# Patient Record
Sex: Female | Born: 1990
Health system: Southern US, Community
[De-identification: ages and names within clinical notes are randomized; demographics above are authoritative.]

## PROBLEM LIST (undated history)

## (undated) DIAGNOSIS — I10 Essential (primary) hypertension: Secondary | ICD-10-CM

## (undated) DIAGNOSIS — J302 Other seasonal allergic rhinitis: Secondary | ICD-10-CM

## (undated) DIAGNOSIS — T7840XA Allergy, unspecified, initial encounter: Secondary | ICD-10-CM

## (undated) DIAGNOSIS — F419 Anxiety disorder, unspecified: Secondary | ICD-10-CM

## (undated) DIAGNOSIS — F509 Eating disorder, unspecified: Secondary | ICD-10-CM

## (undated) DIAGNOSIS — L309 Dermatitis, unspecified: Secondary | ICD-10-CM

## (undated) DIAGNOSIS — R636 Underweight: Secondary | ICD-10-CM

## (undated) DIAGNOSIS — U071 COVID-19: Secondary | ICD-10-CM

## (undated) DIAGNOSIS — L709 Acne, unspecified: Secondary | ICD-10-CM

## (undated) DIAGNOSIS — N912 Amenorrhea, unspecified: Secondary | ICD-10-CM

## (undated) HISTORY — DX: Eating disorder, unspecified: F50.9

## (undated) HISTORY — DX: Other seasonal allergic rhinitis: J30.2

## (undated) HISTORY — DX: Allergy, unspecified, initial encounter: T78.40XA

## (undated) HISTORY — PX: WISDOM TOOTH EXTRACTION: SHX21

## (undated) HISTORY — DX: COVID-19: U07.1

## (undated) HISTORY — DX: Acne, unspecified: L70.9

## (undated) HISTORY — DX: Dermatitis, unspecified: L30.9

## (undated) HISTORY — DX: Underweight: R63.6

## (undated) HISTORY — DX: Anxiety disorder, unspecified: F41.9

## (undated) HISTORY — DX: Amenorrhea, unspecified: N91.2

## (undated) HISTORY — DX: Essential (primary) hypertension: I10

---
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
## 2009-01-24 ENCOUNTER — Telehealth: Payer: Self-pay | Admitting: Family Medicine

## 2009-02-26 ENCOUNTER — Telehealth: Payer: Self-pay | Admitting: Internal Medicine

## 2009-04-24 ENCOUNTER — Telehealth: Payer: Self-pay | Admitting: Family Medicine

## 2009-04-25 ENCOUNTER — Ambulatory Visit: Payer: Self-pay | Admitting: Internal Medicine

## 2009-06-30 ENCOUNTER — Telehealth: Payer: Self-pay | Admitting: Internal Medicine

## 2009-10-06 ENCOUNTER — Telehealth: Payer: Self-pay | Admitting: Family Medicine

## 2009-11-18 ENCOUNTER — Ambulatory Visit: Payer: Self-pay | Admitting: Internal Medicine

## 2009-11-18 ENCOUNTER — Encounter: Payer: Self-pay | Admitting: Family Medicine

## 2009-11-18 ENCOUNTER — Telehealth: Payer: Self-pay | Admitting: Family Medicine

## 2009-11-18 ENCOUNTER — Telehealth: Payer: Self-pay | Admitting: Internal Medicine

## 2009-11-20 LAB — CONVERTED CEMR LAB: Hepatitis B-Post: 84.7 milliintl units/mL

## 2010-01-19 ENCOUNTER — Encounter: Payer: Self-pay | Admitting: Internal Medicine

## 2010-01-20 ENCOUNTER — Encounter: Payer: Self-pay | Admitting: Family Medicine

## 2010-02-03 ENCOUNTER — Ambulatory Visit: Payer: Self-pay | Admitting: Family Medicine

## 2010-04-03 ENCOUNTER — Ambulatory Visit: Payer: Self-pay | Admitting: Family Medicine

## 2010-04-07 ENCOUNTER — Telehealth: Payer: Self-pay | Admitting: Internal Medicine

## 2010-04-25 ENCOUNTER — Telehealth: Payer: Self-pay | Admitting: Family Medicine

## 2010-07-02 ENCOUNTER — Ambulatory Visit: Payer: Self-pay | Admitting: Family Medicine

## 2010-08-11 NOTE — Progress Notes (Signed)
  Phone Note Call from Patient   Caller: Dad Call For: Kerby Nora MD Summary of Call: Patient is coming in today for blood work she needs titers for pharmacy school. He would like to know if you would like any other blood work done at that time also.m She has appt at 11 am Initial call taken by: Benny Lennert CMA Duncan Dull),  Nov 18, 2009 9:38 AM  Follow-up for Phone Call        No other rotuine labs needed at age 20 unless she is having symptoms/problem.  Follow-up by: Kerby Nora MD,  Nov 18, 2009 9:45 AM  Additional Follow-up for Phone Call Additional follow up Details #1::        okay will advise Additional Follow-up by: Benny Lennert CMA Duncan Dull),  Nov 18, 2009 9:45 AM

## 2010-08-11 NOTE — Miscellaneous (Signed)
Summary: PPD  Clinical Lists Changes  Orders: Added new Service order of TB Skin Test [phone]) - Signed Added new Service order of Admin 1st Vaccine (11914) - Signed Added new Service order of Admin 1st Vaccine [facility]) [phone]) - Signed Observations: Added new observation of TB-PPD LOT#: [id number] (01/19/2010 17:42) Added new observation of TB-PPD EXP: 04/24/2011 (01/19/2010 17:42) Added new observation of TB-PPD BY: Tillman Abide (01/19/2010 17:42) Added new observation of TB-PPD RTE: ID (01/19/2010 17:42) Added new observation of TB-PPD MFR: Sanofi Pasteur (01/19/2010 17:42) Added new observation of TB-PPD SITE: left forearm (01/19/2010 17:42) Added new observation of TB-PPD: PPD (01/19/2010 17:42)      Orders Added: 1)  TB Skin Test [86580] 2)  Admin 1st Vaccine [90471] 3)  Admin 1st Vaccine [facility]) [90471S]   PPD placed ub left forearm in evening July 12th Richard Dia Crawford MD  January 21, 2010 10:30 AM   PPD shows 0mm of induration at 48 hours Clearly a negative test Cindee Salt MD  January 22, 2010 10:25 PM

## 2010-08-11 NOTE — Progress Notes (Signed)
  Phone Note Other Incoming   Caller: Father Summary of Call: D/w patient father, has had some URI signs, but now has developed some low grade fever, chills, sinus pain.  Call in to [facility], [facility]. done  [place] - can you call to remind her that OCP's will not work while on Amox. Dr. Alphonsus Sias will have cell #.   Initial call taken by: Hannah Beat MD,  October 06, 2009 2:08 PM  Follow-up for Phone Call        Patient advised.Consuello Masse CMA  Follow-up by: Benny Lennert CMA Duncan Dull),  October 06, 2009 2:21 PM    New/Updated Medications: AMOXICILLIN 875 MG TABS (AMOXICILLIN) 1 by mouth two times a day Prescriptions: AMOXICILLIN 875 MG TABS (AMOXICILLIN) 1 by mouth two times a day  #20 x 0   Entered and Authorized by:   Hannah Beat MD   Signed by:   Hannah Beat MD on 10/06/2009   Method used:   [facility] ...       [facility]  [id number]       [address]       [place], [place]  [address]       Ph: [phone]       Fax: [phone]   RxID:   [id number]

## 2010-08-11 NOTE — Progress Notes (Signed)
Summary: PPD  Phone Note Outgoing Call   Call placed by: Mervin Hack CMA Duncan Dull),  Nov 18, 2009 1:20 PM Call placed to: Patient's father Summary of Call: PPD info for your daughter: date given 11/18/2009 lot# [id number] exp date 04/24/2011 manf: Sanofi Pasteur dose:0.1unit route: left forearm Initial call taken by: Mervin Hack CMA Duncan Dull),  Nov 18, 2009 1:21 PM  Follow-up for Phone Call        PPD negative 0mm of induration Follow-up by: Cindee Salt MD,  Nov 20, 2009 1:07 PM

## 2010-08-11 NOTE — Progress Notes (Signed)
  Phone Note Call from Patient   Caller: Patient Summary of Call: Dr Collins Callas put her on astepro she wants to change to generic astelin because astepro is too expensive Initial call taken by: Cindee Salt MD,  April 07, 2010 9:07 AM    New/Updated Medications: AZELASTINE HCL 137 MCG/SPRAY SOLN (AZELASTINE HCL) 1 spray each nostril two times a day Prescriptions: AZELASTINE HCL 137 MCG/SPRAY SOLN (AZELASTINE HCL) 1 spray each nostril two times a day  #1 x 11   Entered and Authorized by:   Cindee Salt MD   Signed by:   Cindee Salt MD on 04/07/2010   Method used:   Electronically to        [facility] [phone]* (retail)       [address]       [place], [place]  [address]       Ph: [phone]       Fax:    RxID:   [id number]

## 2010-08-11 NOTE — Consult Note (Signed)
Summary: [facility]  [facility]   Imported By: Lanelle Bal 01/28/2010 13:22:56  _____________________________________________________________________  External Attachment:    Type:   Image     Comment:   External Document

## 2010-08-11 NOTE — Progress Notes (Signed)
  Phone Note Call from Patient   Caller: Dad Summary of Call: Tamara Mills has some questions about her anxiety Actually better most of the time and she may do okay on lower dose of venlafaxine She has some anxiety spells that are bad at times---esp at night May be helped with xanax but she has  been out for some time  cell #  [phone] Initial call taken by: Cindee Salt MD,  April 25, 2010 11:43 AM  Follow-up for Phone Call        Was unable to call pt after work on 10/14 as initiallly requested. Heather ..please call pt to let her know I will contact her to discuss concerns today or tommorow, Follow-up by: Kerby Nora MD,  April 27, 2010 7:05 AM  Additional Follow-up for Phone Call Additional follow up Details #1::        Patient advised via message on personal cell phone Additional Follow-up by: Benny Lennert CMA Duncan Dull),  April 27, 2010 9:33 AM    Additional Follow-up for Phone Call Additional follow up Details #2::    Dry mouth SE to 150 of venlafaxine. Interested in acute short term med for anxiety..such as xanax. Helps her to know she has it around. She does not feel like she would use it more that a few times a week. Had considered other medications like wellbutrin.Marland Kitchenbut wants to hold off switching at this time. No SI, no HI.   Will lower venlafaxine to 75 mg daily. Recommended stresss reduction, relaxation techniques. disucssed temporary indication and tolerance concerns of xanax. If escalating use or using frequently will recommend trial of a different medicaiton. Instructed to follow up in 1 month if mood not improving.  Follow-up by: Kerby Nora MD,  April 28, 2010 1:42 PM  Additional Follow-up for Phone Call Additional follow up Details #3:: Details for Additional Follow-up Action Taken: rx called to pharmacy.Consuello Masse CMA   Additional Follow-up by: Benny Lennert CMA Duncan Dull),  April 28, 2010 1:46 PM  New/Updated Medications: VENLAFAXINE HCL 75  MG XR24H-CAP (VENLAFAXINE HCL) 1 tab by mouth daily Prescriptions: ALPRAZOLAM 0.25 MG TABS (ALPRAZOLAM) 1 tab by mouth q6 hurs as needed anxiety  #30 x 0   Entered and Authorized by:   Kerby Nora MD   Signed by:   Kerby Nora MD on 04/28/2010   Method used:   [facility]ph[facility] ...       [facility]  [id number] ([facility]       [address]       [place], [place]  [address]       Ph: [phone]       Fax: [phone]   RxID:   [id number] VENLAFAXINE HCL 75 MG XR24H-CAP (VENLAFAXINE HCL) 1 tab by mouth daily  #30 x 5   Entered and Authorized by:   Kerby Nora MD   Signed by:   Kerby Nora MD on 04/28/2010   Method used:   Telephoned to ...       [facility]  [id number] ([facility]ail)       [address]       [place], [place]  [address]       Ph: [phone]       Fax: [phone]   RxID:   [id number]

## 2010-08-11 NOTE — Assessment & Plan Note (Signed)
Summary: Tamara Mills FLU SHOT/RBH  Nurse Visit   Allergies: No Known Drug Allergies  Immunizations Administered:  Influenza Vaccine # 1:    Vaccine Type: Fluvax 3+    Site: left deltoid    Mfr: GlaxoSmithKline    Dose: 0.5 ml    Route: IM    Given by: Mervin Hack CMA (AAMA)    Exp. Date: 01/09/2011    Lot #: [id number]    VIS given: 02/03/10 version given April 03, 2010.  Flu Vaccine Consent Questions:    Do you have a history of severe allergic reactions to this vaccine? no    Any prior history of allergic reactions to egg and/or gelatin? no    Do you have a sensitivity to the preservative Thimersol? no    Do you have a past history of Guillan-Barre Syndrome? no    Do you currently have an acute febrile illness? no    Have you ever had a severe reaction to latex? no    Vaccine information given and explained to patient? yes    Are you currently pregnant? no  Orders Added: 1)  Flu Vaccine 9yrs + [90658] 2)  Admin 1st Vaccine [09811]

## 2010-08-13 NOTE — Assessment & Plan Note (Signed)
Summary: F/U/CLE   Vital Signs:  Patient profile:   20 year old female Height:      65.12 inches Weight:      128.75 pounds BMI:     21.42 Temp:     98.1 degrees F oral Pulse rate:   60 / minute Pulse rhythm:   regular BP sitting:   100 / 72  (left arm) Cuff size:   regular  Vitals Entered By: Lewanda Rife LPN (July 14, 2010 9:05 AM) CC: follow-up visit med refills   History of Present Illness: here for f/u of chronic med problems incl allergies and and anxiety  (pt denies eating disorder -- was running a lot in the past- then appetite went down after cross country)  had amenorrhea -- then went on OC for a while off of it now -- peroid is starting to come more regularly - 28 days (but very short)   is on effexor currently--helping a lot  tried to go back down to 75 for a while - then much better on the higher dose  for now she wants to stay on that dose  also alprazolam as needed   no panic attacks  has not seen a counselor  tried some self help books   not a stress issue for the most part --this predated going back to school    wt is up 20 lb  is very happy with that   is doing well  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  has boyfriend -- he lives in [place] -- is going well   had gyn exam when she had amenorrhea - last summer   needs refil of retin A -- wants cream instead of gel       Allergies (verified): No Known Drug Allergies  Past History:  Family History: Last updated: 12/25/2007 father: healthy mother: healthy brother: Crohn's  Social History: Last updated: 07/14/2010 Single Never Smoked Alcohol use-no Drug use-no 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 Eyes:  Denies blurring and eye irritation. CV:  Denies chest pain or discomfort, palpitations, and shortness of breath with exertion. Resp:  Denies cough, shortness of breath, and wheezing. GI:  Denies abdominal pain, change in bowel habits, indigestion, and nausea. GU:  Denies abnormal vaginal bleeding and urinary frequency. MS:  Denies joint pain, joint redness, and joint swelling. Derm:  Denies itching, lesion(s), poor wound healing, and rash. Neuro:  Denies numbness and tingling. Psych:  Denies anxiety and depression. Endo:  Denies cold intolerance, excessive thirst, excessive urination, and heat intolerance. Heme:  Denies abnormal bruising and bleeding.  Physical Exam  General:  slim and well appearing  Head:  normocephalic, atraumatic, and no abnormalities observed.   Eyes:  vision grossly intact, pupils equal, pupils round, and pupils reactive to light.  no conjunctival pallor, injection or icterus  Ears:  R ear normal and L ear normal.   Mouth:  pharynx pink and moist.   Neck:  supple with full rom and no masses  or thyromegally, no JVD or carotid bruit  Chest Wall:  No deformities, masses, or tenderness noted. Lungs:  Normal respiratory effort, chest expands symmetrically. Lungs are clear to auscultation, no crackles or wheezes. Heart:  Normal rate and regular rhythm. S1 and S2 normal without gallop, murmur, click, rub or other extra sounds. Abdomen:  Bowel sounds positive,abdomen soft and non-tender without masses, organomegaly or hernias noted. no renal bruits  Msk:  No deformity or scoliosis noted of thoracic or lumbar spine.  no acute joint changes  Pulses:  R and L carotid,radial,femoral,dorsalis pedis and posterior tibial pulses are full and equal  bilaterally Extremities:  No clubbing, cyanosis, edema, or deformity noted with normal full range of motion of all joints.   Neurologic:  sensation intact to light touch, gait normal, and DTRs symmetrical and normal.   Skin:  Intact without suspicious lesions or rashes mild comedonal acne on cheeks and chin Cervical Nodes:  No lymphadenopathy noted Inguinal Nodes:  No significant adenopathy Psych:  normal affect, talkative and pleasant  quite cheerful and energetic    Impression & Recommendations:  Problem # 1:  ANXIETY STATE NOS (ICD-300.00)  much improved on effexor -continue current dose handling stressors well  px refilled  The following medications were removed from the medication list:    Venlafaxine Hcl 75 Mg Xr24h-cap (Venlafaxine hcl) .Marland Kitchen... 1 tab by mouth daily Her updated medication list for this problem includes:    Alprazolam 0.25 Mg Tabs (Alprazolam) .Marland Kitchen... 1 tab by mouth q6 hurs as needed anxiety    Venlafaxine Hcl 150 Mg Xr24h-tab (Venlafaxine hcl) .Marland Kitchen... Take 1 tablet by mouth once a day  Orders: Prescription Created Electronically [phone])  Problem # 2:  AMENORRHEA, SECONDARY (ICD-626.0) Assessment: Improved  this is resolved with wt gain- will continue to monitor  The following medications were removed from the medication list:    Prometrium 200 Mg Caps (Progesterone micronized) .Marland Kitchen... 2 tab po daily x 10 days    Ortho-novum 1/35 (28) 1-35 Mg-mcg Tabs (Norethindrone-eth estradiol) .Marland Kitchen... 1 tab by mouth daily  Orders: Prescription Created Electronically [phone])  Problem # 3:  ACNE VULGARIS (ICD-706.1) Assessment: Unchanged will continue retin A - but cream instead of gel -- due to irritation adv to avoid sun  Her updated medication list for this problem includes:    Retin-a 0.025 % Crea (Tretinoin) .Marland Kitchen... Apply to affected areas once daily as directed  Complete Medication List: 1)  Alprazolam 0.25 Mg Tabs (Alprazolam) .Marland Kitchen.. 1 tab by mouth q6 hurs as needed  anxiety 2)  Retin-a 0.025 % Crea (Tretinoin) .... Apply to affected areas once daily as directed 3)  Azelastine Hcl 137 Mcg/spray Soln (Azelastine hcl) .Marland Kitchen.. 1 spray each nostril two times a day 4)  Allegra-d Allergy & Congestion 60-120 Mg Xr12h-tab (Fexofenadine-pseudoephedrine) .... Otc as directed. 5)  Tylenol Extra Strength 500 Mg Tabs (Acetaminophen) .... Otc as directed. 6)  Venlafaxine Hcl 150 Mg Xr24h-tab (Venlafaxine hcl) .... Take 1 tablet by mouth once a day  Patient Instructions: 1)  I'm glad you are doing well and you have gained weight  2)  keep up the good health habits  3)  follow up with Lanai Conlee in 6 months  4)  I sent meds to your pharmacy  Prescriptions: VENLAFAXINE HCL 150 MG XR24H-TAB (VENLAFAXINE HCL) Take 1 tablet by mouth once a day  #30 x 11   Entered and Authorized by:   Judith Part MD   Signed by:   [facility]  MD on 07/14/2010   Method used:   [facility]        [id number]       [address]       [place], [place]  [address]       Ph: [phone]       Fax: [phone]   RxID:   [id number] RETIN-A 0.025 % CREA (TRETINOIN) apply to affected areas once daily as directed  #1 large x 11   Entered and Authorized by:   Judith Part MD   Signed by:   Judith Part MD on 07/14/2010   Method used:   [facility]        [id number] ([facility]ail[facility]       [address]       [place], [place]  [address]       Ph: [phone]       Fax: [phone]   RxID:   [id number]    Orders Added: 1)  Prescription Created Electronically [G8553] 2)  Est. Patient Level IV [57322]    Current Allergies (reviewed today): No known allergies

## 2010-11-27 NOTE — Assessment & Plan Note (Signed)
[facility]                                   ON-CALL NOTE   Tamara Mills, Tamara Mills                        MRN:          [id number]  DATE:05/08/2006                            DOB:          [birth year]    OUT OF OFFICE CONSULTATION:  Matricia is a patient of the practice and my  daughter. 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 She has been having continuing foot pain which got  more severe after this race and prevented her from doing more running and  further training. The examination showed point tenderness over the second  metatarsals bilaterally. While being fairly sure this is just a mechanical  thing, x-rays were ordered which were negative. We are using a regimen of  ice after running, she has gotten wider shoes to get better support for her  feet and will continue antiinflammatories.    ______________________________  Karie Schwalbe, MD    RIL/MedQ  DD: 05/11/2006  DT: 05/11/2006  Job #: [id number]

## 2010-11-27 NOTE — Assessment & Plan Note (Signed)
[facility]                                 ON-CALL NOTE   BLISS, BEHNKE                          MRN:          [id number]  DATE:07/07/2006                            DOB:          [birth year]    Jewelia is my daughter; she has been using Retin-A for some acne which has  not been doing the job.  She now, while the Retin-A has dried her out,  has more of an inflammatory component.  I reviewed the situation with  her doctor, Dr. Milinda Antis, told her that my son had great success with  Doxycycline and asked whether that would be acceptable to her.  She that  would be fine so I am phoning it in to [facility], 100 b.i.d. number 30  with p.r.n. refills, Dr. Milinda Antis did mention to watch for the  photosensitivity and occasionally some people have dizziness.     Karie Schwalbe, MD  Electronically Signed    RIL/MedQ  DD: 07/07/2006  DT: 07/07/2006  Job #: [id number]

## 2011-01-23 ENCOUNTER — Encounter: Payer: Self-pay | Admitting: Family Medicine

## 2011-01-27 ENCOUNTER — Ambulatory Visit (INDEPENDENT_AMBULATORY_CARE_PROVIDER_SITE_OTHER): Payer: BC Managed Care – PPO | Admitting: Family Medicine

## 2011-01-27 ENCOUNTER — Encounter: Payer: Self-pay | Admitting: Family Medicine

## 2011-01-27 VITALS — BP 104/68 | HR 60 | Temp 98.4°F | Ht 65.5 in | Wt 135.8 lb

## 2011-01-27 DIAGNOSIS — N912 Amenorrhea, unspecified: Secondary | ICD-10-CM

## 2011-01-27 DIAGNOSIS — R159 Full incontinence of feces: Secondary | ICD-10-CM | POA: Insufficient documentation

## 2011-01-27 NOTE — Assessment & Plan Note (Signed)
Recurrent - without obesity/ underweight this time Some mild hair growth on her face  No pain  Lab today If neg - will put back on yaz for 6 months

## 2011-01-27 NOTE — Patient Instructions (Addendum)
Labs today for amenorrhea  Will update you  If all is normal will start yaz (lets see what labs look like too) Follow up in 6 months  Rectal exam looks normal today with no blood and anoscopy normal  If symptoms worsen or persist - call so I can set up a surgical referral

## 2011-01-27 NOTE — Assessment & Plan Note (Signed)
Without bleeding or incontinence or stool changes or pain  Pt thinks she may have some sort of prolapse at times Drainage is thin and clear  Nl exam and guiac and anoscopy today and no peri anal/ pineal cysts or other findings  ? If hemorroid not seen or prolapse or fistula Next step would be ref to surgeon -- pt will update me if she wants to do that

## 2011-01-27 NOTE — Progress Notes (Signed)
Subjective:    Patient ID: Tamara Mills, female    DOB: [birth year], 20 y.o.   MRN: [id number]  HPI Here to disc menses and rectal drainage  peroids were normal without birth control for 2-3 months  ? Why it stopped again  Gained some weight -- is happy about that   No peroid since mid April -normal to heavy Spotting for 2 days  Then nothing  No cramps or bloating  Not pregnant  Not sexually active at all   Feels like she is at a normal weight   Broke up with her boyfriend -- was in [place] - for the best  Not upset or stressed about this at all   Saw obgyn a year ago -- was told to go on the OC She is worried about fertility in the future - and why her menses are so irregular   Gets a little acne Less stressed lately  Mood has been good - has not needed xanax  Does not get ovarian cysts  Not overweight   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 Rectal prolapse  For about a year Not painful Drains a bit  ? If hemorroids - no bleeding and no pain  occ have to re-insert  Drainage - is clear and a little mucous   Patient Active Problem List  Diagnoses  . ANXIETY STATE NOS  . AMENORRHEA, SECONDARY  . ACNE VULGARIS  . ACHILLES TENDINITIS  . DEFORMITY, CAVUS, FOOT, ACQUIRED  . Amenorrhea  . Rectal leakage   Past Medical History  Diagnosis Date  . Anxiety   . Amenorrhea     Hx of  . Underweight     Hx of  . Acne   . Hand eczema   . Eating disorder     ? eating disorder in past ( pt states just running too much)   No past surgical history on file. History  Substance Use Topics  . Smoking status: Never Smoker   . Smokeless tobacco: Not on file  . Alcohol Use: No   Family History  Problem Relation Age of Onset  . Crohn's disease Brother    Allergies  Allergen Reactions  . Nickel Rash   Current Outpatient Prescriptions on File Prior to  Visit  Medication Sig Dispense Refill  . azelastine (ASTELIN) 137 MCG/SPRAY nasal spray Place 1 spray into the nose 2 (two) times daily. Use in each nostril as directed      . tretinoin (RETIN-A) 0.025 % cream Apply topically to affected areas once daily as directed.       . venlafaxine (EFFEXOR-XR) 150 MG 24 hr capsule Take 150 mg by mouth daily.        Marland Kitchen ALPRAZolam (XANAX) 0.25 MG tablet Take 0.25 mg by mouth every 6 (six) hours as needed.              Review of Systems Review of Systems  Constitutional: Negative for fever, appetite change, fatigue and unexpected weight change.  Eyes: Negative for pain and visual disturbance.  Respiratory: Negative for cough and shortness of breath.   Cardiovascular: Negative for cp or sob or edema or palpitations.   Gastrointestinal: Negative for nausea, diarrhea and constipation.  Genitourinary: Negative for urgency and frequency.  Skin: Negative for pallor.or rash/ some mild acne (OC does  help this ) , some hair growth on the face that is very light   Neurological: Negative for weakness, light-headedness, numbness and headaches.  Hematological: Negative for adenopathy. Does not bruise/bleed easily.  Psychiatric/Behavioral: Negative for dysphoric mood. The patient is not nervous/anxious.          Objective:   Physical Exam  Constitutional: She appears well-nourished. No distress.  HENT:  Head: Normocephalic and atraumatic.  Mouth/Throat: Oropharynx is clear and moist.  Eyes: Conjunctivae and EOM are normal. Pupils are equal, round, and reactive to light.  Neck: Normal range of motion. Neck supple. No JVD present. No thyromegaly present.  Cardiovascular: Normal rate, regular rhythm, normal heart sounds and intact distal pulses.   Pulmonary/Chest: Effort normal and breath sounds normal. No respiratory distress. She has no wheezes.  Abdominal: Soft. Bowel sounds are normal. She exhibits no distension and no mass. There is no tenderness.        No suprapubic tenderness    Genitourinary: Rectum normal. Rectal exam shows no external hemorrhoid, no internal hemorrhoid, no fissure, no mass, no tenderness and anal tone normal. Guaiac negative stool.       Nl anal tone No abn seen on rectal exam or anoscopy whatsoever No rectal prolapse on valsalva  Musculoskeletal: She exhibits no edema and no tenderness.  Lymphadenopathy:    She has no cervical adenopathy.  Neurological: She is alert. She has normal reflexes. Coordination normal.  Skin: Skin is warm and dry. No rash noted. No erythema. No pallor.       Mild facial acne No facial hair growth noted today  Psychiatric: She has a normal mood and affect.       Talkative and pleasant           Assessment & Plan:

## 2011-01-28 ENCOUNTER — Encounter: Payer: Self-pay | Admitting: *Deleted

## 2011-01-28 ENCOUNTER — Other Ambulatory Visit: Payer: Self-pay | Admitting: *Deleted

## 2011-01-28 LAB — LUTEINIZING HORMONE: LH: 4.23 m[IU]/mL

## 2011-01-28 LAB — FOLLICLE STIMULATING HORMONE: FSH: 7.6 m[IU]/mL

## 2011-01-28 MED ORDER — DROSPIRENONE-ETHINYL ESTRADIOL 3-0.02 MG PO TABS: 1.0000 | ORAL_TABLET | Freq: Every day | ORAL | Status: DC

## 2011-01-28 NOTE — Telephone Encounter (Signed)
rx sent to pharmacy by e-script  

## 2011-04-22 ENCOUNTER — Ambulatory Visit (INDEPENDENT_AMBULATORY_CARE_PROVIDER_SITE_OTHER): Payer: BC Managed Care – PPO

## 2011-04-22 DIAGNOSIS — Z23 Encounter for immunization: Secondary | ICD-10-CM

## 2011-04-27 DIAGNOSIS — Z23 Encounter for immunization: Secondary | ICD-10-CM

## 2011-06-10 ENCOUNTER — Other Ambulatory Visit: Payer: Self-pay | Admitting: Family Medicine

## 2011-08-05 ENCOUNTER — Other Ambulatory Visit: Payer: Self-pay | Admitting: Family Medicine

## 2011-08-05 NOTE — Telephone Encounter (Signed)
Will refill electronically I think she is due for a 6 month f/u -- please schedule this winter or spring

## 2011-08-16 NOTE — Telephone Encounter (Signed)
Spoke with pt, she will call back to schedule after looking at her schedule.

## 2011-09-20 ENCOUNTER — Ambulatory Visit (INDEPENDENT_AMBULATORY_CARE_PROVIDER_SITE_OTHER): Payer: BC Managed Care – PPO | Admitting: Family Medicine

## 2011-09-20 ENCOUNTER — Encounter: Payer: Self-pay | Admitting: Family Medicine

## 2011-09-20 VITALS — BP 118/78 | HR 64 | Temp 98.1°F | Ht 65.5 in | Wt 160.8 lb

## 2011-09-20 DIAGNOSIS — F411 Generalized anxiety disorder: Secondary | ICD-10-CM

## 2011-09-20 DIAGNOSIS — N912 Amenorrhea, unspecified: Secondary | ICD-10-CM

## 2011-09-20 DIAGNOSIS — Z111 Encounter for screening for respiratory tuberculosis: Secondary | ICD-10-CM

## 2011-09-20 NOTE — Patient Instructions (Signed)
I have no concerns about menses at this time  If you are concerned about fertility in future - try basal body temperature  Stick with effexor-but if you want to change let me know  TB skin test today

## 2011-09-20 NOTE — Assessment & Plan Note (Signed)
Disc pros/cons of Effexor vs Prozac in detail Will stick with Effexor since it is working well with minimal side eff Is compliant with dosing Disc use of antiperspirants for sweating (certain dry)  Will let me know if she changes her mind

## 2011-09-20 NOTE — Progress Notes (Signed)
Subjective:    Patient ID: Tamara Mills, female    DOB: 04/28/1991, 21 y.o.   MRN: [id number]  HPI Here to discuss medicines  Went off her OC - for irregular cycles and also acne Since then has had regular -- but last just about 2 days or so (is quite heavy)- not really crampy  Cycles are closer to 21 days - but still come on their own    Wt is up 25 lb= knows that her weight plays   Is doing tae bo and some running (less than she was)  More realistic exercise   Is not trying to get pregnant at this time   Is on spring break right now  School is going ok overal   Had an isolated inc bp at home But normal now  Also some increased sweating  No other issues  Has to take it at the right time- and not miss doses  Was considering trying prozac for long half life  Has not taken xanax in about a year   Patient Active Problem List  Diagnoses  . ANXIETY STATE NOS  . AMENORRHEA, SECONDARY  . ACNE VULGARIS  . ACHILLES TENDINITIS  . DEFORMITY, CAVUS, FOOT, ACQUIRED  . Amenorrhea  . Rectal leakage   Past Medical History  Diagnosis Date  . Anxiety   . Amenorrhea     Hx of  . Underweight     Hx of  . Acne   . Hand eczema   . Eating disorder     ? eating disorder in past ( pt states just running too much)   No past surgical history on file. History  Substance Use Topics  . Smoking status: Never Smoker   . Smokeless tobacco: Not on file  . Alcohol Use: No   Family History  Problem Relation Age of Onset  . Crohn's disease Brother    Allergies  Allergen Reactions  . Nickel Rash   Current Outpatient Prescriptions on File Prior to Visit  Medication Sig Dispense Refill  . azelastine (ASTELIN) 137 MCG/SPRAY nasal spray 1 SPRAY EACH NOSTRIL TWO TIMES A DAY  30 mL  6  . tretinoin (RETIN-A) 0.025 % cream Apply topically to affected areas once daily as directed.       Marland Kitchen ALPRAZolam (XANAX) 0.25 MG tablet Take 0.25 mg by mouth every 6 (six) hours as needed.        Marland Kitchen  ibuprofen (ADVIL,MOTRIN) 200 MG tablet Take 600 mg by mouth every 6 (six) hours as needed.        . Probiotic Product (PROBIOTIC FORMULA PO) Take 1 tablet by mouth daily.        . Venlafaxine HCl 150 MG TB24 TAKE 1 TABLET EVERY DAY  30 tablet  5       Review of Systems Review of Systems  Constitutional: Negative for fever, appetite change, fatigue and unexpected weight change.  Eyes: Negative for pain and visual disturbance.  Respiratory: Negative for cough and shortness of breath.   Cardiovascular: Negative for cp or palpitations    Gastrointestinal: Negative for nausea, diarrhea and constipation.  Genitourinary: Negative for urgency and frequency.  Skin: Negative for pallor or rash   Neurological: Negative for weakness, light-headedness, numbness and headaches.  Hematological: Negative for adenopathy. Does not bruise/bleed easily.  Psychiatric/Behavioral: Negative for dysphoric mood. The patient is not nervous/anxious.          Objective:   Physical Exam  Constitutional: She appears well-developed and  well-nourished. No distress.  HENT:  Head: Normocephalic and atraumatic.  Mouth/Throat: Oropharynx is clear and moist.  Eyes: Conjunctivae and EOM are normal. Pupils are equal, round, and reactive to light.  Neck: Normal range of motion. Neck supple. No thyromegaly present.  Cardiovascular: Normal rate, regular rhythm and normal heart sounds.   Pulmonary/Chest: Effort normal and breath sounds normal. No respiratory distress. She has no wheezes.  Abdominal: Soft. Bowel sounds are normal. She exhibits no distension and no mass. There is no tenderness.       No suprapubic tenderness    Musculoskeletal: She exhibits no edema.  Lymphadenopathy:    She has no cervical adenopathy.  Neurological: She is alert. She has normal reflexes.  Skin: Skin is warm and dry. No pallor.  Psychiatric: She has a normal mood and affect.       Cheerful and talkative           Assessment & Plan:

## 2011-09-20 NOTE — Assessment & Plan Note (Signed)
This has resolved with wt gain Pt had questions about fertility- given nl cycles (though short)- I am not worried Ok off OC

## 2011-09-22 LAB — TB SKIN TEST: Induration: 0

## 2012-02-10 ENCOUNTER — Other Ambulatory Visit: Payer: Self-pay | Admitting: Family Medicine

## 2012-02-10 NOTE — Telephone Encounter (Signed)
Will refill electronically  

## 2012-02-10 NOTE — Telephone Encounter (Signed)
Request for Venlafaxine 150 mg #30. Ok to refill?

## 2012-03-31 ENCOUNTER — Ambulatory Visit (INDEPENDENT_AMBULATORY_CARE_PROVIDER_SITE_OTHER): Payer: BC Managed Care – PPO

## 2012-03-31 DIAGNOSIS — Z23 Encounter for immunization: Secondary | ICD-10-CM

## 2012-04-28 ENCOUNTER — Encounter: Payer: Self-pay | Admitting: Family Medicine

## 2012-04-28 ENCOUNTER — Ambulatory Visit (INDEPENDENT_AMBULATORY_CARE_PROVIDER_SITE_OTHER): Payer: BC Managed Care – PPO | Admitting: Family Medicine

## 2012-04-28 VITALS — BP 112/74 | HR 66 | Temp 98.0°F | Ht 65.5 in | Wt 154.2 lb

## 2012-04-28 DIAGNOSIS — F411 Generalized anxiety disorder: Secondary | ICD-10-CM

## 2012-04-28 DIAGNOSIS — L700 Acne vulgaris: Secondary | ICD-10-CM

## 2012-04-28 DIAGNOSIS — L708 Other acne: Secondary | ICD-10-CM

## 2012-04-28 DIAGNOSIS — B079 Viral wart, unspecified: Secondary | ICD-10-CM

## 2012-04-28 MED ORDER — DULOXETINE HCL 60 MG PO CPEP: 60.0000 mg | ORAL_CAPSULE | Freq: Every day | ORAL | Status: DC

## 2012-04-28 MED ORDER — MINOCYCLINE HCL 100 MG PO CAPS: 100.0000 mg | ORAL_CAPSULE | Freq: Two times a day (BID) | ORAL | Status: DC

## 2012-04-28 NOTE — Patient Instructions (Addendum)
Change from effexor to cymbalta 60 mg tomorrow  Let me know if any problems  Start minocin as directed  Follow up with me in 3 months  Keep warts clean and antibiotic ointment for a week or so is ok

## 2012-04-28 NOTE — Progress Notes (Signed)
Subjective:    Patient ID: Tamara Mills, female    DOB: Sep 11, 1990, 21 y.o.   MRN: [id number]  HPI Is doing well working and TA , and pharmacy school   Has been feeling pretty good except for her anxiety  effexor not working as well and duloxetine (cymbalta)  Is interested in trying it   Thinks she gained some side eff with effexor- dry mouth and also sweating  Also stays keyed up  No chronic pain issues   Lots of family things going on as well as work and school Has a very good support system   Is getting enough sleep Eats regular meals  Eats very healthy  Works out regularly   Does get a period every months - not always very regular - but not bad   Acne is worse lately too -- inflammatory Using retin A and acne cleanser every day (salycilic acid) Has not changed makeup or other products  Has in general oily to combination skin -- this is stable  Stress may play a role  Does not think she was that much better on OC -- really not entirely sure Does not correlate much with menses   Also has some warts on thumb she would like treated today  They are sore if traumatized No bleeding   Patient Active Problem List  Diagnosis  . ANXIETY STATE NOS  . AMENORRHEA, SECONDARY  . ACNE VULGARIS  . ACHILLES TENDINITIS  . DEFORMITY, CAVUS, FOOT, ACQUIRED  . Rectal leakage  . Acne vulgaris  . Viral wart on right thumb   Past Medical History  Diagnosis Date  . Anxiety   . Amenorrhea     Hx of  . Underweight     Hx of  . Acne   . Hand eczema   . Eating disorder     ? eating disorder in past ( pt states just running too much)   No past surgical history on file. History  Substance Use Topics  . Smoking status: Never Smoker   . Smokeless tobacco: Not on file  . Alcohol Use: Yes     occasional wine   Family History  Problem Relation Age of Onset  . Crohn's disease Brother    Allergies  Allergen Reactions  . Nickel Rash   Current Outpatient Prescriptions on File  Prior to Visit  Medication Sig Dispense Refill  . ALPRAZolam (XANAX) 0.25 MG tablet Take 0.25 mg by mouth every 6 (six) hours as needed.        Marland Kitchen azelastine (ASTELIN) 137 MCG/SPRAY nasal spray       . fexofenadine (ALLEGRA) 180 MG tablet Take 180 mg by mouth daily.      Marland Kitchen ibuprofen (ADVIL,MOTRIN) 200 MG tablet Take 600 mg by mouth every 6 (six) hours as needed.        . tretinoin (RETIN-A) 0.025 % cream Apply topically to affected areas once daily as directed.       . DULoxetine (CYMBALTA) 60 MG capsule Take 1 capsule (60 mg total) by mouth daily.  30 capsule  11  . DISCONTD: drospirenone-ethinyl estradiol (YAZ) 3-0.02 MG per tablet Take 1 tablet by mouth daily.  28 tablet  11      Patient Active Problem List  Diagnosis  . ANXIETY STATE NOS  . AMENORRHEA, SECONDARY  . ACNE VULGARIS  . ACHILLES TENDINITIS  . DEFORMITY, CAVUS, FOOT, ACQUIRED  . Rectal leakage   Past Medical History  Diagnosis Date  . Anxiety   .  Amenorrhea     Hx of  . Underweight     Hx of  . Acne   . Hand eczema   . Eating disorder     ? eating disorder in past ( pt states just running too much)   No past surgical history on file. History  Substance Use Topics  . Smoking status: Never Smoker   . Smokeless tobacco: Not on file  . Alcohol Use: Yes     occasional wine   Family History  Problem Relation Age of Onset  . Crohn's disease Brother    Allergies  Allergen Reactions  . Nickel Rash   Current Outpatient Prescriptions on File Prior to Visit  Medication Sig Dispense Refill  . ALPRAZolam (XANAX) 0.25 MG tablet Take 0.25 mg by mouth every 6 (six) hours as needed.        . fexofenadine (ALLEGRA) 180 MG tablet Take 180 mg by mouth daily.      Marland Kitchen ibuprofen (ADVIL,MOTRIN) 200 MG tablet Take 600 mg by mouth every 6 (six) hours as needed.        . tretinoin (RETIN-A) 0.025 % cream Apply topically to affected areas once daily as directed.       . Venlafaxine HCl 150 MG TB24 TAKE 1 TABLET EVERY DAY  30  tablet  11  . DISCONTD: azelastine (ASTELIN) 137 MCG/SPRAY nasal spray 1 SPRAY EACH NOSTRIL TWO TIMES A DAY  30 mL  6  . DISCONTD: drospirenone-ethinyl estradiol (YAZ) 3-0.02 MG per tablet Take 1 tablet by mouth daily.  28 tablet  11     Review of Systems     Objective:   Physical Exam  Constitutional: She appears well-developed and well-nourished. No distress.  HENT:  Head: Normocephalic and atraumatic.  Mouth/Throat: Oropharynx is clear and moist.  Eyes: Conjunctivae normal and EOM are normal. Pupils are equal, round, and reactive to light. No scleral icterus.  Neck: Normal range of motion. Neck supple. No JVD present. Carotid bruit is not present. No thyromegaly present.  Cardiovascular: Normal rate, regular rhythm, normal heart sounds and intact distal pulses.  Exam reveals no gallop.   Pulmonary/Chest: Effort normal and breath sounds normal. No respiratory distress. She has no wheezes.  Abdominal: Soft. Bowel sounds are normal. She exhibits no distension and no mass. There is no tenderness.  Musculoskeletal: She exhibits no edema and no tenderness.  Lymphadenopathy:    She has no cervical adenopathy.  Neurological: She is alert. She has normal reflexes. She displays no tremor. She exhibits normal muscle tone. Coordination normal.  Skin: Skin is warm and dry. No rash noted. No erythema. No pallor.  Psychiatric: She has a normal mood and affect.          Assessment & Plan:

## 2012-04-30 NOTE — Assessment & Plan Note (Signed)
Overall fairly controlled but is slt worse with stressors lately  Some side eff with effexor also Will try a different snri- cymbalta Disc pros/ cons and poss side eff Overall coping skills are good F/u planned

## 2012-04-30 NOTE — Assessment & Plan Note (Signed)
Already on retin A but getting worse with some pustules and inflammatory lesions Will try minocin oral treatment  F/u planned Disc cleansing and products that she uses Warned not to touch her face

## 2012-04-30 NOTE — Assessment & Plan Note (Signed)
Several warts treated on R thumb with debridement and cryo tx  Pt tolerated well Disc after care Will re treat if necessary if not resolved next time

## 2012-05-23 ENCOUNTER — Telehealth: Payer: Self-pay | Admitting: Family Medicine

## 2012-05-23 MED ORDER — NORGESTIM-ETH ESTRAD TRIPHASIC 0.18/0.215/0.25 MG-35 MCG PO TABS: 1.0000 | ORAL_TABLET | Freq: Every day | ORAL | Status: DC

## 2012-05-23 NOTE — Telephone Encounter (Signed)
Pt is interested in trying a different OC for acne Will send [facility]

## 2012-05-29 ENCOUNTER — Telehealth: Payer: Self-pay

## 2012-05-29 NOTE — Telephone Encounter (Signed)
60 mg is the top dose of cymbalta  Did the effexor previous to this work better or worse?

## 2012-05-29 NOTE — Telephone Encounter (Signed)
Pt said needs to increase dosage on Cymbalta or change to different med; Anxiety is not controlled and exams are coming up. Pt would prefer to increase Cymbalta if possible. [facility].Please advise.

## 2012-05-30 MED ORDER — VENLAFAXINE HCL ER 150 MG PO CP24: 150.0000 mg | ORAL_CAPSULE | Freq: Every day | ORAL | Status: DC

## 2012-05-30 NOTE — Telephone Encounter (Signed)
Left voicemail requesting pt to call office, will try to call back later 

## 2012-05-30 NOTE — Telephone Encounter (Signed)
Pt left v/m requesting call back [phone].

## 2012-05-30 NOTE — Telephone Encounter (Signed)
Pt said she wants to stay on the effexor since she has been on that before, needs Rx sent to pharm.

## 2012-05-30 NOTE — Telephone Encounter (Signed)
Pt said Effexor worked better but had more side effects primarily increased sweating and dry mouth.Please advise.

## 2012-05-30 NOTE — Telephone Encounter (Signed)
Going to something new will be tricky because both of these medicines are SNRIs and can have some withdrawal symptoms, which would not be good during exam time for sure If effexor worked better - I would advise for now returning to it just to get through exams (dealing with side effects) and then making an alternative plan when the stressful time is over  One more option would be changing to pristiq (another SNRI) Let me know which option she wants to pursue

## 2012-05-30 NOTE — Telephone Encounter (Signed)
Pt.notified

## 2012-05-30 NOTE — Telephone Encounter (Signed)
Left voicemail requesting pt to call office, will try to call later 

## 2012-05-30 NOTE — Telephone Encounter (Signed)
Ok, keep me posted/ get through exams (good luck) and let  me know if you want to change after that  I sent the effexor xr 150 to her pharmacy [facility]

## 2012-09-18 ENCOUNTER — Ambulatory Visit (INDEPENDENT_AMBULATORY_CARE_PROVIDER_SITE_OTHER): Payer: BC Managed Care – PPO | Admitting: Family Medicine

## 2012-09-18 ENCOUNTER — Encounter: Payer: Self-pay | Admitting: Family Medicine

## 2012-09-18 VITALS — BP 104/66 | HR 64 | Temp 98.8°F | Ht 65.5 in | Wt 149.2 lb

## 2012-09-18 DIAGNOSIS — L700 Acne vulgaris: Secondary | ICD-10-CM

## 2012-09-18 DIAGNOSIS — B079 Viral wart, unspecified: Secondary | ICD-10-CM

## 2012-09-18 DIAGNOSIS — L708 Other acne: Secondary | ICD-10-CM

## 2012-09-18 DIAGNOSIS — F411 Generalized anxiety disorder: Secondary | ICD-10-CM

## 2012-09-18 MED ORDER — MINOCYCLINE HCL 100 MG PO CAPS: 100.0000 mg | ORAL_CAPSULE | Freq: Two times a day (BID) | ORAL | Status: DC

## 2012-09-18 MED ORDER — TRETINOIN 0.025 % EX CREA: TOPICAL_CREAM | CUTANEOUS | Status: DC

## 2012-09-18 NOTE — Progress Notes (Signed)
Subjective:    Patient ID: Tamara Mills, female    DOB: 1991-06-19, 22 y.o.   MRN: [id number]  HPI Here for f/u of chronic medical problems   On minocin for acne So much better ! - really pleased , some superficial scarring  Using benzoyl peroxide and also retin A   Tried cymbalta for anxiety and it did not work as well - she had to switch back Went back to [employer]- she can deal with side effects bp is good  Wt is down 5 lb - not gaining weight  Stress- school, exams -- all going very well  This semester is all group projects   She is no longer taking the OC -- it gave her nausea in the am  She was on yaz and was just on and then ortho tri cyclen  peroids are coming irregular but coming   Also wart came back- is better than it was  Needs that re treated  Not as big 2 warts at the base of R thumb nail   Patient Active Problem List  Diagnosis  . ANXIETY STATE NOS  . AMENORRHEA, SECONDARY  . ACHILLES TENDINITIS  . DEFORMITY, CAVUS, FOOT, ACQUIRED  . Rectal leakage  . Acne vulgaris  . Viral wart on right thumb   Past Medical History  Diagnosis Date  . Anxiety   . Amenorrhea     Hx of  . Underweight     Hx of  . Acne   . Hand eczema   . Eating disorder     ? eating disorder in past ( pt states just running too much)   No past surgical history on file. History  Substance Use Topics  . Smoking status: Never Smoker   . Smokeless tobacco: Not on file  . Alcohol Use: Yes     Comment: occasional wine   Family History  Problem Relation Age of Onset  . Crohn's disease Brother    Allergies  Allergen Reactions  . Cymbalta (Duloxetine Hcl)     Not effective  . Nickel Rash   Current Outpatient Prescriptions on File Prior to Visit  Medication Sig Dispense Refill  . fexofenadine (ALLEGRA) 180 MG tablet Take 180 mg by mouth daily.      Marland Kitchen ibuprofen (ADVIL,MOTRIN) 200 MG tablet Take 600 mg by mouth every 6 (six) hours as needed.        . venlafaxine XR (EFFEXOR XR)  150 MG 24 hr capsule Take 1 capsule (150 mg total) by mouth daily.  30 capsule  11  . [DISCONTINUED] drospirenone-ethinyl estradiol (YAZ) 3-0.02 MG per tablet Take 1 tablet by mouth daily.  28 tablet  11   No current facility-administered medications on file prior to visit.     Review of Systems Review of Systems  Constitutional: Negative for fever, appetite change, fatigue and unexpected weight change.  Eyes: Negative for pain and visual disturbance.  Respiratory: Negative for cough and shortness of breath.   Cardiovascular: Negative for cp or palpitations    Gastrointestinal: Negative for nausea, diarrhea and constipation.  Genitourinary: Negative for urgency and frequency.  Skin: Negative for pallor or rash  acne is improved Neurological: Negative for weakness, light-headedness, numbness and headaches.  Hematological: Negative for adenopathy. Does not bruise/bleed easily.  Psychiatric/Behavioral: Negative for dysphoric mood. Anxiety is improved.         Objective:   Physical Exam  Constitutional: She appears well-nourished. No distress.  HENT:  Head: Normocephalic and atraumatic.  Mouth/Throat: Oropharynx is clear and moist.  Eyes: Conjunctivae and EOM are normal. Pupils are equal, round, and reactive to light. Right eye exhibits no discharge. Left eye exhibits no discharge. No scleral icterus.  Neck: Normal range of motion. Neck supple. No JVD present. No thyromegaly present.  Cardiovascular: Normal rate and regular rhythm.   Pulmonary/Chest: Effort normal and breath sounds normal.  Lymphadenopathy:    She has no cervical adenopathy.  Neurological: She is alert. She has normal reflexes. She displays no tremor. Coordination normal.  Skin: Skin is warm and dry. No rash noted. No erythema. No pallor.  Facial acne greatly improved  2 superfical/ simple warts at base of R thumbnail-are smaller than prev Debrided in sterile fashion and tx with liq nit cryo times 3 full freeze and  thaw , then dressed Pt tol well  Psychiatric: She has a normal mood and affect. Her speech is normal and behavior is normal. Thought content normal. Her mood appears not anxious. Her affect is not blunt and not labile. She does not exhibit a depressed mood.          Assessment & Plan:

## 2012-09-18 NOTE — Assessment & Plan Note (Signed)
Pt is doing better with effexor - tolerating side effects  cymbalta is not as effective Good coping skills and stressors have improved also

## 2012-09-18 NOTE — Assessment & Plan Note (Signed)
Doing very well with retin A and benzyol peroxide and minocin - will stick with this combination and perhaps stop minocin in 6-12 mo after retin A has caused more skin cell turnover Pt intol of 2 OC- Yaz and ortho tri cyclen- due to nausea- she does not want to try another one yet  Will f/u in about 6 mo

## 2012-09-18 NOTE — Patient Instructions (Addendum)
Keep thumb clean and use antibiotic ointment until healed  Continue Effexor  Continue minocin and retin A and benzyol  If any problems - let me know

## 2012-09-18 NOTE — Assessment & Plan Note (Signed)
2 warts at base of nail debrided and re treated with liquid nit cryotx (times 3) tol well and dressed Aftercare disc  F/u 1-2 mo if no imp

## 2012-09-22 ENCOUNTER — Ambulatory Visit (INDEPENDENT_AMBULATORY_CARE_PROVIDER_SITE_OTHER): Payer: BC Managed Care – PPO | Admitting: *Deleted

## 2012-09-22 DIAGNOSIS — Z111 Encounter for screening for respiratory tuberculosis: Secondary | ICD-10-CM

## 2012-09-22 DIAGNOSIS — Z23 Encounter for immunization: Secondary | ICD-10-CM

## 2012-09-22 NOTE — Addendum Note (Signed)
Addended by: Sueanne Margarita on: 09/22/2012 02:27 PM   Modules accepted: Orders

## 2012-09-25 LAB — TB SKIN TEST: Induration: 0 mm

## 2013-03-09 ENCOUNTER — Ambulatory Visit (INDEPENDENT_AMBULATORY_CARE_PROVIDER_SITE_OTHER): Payer: BC Managed Care – PPO | Admitting: *Deleted

## 2013-03-09 DIAGNOSIS — Z23 Encounter for immunization: Secondary | ICD-10-CM

## 2013-05-16 ENCOUNTER — Other Ambulatory Visit: Payer: Self-pay | Admitting: Family Medicine

## 2013-05-16 NOTE — Telephone Encounter (Signed)
Please schedule f/u for spring (April is ok) and refill until then thanks

## 2013-05-16 NOTE — Telephone Encounter (Signed)
Electronic refill request, please advise  

## 2013-05-17 NOTE — Telephone Encounter (Signed)
Pt is in school and doesn't know her schedule right now she didn't want to schedule a f/u yet, med refill until march and pt will call and schedule a f/u before he meds run out

## 2013-05-17 NOTE — Telephone Encounter (Signed)
Left voicemail requesting pt to call office 

## 2013-08-29 ENCOUNTER — Encounter: Payer: Self-pay | Admitting: Family Medicine

## 2013-08-29 ENCOUNTER — Ambulatory Visit (INDEPENDENT_AMBULATORY_CARE_PROVIDER_SITE_OTHER): Payer: BC Managed Care – PPO | Admitting: Family Medicine

## 2013-08-29 VITALS — BP 104/68 | HR 56 | Temp 98.4°F | Ht 65.5 in | Wt 158.5 lb

## 2013-08-29 DIAGNOSIS — Z111 Encounter for screening for respiratory tuberculosis: Secondary | ICD-10-CM

## 2013-08-29 DIAGNOSIS — R632 Polyphagia: Secondary | ICD-10-CM

## 2013-08-29 DIAGNOSIS — Z0000 Encounter for general adult medical examination without abnormal findings: Secondary | ICD-10-CM

## 2013-08-29 MED ORDER — VENLAFAXINE HCL ER 150 MG PO CP24: 150.0000 mg | ORAL_CAPSULE | Freq: Every day | ORAL | Status: DC

## 2013-08-29 MED ORDER — TRETINOIN 0.025 % EX CREA: TOPICAL_CREAM | CUTANEOUS | Status: DC

## 2013-08-29 NOTE — Assessment & Plan Note (Signed)
This may be due to cravings from effexor- but pt wants to continue med as it has helped her anxiety tremendously She declines counseling  Disc imp of distraction to get through cravings-and need to work on healthy diet and exercise in general  No s/s of body dysmorphic disorder at this time - will follow closely

## 2013-08-29 NOTE — Progress Notes (Signed)
Subjective:    Patient ID: Tamara Mills, female    DOB: May 06, 1991, 23 y.o.   MRN: [id number]  HPI Here for health maintenance exam and to review chronic medical problems    Is doing well overall   Getting some nosebleeds lately - with the dry air-- does not last very long  Mostly one nostril   Needs a PPD today   Feels fine   Tdap  3/14 Flu vaccin 03/09/13  HPV imms  She has never had the HPV imms - and is not interested in them  Not sexually active (has never been sexually active)  Not on birth control -not tolerant of OCs in the past   Gyn  Regular periods for the most part - 3-4 days  Not too heavy or painful   Has not had a pap smear yet -on menses today and also never been sexually active - is apprehensive   Acne is under better controlled   Her anxiety is under good control - effexor  She does have problems with "binge eating" -- this is due to incredible hunger  (she thinks it is irrational)  Mostly craves sweets  No purging   Life is going well  Not a lot of stress currently  In her off season for interviews   Patient Active Problem List   Diagnosis Date Noted  . Routine general medical examination at a health care facility 08/29/2013  . Acne vulgaris 04/28/2012  . Viral wart on right thumb 04/28/2012  . Rectal leakage 01/27/2011  . AMENORRHEA, SECONDARY 12/25/2007  . ACHILLES TENDINITIS 05/09/2007  . ANXIETY STATE NOS 02/28/2007  . DEFORMITY, CAVUS, FOOT, ACQUIRED 02/23/2007   Past Medical History  Diagnosis Date  . Anxiety   . Amenorrhea     Hx of  . Underweight     Hx of  . Acne   . Hand eczema   . Eating disorder     ? eating disorder in past ( pt states just running too much)   No past surgical history on file. History  Substance Use Topics  . Smoking status: Never Smoker   . Smokeless tobacco: Not on file  . Alcohol Use: Yes     Comment: occasional wine   Family History  Problem Relation Age of Onset  . Crohn's disease Brother     Allergies  Allergen Reactions  . Cymbalta [Duloxetine Hcl]     Not effective  . Ortho Tri-Cyclen [Norgestimate-Eth Estradiol]     nausea  . Yaz [Drospirenone-Ethinyl Estradiol]     nausea  . Nickel Rash   Current Outpatient Prescriptions on File Prior to Visit  Medication Sig Dispense Refill  . Benzoyl Peroxide (BENZOYL PEROXIDE) 10 % LIQD Apply topically every morning.      . fexofenadine (ALLEGRA) 180 MG tablet Take 180 mg by mouth daily.      Marland Kitchen ibuprofen (ADVIL,MOTRIN) 200 MG tablet Take 600 mg by mouth every 6 (six) hours as needed.        . tretinoin (RETIN-A) 0.025 % cream Apply topically to affected areas once daily as directed.  45 g  3  . venlafaxine XR (EFFEXOR-XR) 150 MG 24 hr capsule Take 1 capsule (150 mg total) by mouth daily. *follow-up appt. required before refills run out*  30 capsule  4  . [DISCONTINUED] drospirenone-ethinyl estradiol (YAZ) 3-0.02 MG per tablet Take 1 tablet by mouth daily.  28 tablet  11   No current facility-administered medications on file prior  to visit.      Review of Systems Review of Systems  Constitutional: Negative for fever, appetite change, fatigue and unexpected weight change.  Eyes: Negative for pain and visual disturbance.  Respiratory: Negative for cough and shortness of breath.   Cardiovascular: Negative for cp or palpitations    Gastrointestinal: Negative for nausea, diarrhea and constipation.  Genitourinary: Negative for urgency and frequency.  Skin: Negative for pallor or rash   Neurological: Negative for weakness, light-headedness, numbness and headaches.  Hematological: Negative for adenopathy. Does not bruise/bleed easily.  Psychiatric/Behavioral: Negative for dysphoric mood. The patient is not nervous/anxious.         Objective:   Physical Exam  Constitutional: She appears well-developed and well-nourished. No distress.  HENT:  Head: Normocephalic and atraumatic.  Right Ear: External ear normal.  Left Ear:  External ear normal.  Nose: Nose normal.  Mouth/Throat: Oropharynx is clear and moist.  Eyes: Conjunctivae and EOM are normal. Pupils are equal, round, and reactive to light. Right eye exhibits no discharge. Left eye exhibits no discharge. No scleral icterus.  Neck: Normal range of motion. Neck supple. No JVD present. No thyromegaly present.  Cardiovascular: Normal rate, regular rhythm, normal heart sounds and intact distal pulses.  Exam reveals no gallop.   Pulmonary/Chest: Effort normal and breath sounds normal. No respiratory distress. She has no wheezes. She has no rales.  Abdominal: Soft. Bowel sounds are normal. She exhibits no distension and no mass. There is no tenderness.  Musculoskeletal: She exhibits no edema and no tenderness.  Lymphadenopathy:    She has no cervical adenopathy.  Neurological: She is alert. She has normal reflexes. No cranial nerve deficit. She exhibits normal muscle tone. Coordination normal.  Skin: Skin is warm and dry. No rash noted. No erythema. No pallor.  Psychiatric: She has a normal mood and affect.          Assessment & Plan:

## 2013-08-29 NOTE — Progress Notes (Signed)
Pre-visit discussion using our clinic review tool. No additional management support is needed unless otherwise documented below in the visit note.  

## 2013-08-29 NOTE — Assessment & Plan Note (Signed)
Reviewed health habits including diet and exercise and skin cancer prevention Reviewed appropriate screening tests for age  Also reviewed health mt list, fam hx and immunization status , as well as social and family history   Will get a lab panel at age 23- agreed  Pt is apprehensive about gyn exam and pap since she has not been sexually active yet at all and no menstrual problems (also on menses today) Will disc in future- reminded that guidelines are to begin at 7121- she voiced understanding Also she does not want to get the HPV vaccine at this time PPD placed today for work

## 2013-08-29 NOTE — Patient Instructions (Signed)
PPD placement today  Take care of yourself  Work on using distractions for cravings and stay active  If you would like to see a counselor at any time please alert me  No change in medicines

## 2013-08-31 LAB — TB SKIN TEST
Induration: 0 mm
TB SKIN TEST: NEGATIVE

## 2013-09-12 ENCOUNTER — Encounter: Payer: BC Managed Care – PPO | Admitting: Family Medicine

## 2014-04-15 ENCOUNTER — Ambulatory Visit (INDEPENDENT_AMBULATORY_CARE_PROVIDER_SITE_OTHER): Payer: BC Managed Care – PPO | Admitting: *Deleted

## 2014-04-15 DIAGNOSIS — Z23 Encounter for immunization: Secondary | ICD-10-CM

## 2014-08-23 ENCOUNTER — Ambulatory Visit: Payer: Self-pay | Admitting: Family Medicine

## 2014-08-24 ENCOUNTER — Other Ambulatory Visit: Payer: Self-pay | Admitting: Family Medicine

## 2014-09-04 ENCOUNTER — Ambulatory Visit (INDEPENDENT_AMBULATORY_CARE_PROVIDER_SITE_OTHER): Payer: 59 | Admitting: Family Medicine

## 2014-09-04 ENCOUNTER — Encounter: Payer: Self-pay | Admitting: Family Medicine

## 2014-09-04 VITALS — BP 112/78 | HR 67 | Temp 98.7°F | Ht 66.0 in | Wt 163.4 lb

## 2014-09-04 DIAGNOSIS — Z0000 Encounter for general adult medical examination without abnormal findings: Secondary | ICD-10-CM

## 2014-09-04 MED ORDER — VENLAFAXINE HCL ER 150 MG PO CP24: ORAL_CAPSULE | ORAL | Status: DC

## 2014-09-04 MED ORDER — TRETINOIN 0.025 % EX CREA: TOPICAL_CREAM | CUTANEOUS | Status: DC

## 2014-09-04 NOTE — Progress Notes (Signed)
Subjective:    Patient ID: Tamara Mills, female    DOB: October 28, 1990, 24 y.o.   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 - [employer]  Enjoys it  Has a house and a dog  No relationship at this time- that is ok  Spends time with her niece   Has not had a pap smear - never sexually active  Does not want HPV vaccine yet   Periods - are not bad - short (3 days)- every 28 days  Does not tolerate OC (tried several)  Doing well with anxiety -on effexor 150 -only side eff is dry mouth  Wonders if she could come down on it   Acne- flares around her periods  Was not using her meds regularly - getting back on track   Started doing the "insanity"work out -before work in the mornings and also an elliptical    Will do baseline wellness labs today   Patient Active Problem List   Diagnosis Date Noted  . Routine general medical examination at a health care facility 08/29/2013  . Binge eating 08/29/2013  . Acne vulgaris 04/28/2012  . Viral wart on right thumb 04/28/2012  . Rectal leakage 01/27/2011  . ACHILLES TENDINITIS 05/09/2007  . ANXIETY STATE NOS 02/28/2007  . DEFORMITY, CAVUS, FOOT, ACQUIRED 02/23/2007   Past Medical History  Diagnosis Date  . Anxiety   . Amenorrhea     Hx of  . Underweight     Hx of  . Acne   . Hand eczema   . Eating disorder     ? eating disorder in past ( pt states just running too much)   No past surgical history on file. History  Substance Use Topics  . Smoking status: Never Smoker   . Smokeless tobacco: Not on file  . Alcohol Use: Yes     Comment: occasional wine   Family History  Problem Relation Age of Onset  . Crohn's disease Brother    Allergies  Allergen Reactions  . Cymbalta [Duloxetine Hcl]     Not  effective  . Ortho Tri-Cyclen [Norgestimate-Eth Estradiol]     nausea  . Yaz [Drospirenone-Ethinyl Estradiol]     nausea  . Nickel Rash   Current Outpatient Prescriptions on File Prior to Visit  Medication Sig Dispense Refill  . Benzoyl Peroxide (BENZOYL PEROXIDE) 10 % LIQD Apply topically every morning.    . fexofenadine (ALLEGRA) 180 MG tablet Take 180 mg by mouth daily.    Marland Kitchen. ibuprofen (ADVIL,MOTRIN) 200 MG tablet Take 600 mg by mouth every 6 (six) hours as needed.      . tretinoin (RETIN-A) 0.025 % cream Apply topically to affected areas once daily as directed. 45 g 3  . venlafaxine XR (EFFEXOR-XR) 150 MG 24 hr capsule TAKE 1 CAPSULE (150 MG TOTAL) BY MOUTH DAILY. 30 capsule 0  . [DISCONTINUED] drospirenone-ethinyl estradiol (YAZ) 3-0.02 MG per tablet Take 1 tablet by mouth daily. 28 tablet 11   No current facility-administered medications on file prior to visit.    Review of Systems Review of Systems  Constitutional: Negative for fever, appetite change, fatigue and unexpected weight change.  Eyes: Negative for pain  and visual disturbance.  Respiratory: Negative for cough and shortness of breath.   Cardiovascular: Negative for cp or palpitations    Gastrointestinal: Negative for nausea, diarrhea and constipation.  Genitourinary: Negative for urgency and frequency.  Skin: Negative for pallor or rash  pos for facial acne Neurological: Negative for weakness, light-headedness, numbness and headaches.  Hematological: Negative for adenopathy. Does not bruise/bleed easily.  Psychiatric/Behavioral: Negative for dysphoric mood. The patient is not nervous/anxious.         Objective:   Physical Exam  Constitutional: She appears well-developed and well-nourished. No distress.  HENT:  Head: Normocephalic and atraumatic.  Right Ear: External ear normal.  Left Ear: External ear normal.  Nose: Nose normal.  Mouth/Throat: Oropharynx is clear and moist.  Eyes: Conjunctivae and EOM are  normal. Pupils are equal, round, and reactive to light. Right eye exhibits no discharge. Left eye exhibits no discharge. No scleral icterus.  Neck: Normal range of motion. Neck supple. No JVD present. No thyromegaly present.  Cardiovascular: Normal rate, regular rhythm, normal heart sounds and intact distal pulses.  Exam reveals no gallop.   Pulmonary/Chest: Effort normal and breath sounds normal. No respiratory distress. She has no wheezes. She has no rales.  Abdominal: Soft. Bowel sounds are normal. She exhibits no distension and no mass. There is no tenderness.  Musculoskeletal: She exhibits no edema or tenderness.  Lymphadenopathy:    She has no cervical adenopathy.  Neurological: She is alert. She has normal reflexes. No cranial nerve deficit. She exhibits normal muscle tone. Coordination normal.  Skin: Skin is warm and dry. No rash noted. No erythema. No pallor.  Comedones-facial acne that is improved No pustules   R thumb wart-improved/recently treated and peeling   Psychiatric: She has a normal mood and affect.          Assessment & Plan:   Problem List Items Addressed This Visit      Other   Routine general medical examination at a health care facility - Primary    Reviewed health habits including diet and exercise and skin cancer prevention Reviewed appropriate screening tests for age  Also reviewed health mt list, fam hx and immunization status , as well as social and family history    Wellness labs today Pt defers gyn exam since she has never been sexually active  Does not want HPV vaccine yet       Relevant Orders   CBC with Differential/Platelet (Completed)   Comprehensive metabolic panel (Completed)   TSH (Completed)   Lipid panel (Completed)

## 2014-09-04 NOTE — Progress Notes (Signed)
Pre visit review using our clinic review tool, if applicable. No additional management support is needed unless otherwise documented below in the visit note. 

## 2014-09-04 NOTE — Patient Instructions (Signed)
Take care of yourself  Baseline wellness labs today   If you want to get HPV vaccines at any time let me know

## 2014-09-05 LAB — COMPREHENSIVE METABOLIC PANEL
ALK PHOS: 126 U/L — AB (ref 39–117)
ALT: 16 U/L (ref 0–35)
AST: 21 U/L (ref 0–37)
Albumin: 4.8 g/dL (ref 3.5–5.2)
BILIRUBIN TOTAL: 0.5 mg/dL (ref 0.2–1.2)
BUN: 21 mg/dL (ref 6–23)
CO2: 22 meq/L (ref 19–32)
Calcium: 9.7 mg/dL (ref 8.4–10.5)
Chloride: 105 mEq/L (ref 96–112)
Creatinine, Ser: 0.67 mg/dL (ref 0.40–1.20)
GFR: 113.91 mL/min (ref >60.00)
GLUCOSE: 77 mg/dL (ref 70–99)
Potassium: 4.2 mEq/L (ref 3.5–5.1)
SODIUM: 136 meq/L (ref 135–145)
TOTAL PROTEIN: 7.3 g/dL (ref 6.0–8.3)

## 2014-09-05 LAB — CBC WITH DIFFERENTIAL/PLATELET
Basophils Absolute: 0 10*3/uL (ref 0.0–0.1)
Basophils Relative: 0.4 % (ref 0.0–3.0)
EOS PCT: 2.9 % (ref 0.0–5.0)
Eosinophils Absolute: 0.2 10*3/uL (ref 0.0–0.7)
HCT: 40 % (ref 36.0–46.0)
Hemoglobin: 13.8 g/dL (ref 12.0–15.0)
LYMPHS PCT: 34.9 % (ref 12.0–46.0)
Lymphs Abs: 2.8 10*3/uL (ref 0.7–4.0)
MCHC: 34.5 g/dL (ref 30.0–36.0)
MCV: 86.1 fl (ref 78.0–100.0)
MONOS PCT: 9.3 % (ref 3.0–12.0)
Monocytes Absolute: 0.7 10*3/uL (ref 0.1–1.0)
NEUTROS PCT: 52.5 % (ref 43.0–77.0)
Neutro Abs: 4.2 10*3/uL (ref 1.4–7.7)
PLATELETS: 276 10*3/uL (ref 150.0–400.0)
RBC: 4.64 Mil/uL (ref 3.87–5.11)
RDW: 13 % (ref 11.5–15.5)
WBC: 7.9 10*3/uL (ref 4.0–10.5)

## 2014-09-05 LAB — LIPID PANEL
CHOL/HDL RATIO: 4
Cholesterol: 192 mg/dL (ref 0–200)
HDL: 50.4 mg/dL (ref >39.00)
LDL CALC: 129 mg/dL — AB (ref 0–99)
NonHDL: 141.6
Triglycerides: 63 mg/dL (ref 0.0–149.0)
VLDL: 12.6 mg/dL (ref 0.0–40.0)

## 2014-09-05 LAB — TSH: TSH: 2.22 u[IU]/mL (ref 0.35–4.50)

## 2014-09-05 NOTE — Assessment & Plan Note (Signed)
Reviewed health habits including diet and exercise and skin cancer prevention Reviewed appropriate screening tests for age  Also reviewed health mt list, fam hx and immunization status , as well as social and family history    Wellness labs today Pt defers gyn exam since she has never been sexually active  Does not want HPV vaccine yet

## 2015-04-10 ENCOUNTER — Ambulatory Visit (INDEPENDENT_AMBULATORY_CARE_PROVIDER_SITE_OTHER): Payer: 59

## 2015-04-10 DIAGNOSIS — Z23 Encounter for immunization: Secondary | ICD-10-CM | POA: Diagnosis not present

## 2015-05-20 ENCOUNTER — Encounter: Payer: Self-pay | Admitting: Family Medicine

## 2015-05-20 ENCOUNTER — Ambulatory Visit (INDEPENDENT_AMBULATORY_CARE_PROVIDER_SITE_OTHER): Payer: 59 | Admitting: Family Medicine

## 2015-05-20 VITALS — BP 102/70 | HR 46 | Temp 97.5°F | Ht 66.0 in | Wt 119.8 lb

## 2015-05-20 DIAGNOSIS — IMO0001 Reserved for inherently not codable concepts without codable children: Secondary | ICD-10-CM

## 2015-05-20 DIAGNOSIS — T731XXA Deprivation of water, initial encounter: Secondary | ICD-10-CM

## 2015-05-20 DIAGNOSIS — T148 Other injury of unspecified body region: Secondary | ICD-10-CM

## 2015-05-20 DIAGNOSIS — R5382 Chronic fatigue, unspecified: Secondary | ICD-10-CM | POA: Diagnosis not present

## 2015-05-20 DIAGNOSIS — R5383 Other fatigue: Secondary | ICD-10-CM | POA: Insufficient documentation

## 2015-05-20 DIAGNOSIS — R638 Other symptoms and signs concerning food and fluid intake: Secondary | ICD-10-CM | POA: Insufficient documentation

## 2015-05-20 DIAGNOSIS — T148XXA Other injury of unspecified body region, initial encounter: Secondary | ICD-10-CM

## 2015-05-20 LAB — CBC WITH DIFFERENTIAL/PLATELET
BASOS ABS: 0 10*3/uL (ref 0.0–0.1)
Basophils Relative: 0.5 % (ref 0.0–3.0)
Eosinophils Absolute: 0 10*3/uL (ref 0.0–0.7)
Eosinophils Relative: 0.1 % (ref 0.0–5.0)
HEMATOCRIT: 41.3 % (ref 36.0–46.0)
Hemoglobin: 13.8 g/dL (ref 12.0–15.0)
LYMPHS PCT: 27.3 % (ref 12.0–46.0)
Lymphs Abs: 1.8 10*3/uL (ref 0.7–4.0)
MCHC: 33.4 g/dL (ref 30.0–36.0)
MCV: 96.2 fl (ref 78.0–100.0)
MONOS PCT: 6.4 % (ref 3.0–12.0)
Monocytes Absolute: 0.4 10*3/uL (ref 0.1–1.0)
NEUTROS ABS: 4.4 10*3/uL (ref 1.4–7.7)
Neutrophils Relative %: 65.7 % (ref 43.0–77.0)
Platelets: 171 10*3/uL (ref 150.0–400.0)
RBC: 4.3 Mil/uL (ref 3.87–5.11)
RDW: 14.4 % (ref 11.5–15.5)
WBC: 6.6 10*3/uL (ref 4.0–10.5)

## 2015-05-20 LAB — TSH: TSH: 0.71 u[IU]/mL (ref 0.35–4.50)

## 2015-05-20 LAB — CORTISOL: Cortisol, Plasma: 14.3 ug/dL

## 2015-05-20 LAB — BASIC METABOLIC PANEL
BUN: 12 mg/dL (ref 6–23)
CALCIUM: 9.6 mg/dL (ref 8.4–10.5)
CHLORIDE: 98 meq/L (ref 96–112)
CO2: 25 mEq/L (ref 19–32)
Creatinine, Ser: 0.8 mg/dL (ref 0.40–1.20)
GFR: 92.31 mL/min (ref >60.00)
Glucose, Bld: 80 mg/dL (ref 70–99)
Potassium: 4.2 mEq/L (ref 3.5–5.1)
Sodium: 131 mEq/L — ABNORMAL LOW (ref 135–145)

## 2015-05-20 NOTE — Patient Instructions (Signed)
Maintain your weight- do not try to loose more Try benadryl for sleep as needed-if not helpful please let me know  Eat a balanced diet with protein Lab today for fatigue and bruising and thirst   Take care of yourself

## 2015-05-20 NOTE — Progress Notes (Signed)
Subjective:    Patient ID: Tamara Mills, female    DOB: [birth year], 24 y.o.   MRN: [id number]  HPI Here for f/u of chronic medical problems   Has been bruising easily  Lab Results  Component Value Date   WBC 7.9 09/05/2014   HGB 13.8 09/05/2014   HCT 40.0 09/05/2014   MCV 86.1 09/05/2014   PLT 276.0 09/05/2014   not on ibuprofen  Today- 2 on legs -upper - different ages    Has been working on wt loss  Wt is down 44 lb  Trying now to build muscle and wt has leveled off   Appetite is much more normal off of effexor  Things are going ok  occ anxiety - she has insomnia occas  Things are going well  Has a boyfriend - pretty serious / he is a Education officer, environmentalfinancial advisor   Coping tech- started meditation Also reading and exercise    Job is going well   bmi is 19.3  She is eating enough / also adding some healthier fats  Also exercising - likes the insanity work out- [facility] [employer] [facility] [employer] - about 3 days per week  Taking mvi   Issues with being underweight in the past  No body image issues   BP Readings from Last 3 Encounters:  05/20/15 102/70  09/04/14 112/78  08/29/13 104/68    On Yaz- due to acne getting worse  Just started it  Hormone levels are normal  Seeing her gyn  No heavy bleeding   Some fatigue  Eating a pretty high protein diet  Hands and feet are cold Lab Results  Component Value Date   TSH 2.22 09/05/2014    Is thirsty a lot - drinking lots of water ? About cortisol and sugar levels   Patient Active Problem List   Diagnosis Date Noted  . Routine general medical examination at a health care facility 08/29/2013  . Acne vulgaris 04/28/2012  . Viral wart on right thumb 04/28/2012  . Rectal leakage 01/27/2011  . ANXIETY STATE NOS 02/28/2007  . DEFORMITY, CAVUS, FOOT, ACQUIRED 02/23/2007   Past Medical History  Diagnosis Date  . Anxiety   . Amenorrhea     Hx of  . Underweight     Hx of  . Acne   . Hand eczema   . Eating disorder     ? eating  disorder in past ( pt states just running too much)   No past surgical history on file. Social History  Substance Use Topics  . Smoking status: Never Smoker   . Smokeless tobacco: None  . Alcohol Use: 0.0 oz/week    0 Standard drinks or equivalent per week     Comment: occasional wine   Family History  Problem Relation Age of Onset  . Crohn's disease Brother    Allergies  Allergen Reactions  . Ortho Tri-Cyclen [Norgestimate-Eth Estradiol]     nausea  . Nickel Rash   Current Outpatient Prescriptions on File Prior to Visit  Medication Sig Dispense Refill  . Benzoyl Peroxide (BENZOYL PEROXIDE) 10 % LIQD Apply topically every morning.    . fexofenadine (ALLEGRA) 180 MG tablet Take 180 mg by mouth daily.    Marland Kitchen. ibuprofen (ADVIL,MOTRIN) 200 MG tablet Take 600 mg by mouth every 6 (six) hours as needed.      . tretinoin (RETIN-A) 0.025 % cream Apply topically to affected areas once daily as directed. 45 g 3   No  current facility-administered medications on file prior to visit.     Review of Systems Review of Systems  Constitutional: Negative for fever, appetite change, and unexpected weight change.  Eyes: Negative for pain and visual disturbance.  Respiratory: Negative for cough and shortness of breath.   Cardiovascular: Negative for cp or palpitations    Gastrointestinal: Negative for nausea, diarrhea and constipation.  Genitourinary: Negative for urgency and frequency. no excessive urination  Skin: Negative for pallor or rash   Neurological: Negative for weakness, light-headedness, numbness and headaches.  Hematological: Negative for adenopathy. Does not bruise/bleed easily.  Psychiatric/Behavioral: Negative for dysphoric mood. The patient is not nervous/anxious.  (mood is good off snri)       Objective:   Physical Exam  Constitutional: She appears well-developed and well-nourished. No distress.  Slim and well appearing   HENT:  Head: Normocephalic and atraumatic.  Right  Ear: External ear normal.  Left Ear: External ear normal.  Nose: Nose normal.  Mouth/Throat: Oropharynx is clear and moist.  Eyes: Conjunctivae and EOM are normal. Pupils are equal, round, and reactive to light. Right eye exhibits no discharge. Left eye exhibits no discharge. No scleral icterus.  Neck: Normal range of motion. Neck supple. No JVD present. Carotid bruit is not present. No thyromegaly present.  Cardiovascular: Normal rate, regular rhythm, normal heart sounds and intact distal pulses.  Exam reveals no gallop.   Pulmonary/Chest: Effort normal and breath sounds normal. No respiratory distress. She has no wheezes. She has no rales.  Abdominal: Soft. Bowel sounds are normal. She exhibits no distension and no mass. There is no tenderness.  Musculoskeletal: She exhibits no edema or tenderness.  Lymphadenopathy:    She has no cervical adenopathy.  Neurological: She is alert. She has normal reflexes. No cranial nerve deficit. She exhibits normal muscle tone. Coordination normal.  Skin: Skin is warm and dry. No rash noted. No erythema. No pallor.  Worsened comedonal facial acnes on cheeks/chin/forehead  One old bruise on each thigh 2 cm fading/yellow bruise on R inner arm No petichae   Psychiatric: She has a normal mood and affect.          Assessment & Plan:   Problem List Items Addressed This Visit      Other   Bruising    In setting of large intentional wt loss  No easy bleeding  On arms and legs No petichae  Check cbc with platelet       Relevant Orders   CBC with Differential/Platelet (Completed)   Fatigue - Primary    Suspect due to schedule  Also cold in tol-may be due to wt loss -checking thyroid also  Lab today  Disc self care/sleep habits       Relevant Orders   TSH (Completed)   CBC with Differential/Platelet (Completed)   Basic metabolic panel (Completed)   Cortisol (Completed)   Thirst    Check glucose and cortisol today  Reassuring exam       Relevant Orders   Cortisol (Completed)

## 2015-05-20 NOTE — Progress Notes (Signed)
Pre visit review using our clinic review tool, if applicable. No additional management support is needed unless otherwise documented below in the visit note. 

## 2015-05-21 NOTE — Assessment & Plan Note (Signed)
Check glucose and cortisol today  Reassuring exam

## 2015-05-21 NOTE — Assessment & Plan Note (Signed)
Suspect due to schedule  Also cold in tol-may be due to wt loss -checking thyroid also  Lab today  Disc self care/sleep habits

## 2015-05-21 NOTE — Assessment & Plan Note (Signed)
In setting of large intentional wt loss  No easy bleeding  On arms and legs No petichae  Check cbc with platelet

## 2015-06-24 ENCOUNTER — Encounter: Payer: Self-pay | Admitting: Family Medicine

## 2015-06-25 ENCOUNTER — Telehealth: Payer: Self-pay | Admitting: Family Medicine

## 2015-06-25 DIAGNOSIS — L700 Acne vulgaris: Secondary | ICD-10-CM

## 2015-06-25 MED ORDER — DOXYCYCLINE HYCLATE 100 MG PO TABS: 100.0000 mg | ORAL_TABLET | Freq: Every day | ORAL | Status: DC

## 2015-06-25 NOTE — Telephone Encounter (Signed)
Doxycycline for acne tx

## 2015-08-27 ENCOUNTER — Encounter: Payer: Self-pay | Admitting: Family Medicine

## 2015-10-03 ENCOUNTER — Other Ambulatory Visit: Payer: Self-pay | Admitting: Family Medicine

## 2015-10-06 NOTE — Telephone Encounter (Signed)
Last refilled on 06/25/15 #30 with 3 additional refills, please advise

## 2015-10-06 NOTE — Telephone Encounter (Signed)
Please refill times 3 

## 2015-12-29 ENCOUNTER — Encounter: Payer: Self-pay | Admitting: Gastroenterology

## 2016-01-17 ENCOUNTER — Other Ambulatory Visit: Payer: Self-pay | Admitting: Family Medicine

## 2016-01-19 NOTE — Telephone Encounter (Signed)
done

## 2016-01-19 NOTE — Telephone Encounter (Signed)
Please refill for 6 mo 

## 2016-01-19 NOTE — Telephone Encounter (Signed)
Last filled on 10/06/15 #30 tabs with 3 additional refills, please advise

## 2016-02-26 ENCOUNTER — Encounter: Payer: Self-pay | Admitting: Family Medicine

## 2016-02-26 MED ORDER — DROSPIRENONE-ETHINYL ESTRADIOL 3-0.03 MG PO TABS: 1.0000 | ORAL_TABLET | Freq: Every day | ORAL | 11 refills | Status: DC

## 2016-02-26 NOTE — Telephone Encounter (Signed)
Please let pt know I sent yasmin to her pharmacy

## 2016-03-05 ENCOUNTER — Ambulatory Visit (INDEPENDENT_AMBULATORY_CARE_PROVIDER_SITE_OTHER): Payer: 59 | Admitting: Gastroenterology

## 2016-03-05 ENCOUNTER — Encounter: Payer: Self-pay | Admitting: Gastroenterology

## 2016-03-05 VITALS — BP 90/60 | HR 72 | Ht 65.0 in | Wt 118.2 lb

## 2016-03-05 DIAGNOSIS — K642 Third degree hemorrhoids: Secondary | ICD-10-CM

## 2016-03-05 DIAGNOSIS — K648 Other hemorrhoids: Secondary | ICD-10-CM

## 2016-03-05 NOTE — Patient Instructions (Signed)
It has been recommended to you by your physician that you have a(n) Flexable Sigmoidoscopy completed. Per your request, we did not schedule the procedure(s) today. Please contact our office at [phone] should you decide to have the procedure completed.  Kegel Exercises The goal of Kegel exercises is to isolate and exercise your pelvic floor muscles. These muscles act as a hammock that supports the rectum, vagina, small intestine, and uterus. As the muscles weaken, the hammock sags and these organs are displaced from their normal positions. Kegel exercises can strengthen your pelvic floor muscles and help you to improve bladder and bowel control, improve sexual response, and help reduce many problems and some discomfort during pregnancy. Kegel exercises can be done anywhere and at any time. HOW TO PERFORM KEGEL EXERCISES 1. Locate your pelvic floor muscles. To do this, squeeze (contract) the muscles that you use when you try to stop the flow of urine. You will feel a tightness in the vaginal area (women) and a tight lift in the rectal area (men and women). 2. When you begin, contract your pelvic muscles tight for 2-5 seconds, then relax them for 2-5 seconds. This is one set. Do 4-5 sets with a short pause in between. 3. Contract your pelvic muscles for 8-10 seconds, then relax them for 8-10 seconds. Do 4-5 sets. If you cannot contract your pelvic muscles for 8-10 seconds, try 5-7 seconds and work your way up to 8-10 seconds. Your goal is 4-5 sets of 10 contractions each day. Keep your stomach, buttocks, and legs relaxed during the exercises. Perform sets of both short and long contractions. Vary your positions. Perform these contractions 3-4 times per day. Perform sets while you are:   Lying in bed in the morning.  Standing at lunch.  Sitting in the late afternoon.  Lying in bed at night. You should do 40-50 contractions per day. Do not perform more Kegel exercises per day than recommended.  Overexercising can cause muscle fatigue. Continue these exercises for for at least 15-20 weeks or as directed by your caregiver.   This information is not intended to replace advice given to you by your health care provider. Make sure you discuss any questions you have with your health care provider.   Document Released: 06/14/2012 Document Revised: 07/19/2014 Document Reviewed: 06/14/2012 Elsevier Interactive Patient Education 2016 [employer]   Call back to schedule hemorrhoidal banding after you have your Flex Sigmoidoscopy preformed

## 2016-03-07 NOTE — Progress Notes (Signed)
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 She noticed this after she had anal sex, denies forceful entry or anal trauma. Denies any blood per rectum. She does notice occasional mucus discharge especially after she runs long-distance. No family history of colorectal cancer. She is otherwise healthy and has no other complaints. Denies any nausea, vomiting, abdominal pain, melena or bright red blood per rectum    Outpatient Encounter Prescriptions as of 03/05/2016  Medication Sig  . Benzoyl Peroxide (BENZOYL PEROXIDE) 10 % LIQD Apply topically every morning.  Marland Kitchen. doxycycline (VIBRA-TABS) 100 MG tablet TAKE 1 TABLET (100 MG TOTAL) BY MOUTH DAILY. FOR ACNE  . drospirenone-ethinyl estradiol (YASMIN,ZARAH,SYEDA) 3-0.03 MG tablet Take 1 tablet by mouth daily.  . fexofenadine (ALLEGRA) 180 MG tablet Take 180 mg by mouth daily.  Marland Kitchen. ibuprofen (ADVIL,MOTRIN) 200 MG tablet Take 600 mg by mouth every 6 (six) hours as needed.    . tretinoin (RETIN-A) 0.025 % cream Apply topically to affected areas once daily as directed.   No facility-administered encounter medications on file as of 03/05/2016.     Allergies as of 03/05/2016 - Review Complete 03/05/2016  Allergen Reaction Noted  . Ortho tri-cyclen [norgestimate-eth estradiol]  09/18/2012  . Nickel Rash 01/27/2011    Past Medical History:  Diagnosis Date  . Acne   . Amenorrhea    Hx of  . Anxiety   . Eating disorder    ? eating disorder in past ( pt states just running too much)  . Hand eczema   . Underweight    Hx of     History reviewed. No pertinent surgical history.  Family History  Problem Relation Age of Onset  . Crohn's disease Brother   . Ulcerative colitis Brother     Social History   Social History  . Marital status: Single    Spouse name: N/A  . Number of children: N/A  . Years of education: N/A   Occupational History  . Not on file.   Social History Main Topics  . Smoking status: Never Smoker  . Smokeless tobacco: Never Used  . Alcohol use 0.0 oz/week     Comment: occasional wine  . Drug use: No  . Sexual activity: Not on file   Other Topics Concern  . Not on file   Social History Narrative  . No narrative on file      Review of systems: Review of Systems  Constitutional: Negative for fever and chills.  HENT: Negative.   Eyes: Negative for blurred vision.  Respiratory: Negative for cough, shortness of breath and wheezing.   Cardiovascular: Negative for chest pain and palpitations.  Gastrointestinal: as per HPI Genitourinary: Negative for dysuria, urgency, frequency and hematuria.  Musculoskeletal: Negative for myalgias, back pain and joint pain.  Skin: Negative for itching and rash.  Neurological: Negative for dizziness, tremors, focal weakness, seizures and loss of consciousness.  Endo/Heme/Allergies: Positive for seasonal allergies.  Psychiatric/Behavioral: Negative for depression, suicidal ideas and hallucinations.  All other systems reviewed and are negative.  Physical Exam: Vitals:   03/05/16 0841  BP: 90/60  Pulse: 72   Body mass index is 19.68 kg/m. Gen:      No acute distress HEENT:  EOMI, sclera anicteric Neck:     No masses; no thyromegaly Lungs:    Clear to auscultation bilaterally; normal respiratory effort CV:         Regular rate and rhythm; no murmurs Abd:      + bowel sounds; soft, non-tender; no palpable masses, no distension Ext:    No edema; adequate peripheral perfusion Skin:      Warm and dry; no rash Neuro: alert and oriented  x 3 Psych: normal mood and affect Rectal exam: Normal anal sphincter tone, no anal fissure or external hemorrhoids Anoscopy: Small to medium, grade 2 internal hemorrhoids, no active bleeding, normal dentate line, no visible nodules  Data Reviewed:  Reviewed chart in epic   Assessment and Plan/Recommendations:  25 year old female here with complaints of tissue prolapse from rectum that she has to reduce with her finger after a bowel movement occasionally, based on exam likely etiology prolapsed internal hemorrhoids.  Reassured patient, advised her to avoid any anal trauma Avoid sitting on toilet for prolonged period of time or excessive straining Also advised patient to do Kegel exercises to improve the anal sphincter tone Can consider hemorrhoidal band ligation if continues to be symptomatic but prior to band ligation will need flexible sigmoidoscopy to exclude any other etiology  Return as needed Greater than 50% of the time used for counseling as well as treatment plan and follow-up. She had multiple questions which were answered to her satisfaction  K. Scherry Ran , MD [phone] Mon-Fri 8a-5p [phone] after 5p, weekends, holidays  CC: Tower, Audrie Gallus, MD

## 2016-03-16 ENCOUNTER — Other Ambulatory Visit: Payer: Self-pay | Admitting: *Deleted

## 2016-03-16 MED ORDER — TRETINOIN 0.025 % EX CREA: TOPICAL_CREAM | CUTANEOUS | 3 refills | Status: DC

## 2016-03-16 NOTE — Telephone Encounter (Signed)
Last filled on 09/04/14 #45g with 3 additional refills, please advise

## 2016-03-16 NOTE — Telephone Encounter (Signed)
done

## 2016-03-16 NOTE — Telephone Encounter (Signed)
Please refill for a year  

## 2016-04-30 ENCOUNTER — Ambulatory Visit (INDEPENDENT_AMBULATORY_CARE_PROVIDER_SITE_OTHER): Payer: 59

## 2016-04-30 DIAGNOSIS — Z23 Encounter for immunization: Secondary | ICD-10-CM

## 2016-06-23 ENCOUNTER — Other Ambulatory Visit: Payer: Self-pay | Admitting: Family Medicine

## 2016-06-23 NOTE — Telephone Encounter (Signed)
Done

## 2016-06-23 NOTE — Telephone Encounter (Signed)
Please refill for 6 mo 

## 2016-06-23 NOTE — Telephone Encounter (Signed)
Last filled on 01/19/16 #30 tabs with 5 additional refills, please advise

## 2016-06-25 ENCOUNTER — Encounter: Payer: Self-pay | Admitting: Family Medicine

## 2016-06-25 ENCOUNTER — Ambulatory Visit (INDEPENDENT_AMBULATORY_CARE_PROVIDER_SITE_OTHER): Payer: 59 | Admitting: Family Medicine

## 2016-06-25 VITALS — BP 104/74 | HR 60 | Temp 97.5°F | Ht 65.0 in | Wt 145.5 lb

## 2016-06-25 DIAGNOSIS — F4320 Adjustment disorder, unspecified: Secondary | ICD-10-CM

## 2016-06-25 DIAGNOSIS — F4321 Adjustment disorder with depressed mood: Secondary | ICD-10-CM | POA: Insufficient documentation

## 2016-06-25 DIAGNOSIS — L700 Acne vulgaris: Secondary | ICD-10-CM

## 2016-06-25 DIAGNOSIS — R002 Palpitations: Secondary | ICD-10-CM | POA: Diagnosis not present

## 2016-06-25 DIAGNOSIS — Z8241 Family history of sudden cardiac death: Secondary | ICD-10-CM | POA: Insufficient documentation

## 2016-06-25 MED ORDER — TRETINOIN 0.025 % EX CREA: TOPICAL_CREAM | CUTANEOUS | 5 refills | Status: DC

## 2016-06-25 MED ORDER — DOXYCYCLINE HYCLATE 100 MG PO TABS: ORAL_TABLET | ORAL | 11 refills | Status: DC

## 2016-06-25 MED ORDER — BUPROPION HCL ER (XL) 150 MG PO TB24: 150.0000 mg | ORAL_TABLET | Freq: Every day | ORAL | 11 refills | Status: DC

## 2016-06-25 NOTE — Assessment & Plan Note (Signed)
Pt has had palpitations occ for years - did ask her to cut out caffeine  Re assuring ekg Will cc to Dr Graciela HusbandsKlein  Reassuring exam

## 2016-06-25 NOTE — Assessment & Plan Note (Signed)
Doing very well -still on doxycycline with retinoid and benzyol peroxide May consider d/c doxy after her wedding in April Has some stress acne  Will follow

## 2016-06-25 NOTE — Assessment & Plan Note (Signed)
Sudden loss of brother and co worker Reviewed stressors/ coping techniques/symptoms/ support sources/ tx options and side effects in detail today She has emotional eating issues-otherwise good coping skills Plans to return to exercise Trial of wellbutrin to help the vegetative symptoms Discussed expectations of this medication including time to effectiveness and mechanism of action, also poss of side effects (early and late)- including mental fuzziness, weight or appetite change, nausea and poss of worse dep or anxiety (even suicidal thoughts)  Pt voiced understanding and will stop med and update if this occurs    >25 minutes spent in face to face time with patient, >50% spent in counselling or coordination of care  She will stay in contact Stressed imp of self care and socialization

## 2016-06-25 NOTE — Progress Notes (Signed)
Pre visit review using our clinic review tool, if applicable. No additional management support is needed unless otherwise documented below in the visit note. 

## 2016-06-25 NOTE — Progress Notes (Signed)
Subjective:    Patient ID: Tamara Mills, female    DOB: [birth year], 25 y.o.   MRN: [id number]  HPI Here for f/u for chronic problems   Acne- has been on doxycycline for a long time  Would like to change to a topical after the wedding  [facility] [place]    Has had palpitations lately (not unusual for her )  Pounding and she can feel her heart beat- usually at night / a few minutes at a time  No sob or dizziness or other symptoms  Of note-her brother died recently (sudden cardiac death) at an early age  He had a heart arrhythmia thought to be caused by otc meds in the past  He saw Dr Graciela Husbands   Not avoiding caffeine  2-3 cups of coffee per day    Grief has been tough Also a co worker died recently   Her EKG- rate of 62, NSR with some R axis deviation (noted likely nl for age)    Wt Readings from Last 3 Encounters:  2016-07-16 145 lb 8 oz (66 kg)  03/05/16 118 lb 4 oz (53.6 kg)  05/20/15 119 lb 12 oz (54.3 kg)   bmi is 24  Has more depression lately  Is interested in wellbutrin  She has vegetative symptoms of depression with lack of motivation  Getting married in April -happy with that   Not quite hopeless  No anhedonia  Feels like she could get that way  When her mood is off she eats more -wants to control that   Not doing much re: self care due to lack of motivation  Eating and being with her family are her coping mechanisms/ also likes to read and likes to run  Patient Active Problem List   Diagnosis Date Noted  . Family history of sudden cardiac death 07-16-2016  . Grief reaction 16-Jul-2016  . Palpitations July 16, 2016  . Fatigue 05/20/2015  . Bruising 05/20/2015  . Thirst 05/20/2015  . Routine general medical examination at a health care facility 08/29/2013  . Acne vulgaris 04/28/2012  . Viral wart on right thumb 04/28/2012  . Rectal leakage 01/27/2011  . ANXIETY STATE NOS 02/28/2007  . DEFORMITY, CAVUS, FOOT, ACQUIRED 02/23/2007   Past Medical History:  Diagnosis  Date  . Acne   . Amenorrhea    Hx of  . Anxiety   . Eating disorder    ? eating disorder in past ( pt states just running too much)  . Hand eczema   . Underweight    Hx of   No past surgical history on file. Social History  Substance Use Topics  . Smoking status: Never Smoker  . Smokeless tobacco: Never Used  . Alcohol use 0.0 oz/week     Comment: occasional wine   Family History  Problem Relation Age of Onset  . Crohn's disease Brother   . Ulcerative colitis Brother    Allergies  Allergen Reactions  . Ortho Tri-Cyclen [Norgestimate-Eth Estradiol]     nausea  . Nickel Rash   Current Outpatient Prescriptions on File Prior to Visit  Medication Sig Dispense Refill  . Benzoyl Peroxide (BENZOYL PEROXIDE) 10 % LIQD Apply topically every morning.    . drospirenone-ethinyl estradiol (YASMIN,ZARAH,SYEDA) 3-0.03 MG tablet Take 1 tablet by mouth daily. 1 Package 11  . fexofenadine (ALLEGRA) 180 MG tablet Take 180 mg by mouth daily as needed.     Marland Kitchen ibuprofen (ADVIL,MOTRIN) 200 MG tablet Take 600 mg by mouth every 6 (  six) hours as needed.       No current facility-administered medications on file prior to visit.      Review of Systems Review of Systems  Constitutional: Negative for fever, appetite change,  and unexpected weight change. pos for fatigue Eyes: Negative for pain and visual disturbance.  Respiratory: Negative for cough and shortness of breath.   Cardiovascular: Negative for cp or pnd/ orthopnea or pedal edema    Gastrointestinal: Negative for nausea, diarrhea and constipation.  Genitourinary: Negative for urgency and frequency.  Skin: Negative for pallor or rash   Neurological: Negative for weakness, light-headedness, numbness and headaches.  Hematological: Negative for adenopathy. Does not bruise/bleed easily.  Psychiatric/Behavioral: pos for dysphoric mood w/o SI, neg for anxiety        Objective:   Physical Exam  Constitutional: She appears well-developed  and well-nourished. No distress.  Well appearing   HENT:  Head: Normocephalic and atraumatic.  Mouth/Throat: Oropharynx is clear and moist.  Eyes: Conjunctivae and EOM are normal. Pupils are equal, round, and reactive to light.  Neck: Normal range of motion. Neck supple. No JVD present. Carotid bruit is not present. No thyromegaly present.  Cardiovascular: Normal rate, regular rhythm, normal heart sounds and intact distal pulses.  Exam reveals no gallop.   Pulmonary/Chest: Effort normal and breath sounds normal. No respiratory distress. She has no wheezes. She has no rales.  No crackles  Abdominal: Soft. Bowel sounds are normal. She exhibits no distension, no abdominal bruit and no mass. There is no tenderness.  Musculoskeletal: She exhibits no edema.  Lymphadenopathy:    She has no cervical adenopathy.  Neurological: She is alert. She has normal reflexes. She displays no tremor. No cranial nerve deficit. She exhibits normal muscle tone. Coordination normal.  Skin: Skin is warm and dry. No rash noted.  Acne is very well controlled Few comedones -forehead and chin No pustules   Psychiatric: Her speech is normal and behavior is normal. Thought content normal. Her mood appears not anxious. Her affect is not blunt, not labile and not inappropriate. She exhibits a depressed mood.  Pt seems at most a little depressed-not tearful  Talkative and shares her stressors freely  Animated features Good insight           Assessment & Plan:   Problem List Items Addressed This Visit      Cardiovascular and Mediastinum   Family history of sudden cardiac death    Pt has had palpitations occ for years - did ask her to cut out caffeine  Re assuring ekg Will cc to Dr Graciela HusbandsKlein  Reassuring exam        Musculoskeletal and Integument   Acne vulgaris    Doing very well -still on doxycycline with retinoid and benzyol peroxide May consider d/c doxy after her wedding in April Has some stress acne    Will follow       Relevant Medications   doxycycline (VIBRA-TABS) 100 MG tablet   tretinoin (RETIN-A) 0.025 % cream     Other   Palpitations - Primary    On and off for years- at night in bed -brief Worse during times of stress Re assuring ekg Will cc to Dr Graciela HusbandsKlein now in light of fam hx of sudden cardiac death in her brother  She will cut caffeine  Can pursue cardiac w/u if needed       Relevant Orders   EKG 12-Lead (Completed)   Grief reaction    Sudden loss of  brother and co worker Reviewed stressors/ coping techniques/symptoms/ support sources/ tx options and side effects in detail today She has emotional eating issues-otherwise good coping skills Plans to return to exercise Trial of wellbutrin to help the vegetative symptoms Discussed expectations of this medication including time to effectiveness and mechanism of action, also poss of side effects (early and late)- including mental fuzziness, weight or appetite change, nausea and poss of worse dep or anxiety (even suicidal thoughts)  Pt voiced understanding and will stop med and update if this occurs    >25 minutes spent in face to face time with patient, >50% spent in counselling or coordination of care  She will stay in contact Stressed imp of self care and socialization

## 2016-06-25 NOTE — Assessment & Plan Note (Signed)
On and off for years- at night in bed -brief Worse during times of stress Re assuring ekg Will cc to Dr Graciela HusbandsKlein now in light of fam hx of sudden cardiac death in her brother  She will cut caffeine  Can pursue cardiac w/u if needed

## 2016-06-25 NOTE — Patient Instructions (Addendum)
Cut caffeine by one cup per week  Then switch to decaf  This should help palpitations   Start the wellbutrin daily  If you have any intolerable side effects or you feel worse- stop it and let me know   Stay active Start exercising when you feel motivated

## 2016-09-09 ENCOUNTER — Encounter: Payer: Self-pay | Admitting: Family Medicine

## 2016-09-09 MED ORDER — BUPROPION HCL ER (XL) 300 MG PO TB24: 300.0000 mg | ORAL_TABLET | Freq: Every day | ORAL | 11 refills | Status: DC

## 2016-12-20 ENCOUNTER — Other Ambulatory Visit: Payer: Self-pay | Admitting: Family Medicine

## 2016-12-26 ENCOUNTER — Other Ambulatory Visit: Payer: Self-pay | Admitting: Family Medicine

## 2017-01-27 ENCOUNTER — Ambulatory Visit (INDEPENDENT_AMBULATORY_CARE_PROVIDER_SITE_OTHER): Payer: 59 | Admitting: Certified Nurse Midwife

## 2017-01-27 ENCOUNTER — Encounter: Payer: Self-pay | Admitting: Certified Nurse Midwife

## 2017-01-27 VITALS — BP 127/72 | HR 57 | Ht 65.5 in | Wt 162.8 lb

## 2017-01-27 DIAGNOSIS — N926 Irregular menstruation, unspecified: Secondary | ICD-10-CM | POA: Diagnosis not present

## 2017-01-27 LAB — POCT URINE PREGNANCY: Preg Test, Ur: POSITIVE — AB

## 2017-01-27 NOTE — Progress Notes (Signed)
GYN ENCOUNTER NOTE  Subjective:       Tamara Mills is a 26 y.o. G1P0 female here for pregnancy confirmation. She is joined by her spouse Samuel BoucheLucas.   She endorses positive home pregnancy test, nausea without vomiting, and breast tenderness. This is a planned pregnancy.   She is taking a PNV with folic acid and DHA.  Denies difficulty breathing or respiratory distress, chest pain, abdominal pain, vaginal bleeding, dysuria, change in vaginal discharge, and leg pain or swelling.   Gynecologic History  Patient's last menstrual period was 12/10/2016 (exact date).  Contraception: none  Last Pap: 2016. Results were: normal  Period Pattern: (!) Irregular  Obstetric History  OB History  Gravida Para Term Preterm AB Living  1            SAB TAB Ectopic Multiple Live Births               # Outcome Date GA Lbr Len/2nd Weight Sex Delivery Anes PTL Lv  1 Current               Past Medical History:  Diagnosis Date  . Acne   . Amenorrhea    Hx of  . Anxiety   . Eating disorder    ? eating disorder in past ( pt states just running too much)  . Hand eczema   . Underweight    Hx of    Past Surgical History:  Procedure Laterality Date  . WISDOM TOOTH EXTRACTION      Current Outpatient Prescriptions on File Prior to Visit  Medication Sig Dispense Refill  . Benzoyl Peroxide (BENZOYL PEROXIDE) 10 % LIQD Apply topically every morning.     No current facility-administered medications on file prior to visit.     Allergies  Allergen Reactions  . Ortho Tri-Cyclen [Norgestimate-Eth Estradiol]     nausea  . Nickel Rash    Social History   Social History  . Marital status: Married    Spouse name: N/A  . Number of children: N/A  . Years of education: N/A   Occupational History  . Pharmacist    Social History Main Topics  . Smoking status: Never Smoker  . Smokeless tobacco: Never Used  . Alcohol use 0.0 oz/week     Comment: occasional wine--not since pregnant  . Drug use:  No  . Sexual activity: Yes    Partners: Male    Birth control/ protection: OCP, None   Other Topics Concern  . Not on file   Social History Narrative  . No narrative on file    Family History  Problem Relation Age of Onset  . Crohn's disease Brother   . Ulcerative colitis Brother   . Sudden Cardiac Death Brother   . Hypertension Mother        had also preeclampsia    The following portions of the patient's history were reviewed and updated as appropriate: allergies, current medications, past family history, past medical history, past social history, past surgical history and problem list.  Review of Systems  Review of Systems - Negative except as noted above.  History obtained from the patient.   Objective:   BP 127/72   Pulse (!) 57   Ht 5' 5.5" (1.664 m)   Wt 162 lb 12.8 oz (73.8 kg)   LMP 12/10/2016 (Exact Date)   BMI 26.68 kg/m   Alert and oriented x 4, no apparent distress.   Positive UPT  Physical exam: not indicated.  Assessment:   1. Missed menses  - POCT urine pregnancy - US OB Transvaginal; Future  2. Irregular periods  - US OB Transvaginal; Future   Plan:   The patient has been given an overview regarding routine prenatal care.  Recommendations regarding diet and safe medication during pregnancy were given.  Prenatal testing, optional genetic testing, and ultrasound use in pregnancy were reviewed.   Reviewed red flag symptoms and when to call.   RTC x 2-3 weeks for dating/viability [place] and nurse intake.    Gunnar Bulla, CNM  A total of 20 minutes were spent face-to-face with the patient during the encounter with greater than 50% dealing with counseling and coordination of care.

## 2017-01-27 NOTE — Patient Instructions (Addendum)
Pregnancy and Travel Most pregnant woman can safely travel until the last month of the pregnancy. However, pregnant women with medical problems or problems with their pregnancy should limit or avoid travel. The best time to travel is between 14 and 28 weeks of the pregnancy. During this period, morning sickness should be minimal and other problems are less likely to develop. General travel tips Before you go: Discuss your trip with your health care provider and get examined shortly before you go. Get a copy of your medical records and be sure to take them with you. Try to get names of doctors and hospitals in the area you will be visiting. Pack your pillow if you can. Get a good night's sleep the night before you make your trip.  During your trip: Ask for locations of doctors and hospitals if you did not do this before leaving. Wear flat, comfortable shoes. Eat a balanced diet, drink a lot of fluids, and take your vitamins and supplements. Do not wear yourself out. Do not ride on a motorcycle. Rest. If you spent a lot of time traveling, lie down for 30 or more minutes with your feet slightly raised after you reach your destination.  Tips for traveling to a foreign country Before you go: Ask your health care provider if there are any medicines that are safe to take if you get diarrhea, constipation, nausea, or vomiting. Make copies of your medical records in case you lose the originals.  During your trip: Do not eat uncooked foods of any kind. Drink bottled water and do not use ice. Wash fruits and vegetables with hot, soapy water. Only drink pasteurized milk.  Tips for traveling by car Wear your seat belt properly. If you are in the front seat, sit as far away from the dashboard as possible to avoid getting hit hard if the air bag deploys in an accident. Stop about every 2 hours to use the restroom and walk around. This helps the circulation in your legs. Keep water, crackers, and  fruit in the car. Do not travel for more than 6 hours a day. Tips for traveling by bus Before making a reservation, ask whether your bus will have a restroom. Take water, crackers, and fruit with you. Get out and walk around if and when the bus stops. Move your arms and legs when seated. This helps with your circulation. Tips for traveling by train Before making a reservation, ask if your train will have a sleeping car and more than one restroom. Tips for traveling by airplane Before booking your trip, ask about the airline's rules about pregnancy. Pregnant women may be restricted from [place] after a certain time of the pregnancy. Every airline has its own rules and regulations. Ask whether the airplane cabin will be pressurized. Do not board an unpressurized plane that will fly above 7,000 ft (2,100 km). Try to get a bulkhead or an aisle seat. Wear layered clothing because the temperature in the cabin can change. Take water, crackers, and fruit with you on the airplane. Put all your medicines and medical records in your carry-on bag. Avoid drinks with caffeine and do not eat a big meal. Do not walk around the airplane to stretch your legs. Move your arms and legs while sitting to help with your circulation. Wear your seat belt at all times. Tips for traveling by cruise ship Before booking your trip, ask the following questions: Are pregnant women allowed on the cruise ship? Is there a medical facility  and health care provider on board? Does the ship dock in cities where there are health care providers and medical facilities? Before booking your trip, ask your health care provider if: It is safe for you to take medicines if you get seasick. It is safe for you to wear acupressure wristbands to prevent getting seasick. If your health care provider says it is safe, consider purchasing one. This information is not intended to replace advice given to you by your health care provider. Make sure  you discuss any questions you have with your health care provider. Document Released: 06/10/2008 Document Revised: 12/04/2015 Document Reviewed: 05/25/2013 Elsevier Interactive Patient Education  2017 [employer]  WHAT OB PATIENTS CAN EXPECT   Confirmation of pregnancy and ultrasound ordered if medically indicated-[redacted] weeks gestation  New OB (NOB) intake with nurse and New OB (NOB) labs- [redacted] weeks gestation  New OB (NOB) physical examination with provider- 11/[redacted] weeks gestation  Flu vaccine-[redacted] weeks gestation  Anatomy scan-[redacted] weeks gestation  Glucose tolerance test, blood work to test for anemia, T-dap vaccine-[redacted] weeks gestation  Vaginal swabs/cultures-STD/Group B strep-[redacted] weeks gestation  Appointments every 4 weeks until 28 weeks  Every 2 weeks from 28 weeks until 36 weeks  Weekly visits from 36 weeks until delivery  Common Medications Safe in Pregnancy  Acne:      Constipation:  Benzoyl Peroxide     Colace  Clindamycin      Dulcolax Suppository  Topica Erythromycin     Fibercon  Salicylic Acid      Metamucil         Miralax AVOID:        Senakot   Accutane    Cough:  Retin-A       Cough Drops  Tetracycline      Phenergan w/ Codeine if Rx  Minocycline      Robitussin (Plain & DM)  Antibiotics:     Crabs/Lice:  Ceclor       RID  Cephalosporins    AVOID:  E-Mycins      Kwell  Keflex  Macrobid/Macrodantin   Diarrhea:  Penicillin      Kao-Pectate  Zithromax      Imodium AD         PUSH FLUIDS AVOID:       Cipro     Fever:  Tetracycline      Tylenol (Regular or Extra  Minocycline       Strength)  Levaquin      Extra Strength-Do not          Exceed 8 tabs/24 hrs Caffeine:        <232m/day (equiv. To 1 cup of coffee or  approx. 3 12 oz sodas)         Gas: Cold/Hayfever:       Gas-X  Benadryl      Mylicon  Claritin       Phazyme  **Claritin-D        Chlor-Trimeton    Headaches:  Dimetapp      ASA-Free Excedrin  Drixoral-Non-Drowsy     Cold Compress  Mucinex  (Guaifenasin)     Tylenol (Regular or Extra  Sudafed/Sudafed-12 Hour     Strength)  **Sudafed PE Pseudoephedrine   Tylenol Cold & Sinus     Vicks Vapor Rub  Zyrtec  **AVOID if Problems With Blood Pressure         Heartburn: Avoid lying down for at least 1 hour after meals  Aciphex  Maalox     Rash:  Milk of Magnesia     Benadryl    Mylanta       1% Hydrocortisone Cream  Pepcid  Pepcid Complete   Sleep Aids:  Prevacid      Ambien   Prilosec       Benadryl  Rolaids       Chamomile Tea  Tums (Limit 4/day)     Unisom  Zantac       Tylenol PM         Warm milk-add vanilla or  Hemorrhoids:       Sugar for taste  Anusol/Anusol H.C.  (RX: Analapram 2.5%)  Sugar Substitutes:  Hydrocortisone OTC     Ok in moderation  Preparation H      Tucks        Vaseline lotion applied to tissue with wiping    Herpes:     Throat:  Acyclovir      Oragel  Famvir  Valtrex     Vaccines:         Flu Shot Leg Cramps:       *Gardasil  Benadryl      Hepatitis A         Hepatitis B Nasal Spray:       Pneumovax  Saline Nasal Spray     Polio Booster         Tetanus Nausea:       Tuberculosis test or PPD  Vitamin B6 25 mg TID   AVOID:    Dramamine      *Gardasil  Emetrol       Live Poliovirus  Ginger Root 250 mg QID    MMR (measles, mumps &  High Complex Carbs @ Bedtime    rebella)  Sea Bands-Accupressure    Varicella (Chickenpox)  Unisom 1/2 tab TID     *No known complications           If received before Pain:         Known pregnancy;   Darvocet       Resume series after  Lortab        Delivery  Percocet    Yeast:   Tramadol      Femstat  Tylenol 3      Gyne-lotrimin  Ultram       Monistat  Vicodin           MISC:         All Sunscreens           Hair Coloring/highlights          Insect Repellant's          (Including DEET)         [place] Tans Eating Plan for Pregnant Women While you are pregnant, your body will require additional nutrition to help support your growing baby.  It is recommended that you consume:  150 additional calories each day during your first trimester.  300 additional calories each day during your second trimester.  300 additional calories each day during your third trimester.  Eating a healthy, well-balanced diet is very important for your health and for your baby's health. You also have a higher need for some vitamins and minerals, such as folic acid, calcium, iron, and vitamin D. What do I need to know about eating during pregnancy?  Do not try to lose weight or go on a diet during pregnancy.  Choose healthy, nutritious foods. Choose  of a sandwich  with a glass of milk instead of a candy bar or a high-calorie sugar-sweetened beverage.  Limit your overall intake of foods that have "empty calories." These are foods that have little nutritional value, such as sweets, desserts, candies, sugar-sweetened beverages, and fried foods.  Eat a variety of foods, especially fruits and vegetables.  Take a prenatal vitamin to help meet the additional needs during pregnancy, specifically for folic acid, iron, calcium, and vitamin D.  Remember to stay active. Ask your health care provider for exercise recommendations that are specific to you.  Practice good food safety and cleanliness, such as washing your hands before you eat and after you prepare raw meat. This helps to prevent foodborne illnesses, such as listeriosis, that can be very dangerous for your baby. Ask your health care provider for more information about listeriosis. What does 150 extra calories look like? Healthy options for an additional 150 calories each day could be any of the following:  Plain low-fat yogurt (6-8 oz) with  cup of berries.  1 apple with 2 teaspoons of peanut butter.  Cut-up vegetables with  cup of hummus.  Low-fat chocolate milk (8 oz or 1 cup).  1 string cheese with 1 medium orange.   of a peanut butter and jelly sandwich on whole-wheat bread (1 tsp of  peanut butter).  For 300 calories, you could eat two of those healthy options each day. What is a healthy amount of weight to gain? The recommended amount of weight for you to gain is based on your pre-pregnancy BMI. If your pre-pregnancy BMI was:  Less than 18 (underweight), you should gain 28-40 lb.  18-24.9 (normal), you should gain 25-35 lb.  25-29.9 (overweight), you should gain 15-25 lb.  Greater than 30 (obese), you should gain 11-20 lb.  What if I am having twins or multiples? Generally, pregnant women who will be having twins or multiples may need to increase their daily calories by 300-600 calories each day. The recommended range for total weight gain is 25-54 lb, depending on your pre-pregnancy BMI. Talk with your health care provider for specific guidance about additional nutritional needs, weight gain, and exercise during your pregnancy. What foods can I eat? Grains Any grains. Try to choose whole grains, such as whole-wheat bread, oatmeal, or brown rice. Vegetables Any vegetables. Try to eat a variety of colors and types of vegetables to get a full range of vitamins and minerals. Remember to wash your vegetables well before eating. Fruits Any fruits. Try to eat a variety of colors and types of fruit to get a full range of vitamins and minerals. Remember to wash your fruits well before eating. Meats and Other Protein Sources Lean meats, including chicken, [place], fish, and lean cuts of beef, veal, or pork. Make sure that all meats are cooked to "well done." Tofu. Tempeh. Beans. Eggs. Peanut butter and other nut butters. Seafood, such as shrimp, crab, and lobster. If you choose fish, select types that are higher in omega-3 fatty acids, including salmon, herring, mussels, trout, sardines, and pollock. Make sure that all meats are cooked to food-safe temperatures. Dairy Pasteurized milk and milk alternatives. Pasteurized yogurt and pasteurized cheese. Cottage cheese. Sour  cream. Beverages Water. Juices that contain 100% fruit juice or vegetable juice. Caffeine-free teas and decaffeinated coffee. Drinks that contain caffeine are okay to drink, but it is better to avoid caffeine. Keep your total caffeine intake to less than 200 mg each day (12 oz of coffee, tea, or soda) or as  directed by your health care provider. Condiments Any pasteurized condiments. Sweets and Desserts Any sweets and desserts. Fats and Oils Any fats and oils. The items listed above may not be a complete list of recommended foods or beverages. Contact your dietitian for more options. What foods are not recommended? Vegetables Unpasteurized (raw) vegetable juices. Fruits Unpasteurized (raw) fruit juices. Meats and Other Protein Sources Cured meats that have nitrates, such as bacon, salami, and hotdogs. Luncheon meats, bologna, or other deli meats (unless they are reheated until they are steaming hot). Refrigerated pate, meat spreads from a meat counter, smoked seafood that is found in the refrigerated section of a store. Raw fish, such as sushi or sashimi. High mercury content fish, such as tilefish, shark, swordfish, and king mackerel. Raw meats, such as tuna or beef tartare. Undercooked meats and poultry. Make sure that all meats are cooked to food-safe temperatures. Dairy Unpasteurized (raw) milk and any foods that have raw milk in them. Soft cheeses, such as feta, queso blanco, queso fresco, Brie, Camembert cheeses, blue-veined cheeses, and Panela cheese (unless it is made with pasteurized milk, which must be stated on the label). Beverages Alcohol. Sugar-sweetened beverages, such as sodas, teas, or energy drinks. Condiments Homemade fermented foods and drinks, such as pickles, sauerkraut, or kombucha drinks. (Store-bought pasteurized versions of these are okay.) Other Salads that are made in the store, such as ham salad, chicken salad, egg salad, tuna salad, and seafood salad. The items  listed above may not be a complete list of foods and beverages to avoid. Contact your dietitian for more information. This information is not intended to replace advice given to you by your health care provider. Make sure you discuss any questions you have with your health care provider. Document Released: 04/12/2014 Document Revised: 12/04/2015 Document Reviewed: 12/11/2013 Elsevier Interactive Patient Education  2018 [employer]. Prenatal Care WHAT IS PRENATAL CARE? Prenatal care is the process of caring for a pregnant woman before she gives birth. Prenatal care makes sure that she and her baby remain as healthy as possible throughout pregnancy. Prenatal care may be provided by a midwife, family practice health care provider, or a childbirth and pregnancy specialist (obstetrician). Prenatal care may include physical examinations, testing, treatments, and education on nutrition, lifestyle, and social support services. WHY IS PRENATAL CARE SO IMPORTANT? Early and consistent prenatal care increases the chance that you and your baby will remain healthy throughout your pregnancy. This type of care also decreases a baby's risk of being born too early (prematurely), or being born smaller than expected (small for gestational age). Any underlying medical conditions you may have that could pose a risk during your pregnancy are discussed during prenatal care visits. You will also be monitored regularly for any new conditions that may arise during your pregnancy so they can be treated quickly and effectively. WHAT HAPPENS DURING PRENATAL CARE VISITS? Prenatal care visits may include the following: Discussion Tell your health care provider about any new signs or symptoms you have experienced since your last visit. These might include:  Nausea or vomiting.  Increased or decreased level of energy.  Difficulty sleeping.  Back or leg pain.  Weight changes.  Frequent urination.  Shortness of breath with  physical activity.  Changes in your skin, such as the development of a rash or itchiness.  Vaginal discharge or bleeding.  Feelings of excitement or nervousness.  Changes in your baby's movements.  You may want to write down any questions or topics you want to  discuss with your health care provider and bring them with you to your appointment. Examination During your first prenatal care visit, you will likely have a complete physical exam. Your health care provider will often examine your vagina, cervix, and the position of your uterus, as well as check your heart, lungs, and other body systems. As your pregnancy progresses, your health care provider will measure the size of your uterus and your baby's position inside your uterus. He or she may also examine you for early signs of labor. Your prenatal visits may also include checking your blood pressure and, after about 10-12 weeks of pregnancy, listening to your baby's heartbeat. Testing Regular testing often includes:  Urinalysis. This checks your urine for glucose, protein, or signs of infection.  Blood count. This checks the levels of white and red blood cells in your body.  Tests for sexually transmitted infections (STIs). Testing for STIs at the beginning of pregnancy is routinely done and is required in many states.  Antibody testing. You will be checked to see if you are immune to certain illnesses, such as rubella, that can affect a developing fetus.  Glucose screen. Around 24-28 weeks of pregnancy, your blood glucose level will be checked for signs of gestational diabetes. Follow-up tests may be recommended.  Group B strep. This is a bacteria that is commonly found inside a woman's vagina. This test will inform your health care provider if you need an antibiotic to reduce the amount of this bacteria in your body prior to labor and childbirth.  Ultrasound. Many pregnant women undergo an ultrasound screening around 18-20 weeks of  pregnancy to evaluate the health of the fetus and check for any developmental abnormalities.  HIV (human immunodeficiency virus) testing. Early in your pregnancy, you will be screened for HIV. If you are at high risk for HIV, this test may be repeated during your third trimester of pregnancy.  You may be offered other testing based on your age, personal or family medical history, or other factors. HOW OFTEN SHOULD I PLAN TO SEE MY HEALTH CARE PROVIDER FOR PRENATAL CARE? Your prenatal care check-up schedule depends on any medical conditions you have before, or develop during, your pregnancy. If you do not have any underlying medical conditions, you will likely be seen for checkups:  Monthly, during the first 6 months of pregnancy.  Twice a month during months 7 and 8 of pregnancy.  Weekly starting in the 9th month of pregnancy and until delivery.  If you develop signs of early labor or other concerning signs or symptoms, you may need to see your health care provider more often. Ask your health care provider what prenatal care schedule is best for you. WHAT CAN I DO TO KEEP MYSELF AND MY BABY AS HEALTHY AS POSSIBLE DURING MY PREGNANCY?  Take a prenatal vitamin containing 400 micrograms (0.4 mg) of folic acid every day. Your health care provider may also ask you to take additional vitamins such as iodine, vitamin D, iron, copper, and zinc.  Take 1500-2000 mg of calcium daily starting at your 20th week of pregnancy until you deliver your baby.  Make sure you are up to date on your vaccinations. Unless directed otherwise by your health care provider: ? You should receive a tetanus, diphtheria, and pertussis (Tdap) vaccination between the 27th and 36th week of your pregnancy, regardless of when your last Tdap immunization occurred. This helps protect your baby from whooping cough (pertussis) after he or she is born. ? You  should receive an annual inactivated influenza vaccine (IIV) to help protect  you and your baby from influenza. This can be done at any point during your pregnancy.  Eat a well-rounded diet that includes: ? Fresh fruits and vegetables. ? Lean proteins. ? Calcium-rich foods such as milk, yogurt, hard cheeses, and dark, leafy greens. ? Whole grain breads.  Do noteat seafood high in mercury, including: ? Swordfish. ? Tilefish. ? Shark. ? King mackerel. ? More than 6 oz tuna per week.  Do not eat: ? Raw or undercooked meats or eggs. ? Unpasteurized foods, such as soft cheeses (brie, blue, or feta), juices, and milks. ? Lunch meats. ? Hot dogs that have not been heated until they are steaming.  Drink enough water to keep your urine clear or pale yellow. For many women, this may be 10 or more 8 oz glasses of water each day. Keeping yourself hydrated helps deliver nutrients to your baby and may prevent the start of pre-term uterine contractions.  Do not use any tobacco products including cigarettes, chewing tobacco, or electronic cigarettes. If you need help quitting, ask your health care provider.  Do not drink beverages containing alcohol. No safe level of alcohol consumption during pregnancy has been determined.  Do not use any illegal drugs. These can harm your developing baby or cause a miscarriage.  Ask your health care provider or pharmacist before taking any prescription or over-the-counter medicines, herbs, or supplements.  Limit your caffeine intake to no more than 200 mg per day.  Exercise. Unless told otherwise by your health care provider, try to get 30 minutes of moderate exercise most days of the week. Do not  do high-impact activities, contact sports, or activities with a high risk of falling, such as horseback riding or downhill skiing.  Get plenty of rest.  Avoid anything that raises your body temperature, such as hot tubs and saunas.  If you own a cat, do not empty its litter box. Bacteria contained in cat feces can cause an infection called  toxoplasmosis. This can result in serious harm to the fetus.  Stay away from chemicals such as insecticides, lead, mercury, and cleaning or paint products that contain solvents.  Do not have any X-rays taken unless medically necessary.  Take a childbirth and breastfeeding preparation class. Ask your health care provider if you need a referral or recommendation.  This information is not intended to replace advice given to you by your health care provider. Make sure you discuss any questions you have with your health care provider. Document Released: 07/01/2003 Document Revised: 12/01/2015 Document Reviewed: 09/12/2013 Elsevier Interactive Patient Education  2017 [place] of Pregnancy The first trimester of pregnancy is from week 1 until the end of week 13 (months 1 through 3). A week after a sperm fertilizes an egg, the egg will implant on the wall of the uterus. This embryo will begin to develop into a baby. Genes from you and your partner will form the baby. The female genes will determine whether the baby will be a boy or a girl. At 6-8 weeks, the eyes and face will be formed, and the heartbeat can be seen on ultrasound. At the end of 12 weeks, all the baby's organs will be formed. Now that you are pregnant, you will want to do everything you can to have a healthy baby. Two of the most important things are to get good prenatal care and to follow your health care provider's instructions. Prenatal care  is all the medical care you receive before the baby's birth. This care will help prevent, find, and treat any problems during the pregnancy and childbirth. Body changes during your first trimester Your body goes through many changes during pregnancy. The changes vary from woman to woman.  You may gain or lose a couple of pounds at first.  You may feel sick to your stomach (nauseous) and you may throw up (vomit). If the vomiting is uncontrollable, call your health care  provider.  You may tire easily.  You may develop headaches that can be relieved by medicines. All medicines should be approved by your health care provider.  You may urinate more often. Painful urination may mean you have a bladder infection.  You may develop heartburn as a result of your pregnancy.  You may develop constipation because certain hormones are causing the muscles that push stool through your intestines to slow down.  You may develop hemorrhoids or swollen veins (varicose veins).  Your breasts may begin to grow larger and become tender. Your nipples may stick out more, and the tissue that surrounds them (areola) may become darker.  Your gums may bleed and may be sensitive to brushing and flossing.  Dark spots or blotches (chloasma, mask of pregnancy) may develop on your face. This will likely fade after the baby is born.  Your menstrual periods will stop.  You may have a loss of appetite.  You may develop cravings for certain kinds of food.  You may have changes in your emotions from day to day, such as being excited to be pregnant or being concerned that something may go wrong with the pregnancy and baby.  You may have more vivid and strange dreams.  You may have changes in your hair. These can include thickening of your hair, rapid growth, and changes in texture. Some women also have hair loss during or after pregnancy, or hair that feels dry or thin. Your hair will most likely return to normal after your baby is born.  What to expect at prenatal visits During a routine prenatal visit:  You will be weighed to make sure you and the baby are growing normally.  Your blood pressure will be taken.  Your abdomen will be measured to track your baby's growth.  The fetal heartbeat will be listened to between weeks 10 and 14 of your pregnancy.  Test results from any previous visits will be discussed.  Your health care provider may ask you:  How you are  feeling.  If you are feeling the baby move.  If you have had any abnormal symptoms, such as leaking fluid, bleeding, severe headaches, or abdominal cramping.  If you are using any tobacco products, including cigarettes, chewing tobacco, and electronic cigarettes.  If you have any questions.  Other tests that may be performed during your first trimester include:  Blood tests to find your blood type and to check for the presence of any previous infections. The tests will also be used to check for low iron levels (anemia) and protein on red blood cells (Rh antibodies). Depending on your risk factors, or if you previously had diabetes during pregnancy, you may have tests to check for high blood sugar that affects pregnant women (gestational diabetes).  Urine tests to check for infections, diabetes, or protein in the urine.  An ultrasound to confirm the proper growth and development of the baby.  Fetal screens for spinal cord problems (spina bifida) and Down syndrome.  HIV (human  immunodeficiency virus) testing. Routine prenatal testing includes screening for HIV, unless you choose not to have this test.  You may need other tests to make sure you and the baby are doing well.  Follow these instructions at home: Medicines  Follow your health care provider's instructions regarding medicine use. Specific medicines may be either safe or unsafe to take during pregnancy.  Take a prenatal vitamin that contains at least 600 micrograms (mcg) of folic acid.  If you develop constipation, try taking a stool softener if your health care provider approves. Eating and drinking  Eat a balanced diet that includes fresh fruits and vegetables, whole grains, good sources of protein such as meat, eggs, or tofu, and low-fat dairy. Your health care provider will help you determine the amount of weight gain that is right for you.  Avoid raw meat and uncooked cheese. These carry germs that can cause birth  defects in the baby.  Eating four or five small meals rather than three large meals a day may help relieve nausea and vomiting. If you start to feel nauseous, eating a few soda crackers can be helpful. Drinking liquids between meals, instead of during meals, also seems to help ease nausea and vomiting.  Limit foods that are high in fat and processed sugars, such as fried and sweet foods.  To prevent constipation: ? Eat foods that are high in fiber, such as fresh fruits and vegetables, whole grains, and beans. ? Drink enough fluid to keep your urine clear or pale yellow. Activity  Exercise only as directed by your health care provider. Most women can continue their usual exercise routine during pregnancy. Try to exercise for 30 minutes at least 5 days a week. Exercising will help you: ? Control your weight. ? Stay in shape. ? Be prepared for labor and delivery.  Experiencing pain or cramping in the lower abdomen or lower back is a good sign that you should stop exercising. Check with your health care provider before continuing with normal exercises.  Try to avoid standing for long periods of time. Move your legs often if you must stand in one place for a long time.  Avoid heavy lifting.  Wear low-heeled shoes and practice good posture.  You may continue to have sex unless your health care provider tells you not to. Relieving pain and discomfort  Wear a good support bra to relieve breast tenderness.  Take warm sitz baths to soothe any pain or discomfort caused by hemorrhoids. Use hemorrhoid cream if your health care provider approves.  Rest with your legs elevated if you have leg cramps or low back pain.  If you develop varicose veins in your legs, wear support hose. Elevate your feet for 15 minutes, 3-4 times a day. Limit salt in your diet. Prenatal care  Schedule your prenatal visits by the twelfth week of pregnancy. They are usually scheduled monthly at first, then more often in  the last 2 months before delivery.  Write down your questions. Take them to your prenatal visits.  Keep all your prenatal visits as told by your health care provider. This is important. Safety  Wear your seat belt at all times when driving.  Make a list of emergency phone numbers, including numbers for family, friends, the hospital, and police and fire departments. General instructions  Ask your health care provider for a referral to a local prenatal education class. Begin classes no later than the beginning of month 6 of your pregnancy.  Ask for help  if you have counseling or nutritional needs during pregnancy. Your health care provider can offer advice or refer you to specialists for help with various needs.  Do not use hot tubs, steam rooms, or saunas.  Do not douche or use tampons or scented sanitary pads.  Do not cross your legs for long periods of time.  Avoid cat litter boxes and soil used by cats. These carry germs that can cause birth defects in the baby and possibly loss of the fetus by miscarriage or stillbirth.  Avoid all smoking, herbs, alcohol, and medicines not prescribed by your health care provider. Chemicals in these products affect the formation and growth of the baby.  Do not use any products that contain nicotine or tobacco, such as cigarettes and e-cigarettes. If you need help quitting, ask your health care provider. You may receive counseling support and other resources to help you quit.  Schedule a dentist appointment. At home, brush your teeth with a soft toothbrush and be gentle when you floss. Contact a health care provider if:  You have dizziness.  You have mild pelvic cramps, pelvic pressure, or nagging pain in the abdominal area.  You have persistent nausea, vomiting, or diarrhea.  You have a bad smelling vaginal discharge.  You have pain when you urinate.  You notice increased swelling in your face, hands, legs, or ankles.  You are exposed to  fifth disease or chickenpox.  You are exposed to [place] measles (rubella) and have never had it. Get help right away if:  You have a fever.  You are leaking fluid from your vagina.  You have spotting or bleeding from your vagina.  You have severe abdominal cramping or pain.  You have rapid weight gain or loss.  You vomit blood or material that looks like coffee grounds.  You develop a severe headache.  You have shortness of breath.  You have any kind of trauma, such as from a fall or a car accident. Summary  The first trimester of pregnancy is from week 1 until the end of week 13 (months 1 through 3).  Your body goes through many changes during pregnancy. The changes vary from woman to woman.  You will have routine prenatal visits. During those visits, your health care provider will examine you, discuss any test results you may have, and talk with you about how you are feeling. This information is not intended to replace advice given to you by your health care provider. Make sure you discuss any questions you have with your health care provider. Document Released: 06/22/2001 Document Revised: 06/09/2016 Document Reviewed: 06/09/2016 [employer] Interactive Patient Education  2017 [employer].

## 2017-02-10 ENCOUNTER — Other Ambulatory Visit: Payer: 59

## 2017-02-14 ENCOUNTER — Ambulatory Visit (INDEPENDENT_AMBULATORY_CARE_PROVIDER_SITE_OTHER): Payer: 59

## 2017-02-14 ENCOUNTER — Ambulatory Visit (INDEPENDENT_AMBULATORY_CARE_PROVIDER_SITE_OTHER): Payer: 59 | Admitting: Certified Nurse Midwife

## 2017-02-14 VITALS — BP 126/70 | HR 61 | Ht 65.5 in | Wt 165.6 lb

## 2017-02-14 DIAGNOSIS — N926 Irregular menstruation, unspecified: Secondary | ICD-10-CM | POA: Diagnosis not present

## 2017-02-14 DIAGNOSIS — Z113 Encounter for screening for infections with a predominantly sexual mode of transmission: Secondary | ICD-10-CM | POA: Diagnosis not present

## 2017-02-14 DIAGNOSIS — Z1389 Encounter for screening for other disorder: Secondary | ICD-10-CM | POA: Diagnosis not present

## 2017-02-14 DIAGNOSIS — Z3401 Encounter for supervision of normal first pregnancy, first trimester: Secondary | ICD-10-CM

## 2017-02-14 NOTE — Patient Instructions (Signed)
Pregnancy and Zika Virus Disease Zika virus disease, or Zika, is an illness that can spread to people from mosquitoes that carry the virus. It may also spread from person to person through infected body fluids. Zika first occurred in [place], but recently it has spread to new areas. The virus occurs in tropical climates. The location of Zika continues to change. Most people who become infected with Zika virus do not develop serious illness. However, Zika may cause birth defects in an unborn baby whose mother is infected with the virus. It may also increase the risk of miscarriage. What are the symptoms of Zika virus disease? In many cases, people who have been infected with Zika virus do not develop any symptoms. If symptoms appear, they usually start about a week after the person is infected. Symptoms are usually mild. They may include:  Fever.  Rash.  Red eyes.  Joint pain.  How does Zika virus disease spread? The main way that Zika virus spreads is through the bite of a certain type of mosquito. Unlike most types of mosquitos, which bite only at night, the type of mosquito that carries Zika virus bites both at night and during the day. Zika virus can also spread through sexual contact, through a blood transfusion, and from a mother to her baby before or during birth. Once you have had Zika virus disease, it is unlikely that you will get it again. Can I pass Zika to my baby during pregnancy? Yes, Zika can pass from a mother to her baby before or during birth. What problems can Zika cause for my baby? A woman who is infected with Zika virus while pregnant is at risk of having her baby born with a condition in which the brain or head is smaller than expected (microcephaly). Babies who have microcephaly can have developmental delays, seizures, hearing problems, and vision problems. Having Zika virus disease during pregnancy can also increase the risk of miscarriage. How can Zika virus disease be  prevented? There is no vaccine to prevent Zika. The best way to prevent the disease is to avoid infected mosquitoes and avoid exposure to body fluids that can spread the virus. Avoid any possible exposure to Zika by taking the following precautions. For women and their sex partners:  Avoid traveling to high-risk areas. The locations where Zika is being reported change often. To identify high-risk areas, check the CDC travel website: www.cdc.gov/zika/geo/index.html  If you or your sex partner must travel to a high-risk area, talk with a health care provider before and after traveling.  Take all precautions to avoid mosquito bites if you live in, or travel to, any of the high-risk areas. Insect repellents are safe to use during pregnancy.  Ask your health care provider when it is safe to have sexual contact.  For women:  If you are pregnant or trying to become pregnant, avoid sexual contact with persons who may have been exposed to Zika virus, persons who have possible symptoms of Zika, or persons whose history you are unsure about. If you choose to have sexual contact with someone who may have been exposed to Zika virus, use condoms correctly during the entire duration of sexual activity, every time. Do not share sexual devices, as you may be exposed to body fluids.  Ask your health care provider about when it is safe to attempt pregnancy after a possible exposure to Zika virus.  What steps should I take to avoid mosquito bites? Take these steps to avoid mosquito bites   when you are in a high-risk area:  Wear loose clothing that covers your arms and legs.  Limit your outdoor activities.  Do not open windows unless they have window screens.  Sleep under mosquito nets.  Use insect repellent. The best insect repellents have:  DEET, picaridin, oil of lemon eucalyptus (OLE), or IR3535 in them.  Higher amounts of an active ingredient in them.  Remember that insect repellents are safe to  use during pregnancy.  Do not use OLE on children who are younger than 3 years of age. Do not use insect repellent on babies who are younger than 2 months of age.  Cover your child's stroller with mosquito netting. Make sure the netting fits snugly and that any loose netting does not cover your child's mouth or nose. Do not use a blanket as a mosquito-protection cover.  Do not apply insect repellent underneath clothing.  If you are using sunscreen, apply the sunscreen before applying the insect repellent.  Treat clothing with permethrin. Do not apply permethrin directly to your skin. Follow label directions for safe use.  Get rid of standing water, where mosquitoes may reproduce. Standing water is often found in items such as buckets, bowls, animal food dishes, and flowerpots.  When you return from traveling to any high-risk area, continue taking actions to protect yourself against mosquito bites for 3 weeks, even if you show no signs of illness. This will prevent spreading Zika virus to uninfected mosquitoes. What should I know about the sexual transmission of Zika? People can spread Zika to their sexual partners during vaginal, anal, or oral sex, or by sharing sexual devices. Many people with Zika do not develop symptoms, so a person could spread the disease without knowing that they are infected. The greatest risk is to women who are pregnant or who may become pregnant. Zika virus can live longer in semen than it can live in blood. Couples can prevent sexual transmission of the virus by:  Using condoms correctly during the entire duration of sexual activity, every time. This includes vaginal, anal, and oral sex.  Not sharing sexual devices. Sharing increases your risk of being exposed to body fluid from another person.  Avoiding all sexual activity until your health care provider says it is safe.  Should I be tested for Zika virus? A sample of your blood can be tested for Zika virus. A  pregnant woman should be tested if she may have been exposed to the virus or if she has symptoms of Zika. She may also have additional tests done during her pregnancy, such ultrasound testing. Talk with your health care provider about which tests are recommended. This information is not intended to replace advice given to you by your health care provider. Make sure you discuss any questions you have with your health care provider. Document Released: 03/19/2015 Document Revised: 12/04/2015 Document Reviewed: 03/12/2015 Elsevier Interactive Patient Education  2018 [employer] Hyperemesis Gravidarum Hyperemesis gravidarum is a severe form of nausea and vomiting that happens during pregnancy. Hyperemesis is worse than morning sickness. It may cause you to have nausea or vomiting all day for many days. It may keep you from eating and drinking enough food and liquids. Hyperemesis usually occurs during the first half (the first 20 weeks) of pregnancy. It often goes away once a woman is in her second half of pregnancy. However, sometimes hyperemesis continues through an entire pregnancy. What are the causes? The cause of this condition is not known. It may be related   to changes in chemicals (hormones) in the body during pregnancy, such as the high level of pregnancy hormone (human chorionic gonadotropin) or the increase in the female sex hormone (estrogen). What are the signs or symptoms? Symptoms of this condition include:  Severe nausea and vomiting.  Nausea that does not go away.  Vomiting that does not allow you to keep any food down.  Weight loss.  Body fluid loss (dehydration).  Having no desire to eat, or not liking food that you have previously enjoyed.  How is this diagnosed? This condition may be diagnosed based on:  A physical exam.  Your medical history.  Your symptoms.  Blood tests.  Urine tests.  How is this treated? This condition may be managed with medicine. If  medicines to do not help relieve nausea and vomiting, you may need to receive fluids through an IV tube at the hospital. Follow these instructions at home:  Take over-the-counter and prescription medicines only as told by your health care provider.  Avoid iron pills and multivitamins that contain iron for the first 3-4 months of pregnancy. If you take prescription iron pills, do not stop taking them unless your health care provider approves.  Take the following actions to help prevent nausea and vomiting: ? In the morning, before getting out of bed, try eating a couple of dry crackers or a piece of toast. ? Avoid foods and smells that upset your stomach. Fatty and spicy foods may make nausea worse. ? Eat 5-6 small meals a day. ? Do not drink fluids while eating meals. Drink between meals. ? Eat or suck on things that have ginger in them. Ginger can help relieve nausea. ? Avoid food preparation. The smell of food can spoil your appetite or trigger nausea.  Follow instructions from your health care provider about eating or drinking restrictions.  For snacks, eat high-protein foods, such as cheese.  Keep all follow-up and pre-birth (prenatal) visits as told by your health care provider. This is important. Contact a health care provider if:  You have pain in your abdomen.  You have a severe headache.  You have vision problems.  You are losing weight. Get help right away if:  You cannot drink fluids without vomiting.  You vomit blood.  You have constant nausea and vomiting.  You are very weak.  You are very thirsty.  You feel dizzy.  You faint.  You have a fever or other symptoms that last for more than 2-3 days.  You have a fever and your symptoms suddenly get worse. Summary  Hyperemesis gravidarum is a severe form of nausea and vomiting that happens during pregnancy.  Making some changes to your eating habits may help relieve nausea and vomiting.  This condition may  be managed with medicine.  If medicines to do not help relieve nausea and vomiting, you may need to receive fluids through an IV tube at the hospital. This information is not intended to replace advice given to you by your health care provider. Make sure you discuss any questions you have with your health care provider. Document Released: 06/28/2005 Document Revised: 02/25/2016 Document Reviewed: 02/25/2016 Elsevier Interactive Patient Education  2017 [employer] First Trimester of Pregnancy The first trimester of pregnancy is from week 1 until the end of week 13 (months 1 through 3). During this time, your baby will begin to develop inside you. At 6-8 weeks, the eyes and face are formed, and the heartbeat can be seen on ultrasound. At the   end of 12 weeks, all the baby's organs are formed. Prenatal care is all the medical care you receive before the birth of your baby. Make sure you get good prenatal care and follow all of your doctor's instructions. Follow these instructions at home: Medicines  Take over-the-counter and prescription medicines only as told by your doctor. Some medicines are safe and some medicines are not safe during pregnancy.  Take a prenatal vitamin that contains at least 600 micrograms (mcg) of folic acid.  If you have trouble pooping (constipation), take medicine that will make your stool soft (stool softener) if your doctor approves. Eating and drinking  Eat regular, healthy meals.  Your doctor will tell you the amount of weight gain that is right for you.  Avoid raw meat and uncooked cheese.  If you feel sick to your stomach (nauseous) or throw up (vomit): ? Eat 4 or 5 small meals a day instead of 3 large meals. ? Try eating a few soda crackers. ? Drink liquids between meals instead of during meals.  To prevent constipation: ? Eat foods that are high in fiber, like fresh fruits and vegetables, whole grains, and beans. ? Drink enough fluids to keep your pee  (urine) clear or pale yellow. Activity  Exercise only as told by your doctor. Stop exercising if you have cramps or pain in your lower belly (abdomen) or low back.  Do not exercise if it is too hot, too humid, or if you are in a place of great height (high altitude).  Try to avoid standing for long periods of time. Move your legs often if you must stand in one place for a long time.  Avoid heavy lifting.  Wear low-heeled shoes. Sit and stand up straight.  You can have sex unless your doctor tells you not to. Relieving pain and discomfort  Wear a good support bra if your breasts are sore.  Take warm water baths (sitz baths) to soothe pain or discomfort caused by hemorrhoids. Use hemorrhoid cream if your doctor says it is okay.  Rest with your legs raised if you have leg cramps or low back pain.  If you have puffy, bulging veins (varicose veins) in your legs: ? Wear support hose or compression stockings as told by your doctor. ? Raise (elevate) your feet for 15 minutes, 3-4 times a day. ? Limit salt in your food. Prenatal care  Schedule your prenatal visits by the twelfth week of pregnancy.  Write down your questions. Take them to your prenatal visits.  Keep all your prenatal visits as told by your doctor. This is important. Safety  Wear your seat belt at all times when driving.  Make a list of emergency phone numbers. The list should include numbers for family, friends, the hospital, and police and fire departments. General instructions  Ask your doctor for a referral to a local prenatal class. Begin classes no later than at the start of month 6 of your pregnancy.  Ask for help if you need counseling or if you need help with nutrition. Your doctor can give you advice or tell you where to go for help.  Do not use hot tubs, steam rooms, or saunas.  Do not douche or use tampons or scented sanitary pads.  Do not cross your legs for long periods of time.  Avoid all herbs  and alcohol. Avoid drugs that are not approved by your doctor.  Do not use any tobacco products, including cigarettes, chewing tobacco, and electronic cigarettes.   If you need help quitting, ask your doctor. You may get counseling or other support to help you quit.  Avoid cat litter boxes and soil used by cats. These carry germs that can cause birth defects in the baby and can cause a loss of your baby (miscarriage) or stillbirth.  Visit your dentist. At home, brush your teeth with a soft toothbrush. Be gentle when you floss. Contact a doctor if:  You are dizzy.  You have mild cramps or pressure in your lower belly.  You have a nagging pain in your belly area.  You continue to feel sick to your stomach, you throw up, or you have watery poop (diarrhea).  You have a bad smelling fluid coming from your vagina.  You have pain when you pee (urinate).  You have increased puffiness (swelling) in your face, hands, legs, or ankles. Get help right away if:  You have a fever.  You are leaking fluid from your vagina.  You have spotting or bleeding from your vagina.  You have very bad belly cramping or pain.  You gain or lose weight rapidly.  You throw up blood. It may look like coffee grounds.  You are around people who have German measles, fifth disease, or chickenpox.  You have a very bad headache.  You have shortness of breath.  You have any kind of trauma, such as from a fall or a car accident. Summary  The first trimester of pregnancy is from week 1 until the end of week 13 (months 1 through 3).  To take care of yourself and your unborn baby, you will need to eat healthy meals, take medicines only if your doctor tells you to do so, and do activities that are safe for you and your baby.  Keep all follow-up visits as told by your doctor. This is important as your doctor will have to ensure that your baby is healthy and growing well. This information is not intended to replace  advice given to you by your health care provider. Make sure you discuss any questions you have with your health care provider. Document Released: 12/15/2007 Document Revised: 07/06/2016 Document Reviewed: 07/06/2016 Elsevier Interactive Patient Education  2017 [employer] Commonly Asked Questions During Pregnancy  Cats: A parasite can be excreted in cat feces.  To avoid exposure you need to have another person empty the little box.  If you must empty the litter box you will need to wear gloves.  Wash your hands after handling your cat.  This parasite can also be found in raw or undercooked meat so this should also be avoided.  Colds, Sore Throats, Flu: Please check your medication sheet to see what you can take for symptoms.  If your symptoms are unrelieved by these medications please call the office.  Dental Work: Most any dental work your dentist recommends is permitted.  X-rays should only be taken during the first trimester if absolutely necessary.  Your abdomen should be shielded with a lead apron during all x-rays.  Please notify your provider prior to receiving any x-rays.  Novocaine is fine; gas is not recommended.  If your dentist requires a note from us prior to dental work please call the office and we will provide one for you.  Exercise: Exercise is an important part of staying healthy during your pregnancy.  You may continue most exercises you were accustomed to prior to pregnancy.  Later in your pregnancy you will most likely notice you have difficulty with activities   requiring balance like riding a bicycle.  It is important that you listen to your body and avoid activities that put you at a higher risk of falling.  Adequate rest and staying well hydrated are a must!  If you have questions about the safety of specific activities ask your provider.    Exposure to Children with illness: Try to avoid obvious exposure; report any symptoms to us when noted,  If you have chicken pos, red  measles or mumps, you should be immune to these diseases.   Please do not take any vaccines while pregnant unless you have checked with your OB provider.  Fetal Movement: After 28 weeks we recommend you do "kick counts" twice daily.  Lie or sit down in a calm quiet environment and count your baby movements "kicks".  You should feel your baby at least 10 times per hour.  If you have not felt 10 kicks within the first hour get up, walk around and have something sweet to eat or drink then repeat for an additional hour.  If count remains less than 10 per hour notify your provider.  Fumigating: Follow your pest control agent's advice as to how long to stay out of your home.  Ventilate the area well before re-entering.  Hemorrhoids:   Most over-the-counter preparations can be used during pregnancy.  Check your medication to see what is safe to use.  It is important to use a stool softener or fiber in your diet and to drink lots of liquids.  If hemorrhoids seem to be getting worse please call the office.   Hot Tubs:  Hot tubs Jacuzzis and saunas are not recommended while pregnant.  These increase your internal body temperature and should be avoided.  Intercourse:  Sexual intercourse is safe during pregnancy as long as you are comfortable, unless otherwise advised by your provider.  Spotting may occur after intercourse; report any bright red bleeding that is heavier than spotting.  Labor:  If you know that you are in labor, please go to the hospital.  If you are unsure, please call the office and let us help you decide what to do.  Lifting, straining, etc:  If your job requires heavy lifting or straining please check with your provider for any limitations.  Generally, you should not lift items heavier than that you can lift simply with your hands and arms (no back muscles)  Painting:  Paint fumes do not harm your pregnancy, but may make you ill and should be avoided if possible.  Latex or water based paints  have less odor than oils.  Use adequate ventilation while painting.  Permanents & Hair Color:  Chemicals in hair dyes are not recommended as they cause increase hair dryness which can increase hair loss during pregnancy.  " Highlighting" and permanents are allowed.  Dye may be absorbed differently and permanents may not hold as well during pregnancy.  Sunbathing:  Use a sunscreen, as skin burns easily during pregnancy.  Drink plenty of fluids; avoid over heating.  Tanning Beds:  Because their possible side effects are still unknown, tanning beds are not recommended.  Ultrasound Scans:  Routine ultrasounds are performed at approximately 20 weeks.  You will be able to see your baby's general anatomy an if you would like to know the gender this can usually be determined as well.  If it is questionable when you conceived you may also receive an ultrasound early in your pregnancy for dating purposes.  Otherwise ultrasound exams   are not routinely performed unless there is a medical necessity.  Although you can request a scan we ask that you pay for it when conducted because insurance does not cover " patient request" scans.  Work: If your pregnancy proceeds without complications you may work until your due date, unless your physician or employer advises otherwise.  Round Ligament Pain/Pelvic Discomfort:  Sharp, shooting pains not associated with bleeding are fairly common, usually occurring in the second trimester of pregnancy.  They tend to be worse when standing up or when you remain standing for long periods of time.  These are the result of pressure of certain pelvic ligaments called "round ligaments".  Rest, Tylenol and heat seem to be the most effective relief.  As the womb and fetus grow, they rise out of the pelvis and the discomfort improves.  Please notify the office if your pain seems different than that described.  It may represent a more serious condition.   

## 2017-02-14 NOTE — Progress Notes (Signed)
I have reviewed the record and concur with patient management and plan.    Celestino Ackerman Michelle Makyi Ledo, CNM [facility] 

## 2017-02-14 NOTE — Progress Notes (Signed)
Tamara AhmadiKira J Mills presents for NOB nurse interview visit. Pregnancy confirmation done 01/27/2017 by JML, CNM. UPT: positive. Ultrasound results today are consistent with LMP: 12/10/2016,EDD: 09/16/2017.  G-1. P-0000. Pregnancy education material explained and given. No cats in the home. NOB labs ordered. HIV labs and Drug screen were explained optional and she did not decline. Drug screen ordered. PNV encouraged. Genetic screening options discussed. Genetic testing: Unsure. Pt to check with insurance company.  Pt may discuss with provider. Pt. To follow up with provider in  3 weeks for NOB physical.  All questions answered.

## 2017-02-15 LAB — CBC WITH DIFFERENTIAL/PLATELET
BASOS ABS: 0 10*3/uL (ref 0.0–0.2)
Basos: 0 %
EOS (ABSOLUTE): 0.1 10*3/uL (ref 0.0–0.4)
Eos: 2 %
Hematocrit: 38.8 % (ref 34.0–46.6)
Hemoglobin: 13.5 g/dL (ref 11.1–15.9)
Immature Grans (Abs): 0 10*3/uL (ref 0.0–0.1)
Immature Granulocytes: 0 %
LYMPHS ABS: 2.1 10*3/uL (ref 0.7–3.1)
Lymphs: 30 %
MCH: 30.5 pg (ref 26.6–33.0)
MCHC: 34.8 g/dL (ref 31.5–35.7)
MCV: 88 fL (ref 79–97)
MONOS ABS: 0.5 10*3/uL (ref 0.1–0.9)
Monocytes: 8 %
NEUTROS ABS: 4.2 10*3/uL (ref 1.4–7.0)
Neutrophils: 60 %
PLATELETS: 270 10*3/uL (ref 150–379)
RBC: 4.43 x10E6/uL (ref 3.77–5.28)
RDW: 13.4 % (ref 12.3–15.4)
WBC: 7 10*3/uL (ref 3.4–10.8)

## 2017-02-15 LAB — RUBELLA SCREEN: Rubella Antibodies, IGG: 3.28 index (ref >0.99)

## 2017-02-15 LAB — GC/CHLAMYDIA PROBE AMP
Chlamydia trachomatis, NAA: NEGATIVE
NEISSERIA GONORRHOEAE BY PCR: NEGATIVE

## 2017-02-15 LAB — VARICELLA ZOSTER ANTIBODY, IGG: Varicella zoster IgG: 2482 index (ref >165)

## 2017-02-15 LAB — RH TYPE: Rh Factor: POSITIVE

## 2017-02-15 LAB — ABO

## 2017-02-15 LAB — HIV ANTIBODY (ROUTINE TESTING W REFLEX): HIV Screen 4th Generation wRfx: NONREACTIVE

## 2017-02-15 LAB — HEPATITIS B SURFACE ANTIGEN: HEP B S AG: NEGATIVE

## 2017-02-15 LAB — ANTIBODY SCREEN: ANTIBODY SCREEN: NEGATIVE

## 2017-02-15 LAB — RPR: RPR Ser Ql: NONREACTIVE

## 2017-02-16 LAB — URINALYSIS, ROUTINE W REFLEX MICROSCOPIC
Bilirubin, UA: NEGATIVE
Glucose, UA: NEGATIVE
KETONES UA: NEGATIVE
Leukocytes, UA: NEGATIVE
NITRITE UA: NEGATIVE
Protein, UA: NEGATIVE
RBC, UA: NEGATIVE
SPEC GRAV UA: 1.012 (ref 1.005–1.030)
Urobilinogen, Ur: 0.2 mg/dL (ref 0.2–1.0)
pH, UA: 6 (ref 5.0–7.5)

## 2017-02-16 LAB — MONITOR DRUG PROFILE 14(MW)
Amphetamine Scrn, Ur: NEGATIVE ng/mL
BARBITURATE SCREEN URINE: NEGATIVE ng/mL
BENZODIAZEPINE SCREEN, URINE: NEGATIVE ng/mL
BUPRENORPHINE, URINE: NEGATIVE ng/mL
CANNABINOIDS UR QL SCN: NEGATIVE ng/mL
COCAINE(METAB.)SCREEN, URINE: NEGATIVE ng/mL
Creatinine(Crt), U: 51.1 mg/dL (ref 20.0–300.0)
FENTANYL, URINE: NEGATIVE pg/mL
MEPERIDINE SCREEN, URINE: NEGATIVE ng/mL
Methadone Screen, Urine: NEGATIVE ng/mL
OPIATE SCREEN URINE: NEGATIVE ng/mL
OXYCODONE+OXYMORPHONE UR QL SCN: NEGATIVE ng/mL
PROPOXYPHENE SCREEN URINE: NEGATIVE ng/mL
Ph of Urine: 5.8 (ref 4.5–8.9)
Phencyclidine Qn, Ur: NEGATIVE ng/mL
SPECIFIC GRAVITY: 1.012
TRAMADOL SCREEN, URINE: NEGATIVE ng/mL

## 2017-02-16 LAB — NICOTINE SCREEN, URINE: Cotinine Ql Scrn, Ur: NEGATIVE ng/mL

## 2017-02-17 LAB — URINE CULTURE, OB REFLEX

## 2017-02-17 LAB — CULTURE, OB URINE

## 2017-02-23 ENCOUNTER — Encounter: Payer: Self-pay | Admitting: Certified Nurse Midwife

## 2017-03-07 ENCOUNTER — Ambulatory Visit (INDEPENDENT_AMBULATORY_CARE_PROVIDER_SITE_OTHER): Payer: 59 | Admitting: Certified Nurse Midwife

## 2017-03-07 ENCOUNTER — Encounter: Payer: Self-pay | Admitting: Certified Nurse Midwife

## 2017-03-07 VITALS — BP 122/77 | HR 73 | Wt 167.4 lb

## 2017-03-07 DIAGNOSIS — Z3401 Encounter for supervision of normal first pregnancy, first trimester: Secondary | ICD-10-CM

## 2017-03-07 LAB — POCT URINALYSIS DIPSTICK
Bilirubin, UA: NEGATIVE
Glucose, UA: NEGATIVE
KETONES UA: NEGATIVE
Leukocytes, UA: NEGATIVE
Nitrite, UA: NEGATIVE
PROTEIN UA: NEGATIVE
RBC UA: NEGATIVE
UROBILINOGEN UA: 0.2 U/dL
pH, UA: 7.5 (ref 5.0–8.0)

## 2017-03-07 NOTE — Patient Instructions (Addendum)

## 2017-03-07 NOTE — Progress Notes (Signed)
NEW OB HISTORY AND PHYSICAL  SUBJECTIVE:       Tamara Mills is a 26 y.o. G1P0 female, Patient's last menstrual period was 12/10/2016 (exact date)., Estimated Date of Delivery: 09/16/17, [redacted]w[redacted]d, presents today for establishment of Prenatal Care.  Reports decreased nausea and no vomiting.   Denies difficulty breathing or respiratory distress, chest pain, abdominal pain, vaginal bleeding, dysuria, and leg pain or swelling.   Declines genetic screening.    Gynecologic History  Patient's last menstrual period was 12/10/2016 (exact date).  Contraception: none  Last Pap: 2016. Results were: normal  Obstetric History  OB History  Gravida Para Term Preterm AB Living  1            SAB TAB Ectopic Multiple Live Births               # Outcome Date GA Lbr Len/2nd Weight Sex Delivery Anes PTL Lv  1 Current               Past Medical History:  Diagnosis Date  . Acne   . Amenorrhea    Hx of  . Anxiety   . Eating disorder    ? eating disorder in past ( pt states just running too much)  . Hand eczema   . Seasonal allergies   . Underweight    Hx of    Past Surgical History:  Procedure Laterality Date  . WISDOM TOOTH EXTRACTION      Current Outpatient Prescriptions on File Prior to Visit  Medication Sig Dispense Refill  . Benzoyl Peroxide (BENZOYL PEROXIDE) 10 % LIQD Apply topically every morning.    . Omega-3 Fatty Acids (FISH OIL) 1200 MG CAPS     . prenatal vitamin w/FE, FA (PRENATAL 1 + 1) 27-1 MG TABS tablet Take 1 tablet by mouth daily at 12 noon.     No current facility-administered medications on file prior to visit.     Allergies  Allergen Reactions  . Ortho Tri-Cyclen [Norgestimate-Eth Estradiol]     nausea  . Nickel Rash    Social History   Social History  . Marital status: Married    Spouse name: N/A  . Number of children: N/A  . Years of education: N/A   Occupational History  . Pharmacist    Social History Main Topics  . Smoking status: Never  Smoker  . Smokeless tobacco: Never Used  . Alcohol use 0.0 oz/week     Comment: occasional wine--not since pregnant  . Drug use: No  . Sexual activity: Yes    Partners: Male    Birth control/ protection: OCP, None   Other Topics Concern  . Not on file   Social History Narrative  . No narrative on file    Family History  Problem Relation Age of Onset  . Crohn's disease Brother   . Ulcerative colitis Brother   . Sudden Cardiac Death Brother   . Hypertension Mother        had also preeclampsia  . Diabetes Maternal Grandmother        Type 1  . Cancer Paternal Grandmother 60       breast  . Heart failure Paternal Grandfather        heart disease  . Cancer Paternal Aunt 67       breast    The following portions of the patient's history were reviewed and updated as appropriate: allergies, current medications, past OB history, past medical history, past surgical history, past family  history, past social history, and problem list.  OBJECTIVE:  Initial Physical Exam (New OB)  GENERAL APPEARANCE: alert, well appearing, in no apparent distress  HEAD: normocephalic, atraumatic  MOUTH: mucous membranes moist, pharynx normal without lesions and dental hygiene good  THYROID: no thyromegaly or masses present  BREASTS: no masses noted, no significant tenderness, no palpable axillary nodes, no skin changes  LUNGS: clear to auscultation, no wheezes, rales or rhonchi, symmetric air entry  HEART: regular rate and rhythm, no murmurs  ABDOMEN: soft, nontender, nondistended, no abnormal masses, no epigastric pain, fundus not palpable and FHT present  EXTREMITIES: no redness or tenderness in the calves or thighs, no edema  SKIN: normal coloration and turgor, no rashes  LYMPH NODES: no adenopathy palpable  NEUROLOGIC: alert, oriented, normal speech, no focal findings or movement disorder noted  PELVIC EXAM EXTERNAL GENITALIA: normal appearing vulva with no masses, tenderness or  lesions VAGINA: no abnormal discharge or lesions CERVIX: no lesions or cervical motion tenderness UTERUS: gravid and consistent with 12 weeks ADNEXA: no masses palpable and nontender OB EXAM PELVIMETRY: appears adequate  ASSESSMENT: Normal pregnancy Declines genetic screening  PLAN: Prenatal care New OB counseling: The patient has been given an overview regarding routine prenatal care. Recommendations regarding diet, weight gain, and exercise in pregnancy were given. Prenatal testing, optional genetic testing, and ultrasound use in pregnancy were reviewed.  Benefits of Breast Feeding were discussed. The patient is encouraged to consider nursing her baby post partum. See orders   Gunnar Bulla, CNM

## 2017-03-07 NOTE — Progress Notes (Signed)
NOB PE- last pap 2016- wnl.

## 2017-03-10 ENCOUNTER — Telehealth: Payer: Self-pay | Admitting: Certified Nurse Midwife

## 2017-03-10 ENCOUNTER — Telehealth: Payer: Self-pay | Admitting: Internal Medicine

## 2017-03-10 NOTE — Telephone Encounter (Signed)
Patient has cellulitis on her leg - should she be seen or see her PCP?  Asked Amy - she confirmed patient should see PCP   Patient informed

## 2017-03-10 NOTE — Telephone Encounter (Signed)
Has had lesion on leg for about 2 weeks. Concerned about infection but just seemed to have closed lesion with surrounding tissue damage. Most consistent with some type of insect bite  After warm compresses, the area opened and she used topical antibiotics. Now much worse Swollen, intensely red and tender Clearly now infected Some risk of MRSA but probably secondary infection (and she is pregnant which limits Rx choices) Will try cephalexin 500mg  tid (#15 x 1 to [facility]) Review with PCP tomorrow (Dr Milinda Antisower)

## 2017-03-10 NOTE — Telephone Encounter (Signed)
Shapale- can you check in with her in the am?   Could she possibly come in for my 3 pm block ?

## 2017-03-11 NOTE — Telephone Encounter (Signed)
He just called in a verbal Rx, saying Dr. Milinda Antisower is the authorizing doctor, nothing else needs to be done

## 2017-03-11 NOTE — Telephone Encounter (Signed)
Please check to see if Dr Alphonsus SiasLetvak is sending in the clindamycin or If he needs me to and keep me posted

## 2017-03-11 NOTE — Telephone Encounter (Signed)
Dr. Alphonsus SiasLetvak discuss this directly with Dr. Milinda Antisower, Dr. Alphonsus SiasLetvak will check with pt to see if she wants to have spot checked by Dr. Milinda Antisower this afternoon or stick with plan of abx

## 2017-03-15 ENCOUNTER — Telehealth: Payer: Self-pay

## 2017-03-15 NOTE — Telephone Encounter (Signed)
See note from [facility] below.  This Clinical research associatewriter lm for patient to return call with an update on her condition. (okay per DPR).  I do not see where [facility] per plan in triage call.

## 2017-03-15 NOTE — Telephone Encounter (Signed)
Patient returned call.  She states that area is still present but has much improved.  She is doing well and will keep us updated if she notes any signs of decline.

## 2017-03-15 NOTE — Telephone Encounter (Signed)
Patient Name: Tamara Mills Gender: Female DOB: 14-Mar-1991 Age: [age] Y 7 M 6 D Return Phone Number: [phone] (Primary) City/State/Zip: [place] [id number] Client [facility] - Client [facility] Night Physician Milinda Antisower, Idamae SchullerMarne - MD Who Is Calling Patient / Member / Family / Caregiver Call Type Triage / Clinical Relationship To Patient Self Return Phone Number [phone] (Primary) Chief Complaint Wound Infection Reason for Call Symptomatic / Request for Health Information Initial Comment Caller states she would like a Saturday appt. Her antibiotic isn't helping her infection. She is [redacted] weeks pregnant. The spot is oozing. No Triage Reason Patient declined Nurse Assessment Nurse: Mills-Hernandez, RN, Harriett SineNancy Date/Time (Eastern Time): 03/11/2017 7:02:35 PM Confirm and document reason for call. If symptomatic, describe symptoms. ---Caller states she would like a Saturday appt. Her antibiotic isn't helping her infection. She is [redacted] weeks pregnant. She has a spot on her upper leg that she initially thought was due to a spider bite. She saw a doctor yesterday and she was started on Keflex. Select reason for no triage. ---Patient declined Guidelines Guideline Title Affirmed Question Disp. Time Lamount Cohen(Eastern Time) Disposition Final User 03/11/2017 7:07:08 PM Clinical Call Yes Mills-Hernandez, RN, Harriett SineNancy Referrals [facility] Comments User: Vincente LibertyNancy, Mills-Hernandez, RN Date/Time Lamount Cohen(Eastern Time): 03/11/2017 7:06:49 PM Caller states that she wants to be seen tomorrow and thought her doctor's office had Saturday hours. She will phone the [facility] location in the morning for an appointment.

## 2017-03-15 NOTE — Telephone Encounter (Signed)
I don't see a note from [facility]- please call and check in with her  Thanks

## 2017-03-15 NOTE — Telephone Encounter (Signed)
Thank you . Keep me posted

## 2017-04-04 ENCOUNTER — Ambulatory Visit (INDEPENDENT_AMBULATORY_CARE_PROVIDER_SITE_OTHER): Payer: 59 | Admitting: Certified Nurse Midwife

## 2017-04-04 VITALS — BP 129/74 | HR 62 | Wt 174.0 lb

## 2017-04-04 DIAGNOSIS — Z3A20 20 weeks gestation of pregnancy: Secondary | ICD-10-CM

## 2017-04-04 LAB — POCT URINALYSIS DIPSTICK
BILIRUBIN UA: NEGATIVE
Blood, UA: NEGATIVE
GLUCOSE UA: NEGATIVE
Ketones, UA: NEGATIVE
Leukocytes, UA: NEGATIVE
NITRITE UA: NEGATIVE
Protein, UA: NEGATIVE
Spec Grav, UA: 1.01 (ref 1.010–1.025)
Urobilinogen, UA: 0.2 E.U./dL
pH, UA: 7.5 (ref 5.0–8.0)

## 2017-04-04 NOTE — Progress Notes (Signed)
ROB, doing well. Reviewed weight gain and goal of 25-35 lb weight gain for her BMI. Discussed diet and exercise. Encouraged her to incorporate exercise back into her daily routine. Discussed anatomy scan an next visit. She agree. Follow up 4 wks.   Doreene Burke, CNM

## 2017-04-04 NOTE — Patient Instructions (Signed)
Round Ligament Pain The round ligament is a cord of muscle and tissue that helps to support the uterus. It can become a source of pain during pregnancy if it becomes stretched or twisted as the baby grows. The pain usually begins in the second trimester of pregnancy, and it can come and go until the baby is delivered. It is not a serious problem, and it does not cause harm to the baby. Round ligament pain is usually a short, sharp, and pinching pain, but it can also be a dull, lingering, and aching pain. The pain is felt in the lower side of the abdomen or in the groin. It usually starts deep in the groin and moves up to the outside of the hip area. Pain can occur with:  A sudden change in position.  Rolling over in bed.  Coughing or sneezing.  Physical activity.  Follow these instructions at home: Watch your condition for any changes. Take these steps to help with your pain:  When the pain starts, relax. Then try: ? Sitting down. ? Flexing your knees up to your abdomen. ? Lying on your side with one pillow under your abdomen and another pillow between your legs. ? Sitting in a warm bath for 15-20 minutes or until the pain goes away.  Take over-the-counter and prescription medicines only as told by your health care provider.  Move slowly when you sit and stand.  Avoid long walks if they cause pain.  Stop or lessen your physical activities if they cause pain.  Contact a health care provider if:  Your pain does not go away with treatment.  You feel pain in your back that you did not have before.  Your medicine is not helping. Get help right away if:  You develop a fever or chills.  You develop uterine contractions.  You develop vaginal bleeding.  You develop nausea or vomiting.  You develop diarrhea.  You have pain when you urinate. This information is not intended to replace advice given to you by your health care provider. Make sure you discuss any questions you have  with your health care provider. Document Released: 04/06/2008 Document Revised: 12/04/2015 Document Reviewed: 09/04/2014 Elsevier Interactive Patient Education  2018 Elsevier Inc. How a Baby Grows During Pregnancy Pregnancy begins when a female's sperm enters a female's egg (fertilization). This happens in one of the tubes (fallopian tubes) that connect the ovaries to the womb (uterus). The fertilized egg is called an embryo until it reaches 10 weeks. From 10 weeks until birth, it is called a fetus. The fertilized egg moves down the fallopian tube to the uterus. Then it implants into the lining of the uterus and begins to grow. The developing fetus receives oxygen and nutrients through the pregnant woman's bloodstream and the tissues that grow (placenta) to support the fetus. The placenta is the life support system for the fetus. It provides nutrition and removes waste. Learning as much as you can about your pregnancy and how your baby is developing can help you enjoy the experience. It can also make you aware of when there might be a problem and when to ask questions. How long does a typical pregnancy last? A pregnancy usually lasts 280 days, or about 40 weeks. Pregnancy is divided into three trimesters:  First trimester: 0-13 weeks.  Second trimester: 14-27 weeks.  Third trimester: 28-40 weeks.  The day when your baby is considered ready to be born (full term) is your estimated date of delivery. How does   my baby develop month by month? First month  The fertilized egg attaches to the inside of the uterus.  Some cells will form the placenta. Others will form the fetus.  The arms, legs, brain, spinal cord, lungs, and heart begin to develop.  At the end of the first month, the heart begins to beat.  Second month  The bones, inner ear, eyelids, hands, and feet form.  The genitals develop.  By the end of 8 weeks, all major organs are developing.  Third month  All of the internal  organs are forming.  Teeth develop below the gums.  Bones and muscles begin to grow. The spine can flex.  The skin is transparent.  Fingernails and toenails begin to form.  Arms and legs continue to grow longer, and hands and feet develop.  The fetus is about 3 in (7.6 cm) long.  Fourth month  The placenta is completely formed.  The external sex organs, neck, outer ear, eyebrows, eyelids, and fingernails are formed.  The fetus can hear, swallow, and move its arms and legs.  The kidneys begin to produce urine.  The skin is covered with a white waxy coating (vernix) and very fine hair (lanugo).  Fifth month  The fetus moves around more and can be felt for the first time (quickening).  The fetus starts to sleep and wake up and may begin to suck its finger.  The nails grow to the end of the fingers.  The organ in the digestive system that makes bile (gallbladder) functions and helps to digest the nutrients.  If your baby is a girl, eggs are present in her ovaries. If your baby is a boy, testicles start to move down into his scrotum.  Sixth month  The lungs are formed, but the fetus is not yet able to breathe.  The eyes open. The brain continues to develop.  Your baby has fingerprints and toe prints. Your baby's hair grows thicker.  At the end of the second trimester, the fetus is about 9 in (22.9 cm) long.  Seventh month  The fetus kicks and stretches.  The eyes are developed enough to sense changes in light.  The hands can make a grasping motion.  The fetus responds to sound.  Eighth month  All organs and body systems are fully developed and functioning.  Bones harden and taste buds develop. The fetus may hiccup.  Certain areas of the brain are still developing. The skull remains soft.  Ninth month  The fetus gains about  lb (0.23 kg) each week.  The lungs are fully developed.  Patterns of sleep develop.  The fetus's head typically moves into a  head-down position (vertex) in the uterus to prepare for birth. If the buttocks move into a vertex position instead, the baby is breech.  The fetus weighs 6-9 lbs (2.72-4.08 kg) and is 19-20 in (48.26-50.8 cm) long.  What can I do to have a healthy pregnancy and help my baby develop? Eating and Drinking  Eat a healthy diet. ? Talk with your health care provider to make sure that you are getting the nutrients that you and your baby need. ? Visit www.choosemyplate.gov to learn about creating a healthy diet.  Gain a healthy amount of weight during pregnancy as advised by your health care provider. This is usually 25-35 pounds. You may need to: ? Gain more if you were underweight before getting pregnant or if you are pregnant with more than one baby. ? Gain less   if you were overweight or obese when you got pregnant.  Medicines and Vitamins  Take prenatal vitamins as directed by your health care provider. These include vitamins such as folic acid, iron, calcium, and vitamin D. They are important for healthy development.  Take medicines only as directed by your health care provider. Read labels and ask a pharmacist or your health care provider whether over-the-counter medicines, supplements, and prescription drugs are safe to take during pregnancy.  Activities  Be physically active as advised by your health care provider. Ask your health care provider to recommend activities that are safe for you to do, such as walking or swimming.  Do not participate in strenuous or extreme sports.  Lifestyle  Do not drink alcohol.  Do not use any tobacco products, including cigarettes, chewing tobacco, or electronic cigarettes. If you need help quitting, ask your health care provider.  Do not use illegal drugs.  Safety  Avoid exposure to mercury, lead, or other heavy metals. Ask your health care provider about common sources of these heavy metals.  Avoid listeria infection during pregnancy. Follow  these precautions: ? Do not eat soft cheeses or deli meats. ? Do not eat hot dogs unless they have been warmed up to the point of steaming, such as in the microwave oven. ? Do not drink unpasteurized milk.  Avoid toxoplasmosis infection during pregnancy. Follow these precautions: ? Do not change your cat's litter box, if you have a cat. Ask someone else to do this for you. ? Wear gardening gloves while working in the yard.  General Instructions  Keep all follow-up visits as directed by your health care provider. This is important. This includes prenatal care and screening tests.  Manage any chronic health conditions. Work closely with your health care provider to keep conditions, such as diabetes, under control.  How do I know if my baby is developing well? At each prenatal visit, your health care provider will do several different tests to check on your health and keep track of your baby's development. These include:  Fundal height. ? Your health care provider will measure your growing belly from top to bottom using a tape measure. ? Your health care provider will also feel your belly to determine your baby's position.  Heartbeat. ? An ultrasound in the first trimester can confirm pregnancy and show a heartbeat, depending on how far along you are. ? Your health care provider will check your baby's heart rate at every prenatal visit. ? As you get closer to your delivery date, you may have regular fetal heart rate monitoring to make sure that your baby is not in distress.  Second trimester ultrasound. ? This ultrasound checks your baby's development. It also indicates your baby's gender.  What should I do if I have concerns about my baby's development? Always talk with your health care provider about any concerns that you may have. This information is not intended to replace advice given to you by your health care provider. Make sure you discuss any questions you have with your health  care provider. Document Released: 12/15/2007 Document Revised: 12/04/2015 Document Reviewed: 12/05/2013 Elsevier Interactive Patient Education  2018 Elsevier Inc.  

## 2017-04-29 ENCOUNTER — Ambulatory Visit (INDEPENDENT_AMBULATORY_CARE_PROVIDER_SITE_OTHER): Payer: 59

## 2017-04-29 ENCOUNTER — Ambulatory Visit (INDEPENDENT_AMBULATORY_CARE_PROVIDER_SITE_OTHER): Payer: 59 | Admitting: Obstetrics and Gynecology

## 2017-04-29 VITALS — BP 128/82 | HR 79 | Wt 177.5 lb

## 2017-04-29 DIAGNOSIS — Z3A20 20 weeks gestation of pregnancy: Secondary | ICD-10-CM

## 2017-04-29 DIAGNOSIS — Z3492 Encounter for supervision of normal pregnancy, unspecified, second trimester: Secondary | ICD-10-CM | POA: Diagnosis not present

## 2017-04-29 LAB — POCT URINALYSIS DIPSTICK
Bilirubin, UA: NEGATIVE
Blood, UA: NEGATIVE
Glucose, UA: NEGATIVE
KETONES UA: NEGATIVE
LEUKOCYTES UA: NEGATIVE
Nitrite, UA: NEGATIVE
PH UA: 6.5 (ref 5.0–8.0)
PROTEIN UA: NEGATIVE
Spec Grav, UA: 1.01 (ref 1.010–1.025)
UROBILINOGEN UA: 0.2 U/dL

## 2017-04-29 NOTE — Progress Notes (Signed)
ROB and anatomy scan: Location: [facility] Date of Service:  04/29/17  Indications:  Anatomy Findings:  Singleton intrauterine pregnancy is visualized with FHR at 144 BPM. Biometrics give an (U/S) Gestational age of 27 3/7 weeks and an (U/S) EDD of 09/13/17; this correlates with the clinically established EDD of 09/16/17.  Fetal presentation is footling breech.  EFW: 361 grams (0lb 13oz). Placenta: Posterior and grade 0.  Placenta is 4.6cm from cervical os with a marginal cord insertion on fundal end away from cervix. AFI: Appears WNL subjectively.  Anatomic survey is complete and normal; Gender - Female.   Right Ovary measures 4.1 x 2.7 x 3.2 cm and appears WNL.  Left Ovary measures 3.2 x 1.7 x 2.2 cm and appears WNL.  There is no obvious evidence of a corpus luteal cyst. Survey of the adnexa demonstrates no adnexal masses. There is no free peritoneal fluid in the cul de sac.  Impression: 1. 20 3/7 week Viable Singleton Intrauterine pregnancy by U/S. 2. (U/S) EDD is consistent with Clinically established (LMP) EDD of 09/16/17. 3. Normal Anatomy Scan

## 2017-04-29 NOTE — Progress Notes (Signed)
ROB- pt had anatomy scan done today,pts BP slightly elevated, thinks b/c shes happy about having a girl, rechecked bp 128 82

## 2017-05-27 ENCOUNTER — Ambulatory Visit (INDEPENDENT_AMBULATORY_CARE_PROVIDER_SITE_OTHER): Payer: 59 | Admitting: Certified Nurse Midwife

## 2017-05-27 VITALS — BP 128/72 | HR 71 | Wt 180.6 lb

## 2017-05-27 DIAGNOSIS — Z130 Encounter for screening for diseases of the blood and blood-forming organs and certain disorders involving the immune mechanism: Secondary | ICD-10-CM

## 2017-05-27 DIAGNOSIS — Z3492 Encounter for supervision of normal pregnancy, unspecified, second trimester: Secondary | ICD-10-CM

## 2017-05-27 DIAGNOSIS — Z131 Encounter for screening for diabetes mellitus: Secondary | ICD-10-CM

## 2017-05-27 LAB — POCT URINALYSIS DIPSTICK
Bilirubin, UA: NEGATIVE
Blood, UA: NEGATIVE
GLUCOSE UA: NEGATIVE
KETONES UA: NEGATIVE
LEUKOCYTES UA: NEGATIVE
Nitrite, UA: NEGATIVE
PROTEIN UA: NEGATIVE
SPEC GRAV UA: 1.015 (ref 1.010–1.025)
Urobilinogen, UA: 0.2 E.U./dL
pH, UA: 6.5 (ref 5.0–8.0)

## 2017-05-27 NOTE — Progress Notes (Signed)
ROB-Pt doing well, reports intermittent pain with coughing and movement. Discussed round ligament pain in pregnancy and home treatment measures including abdominal support. Anticipatory guidance regarding 27 visit including vaccines and screening. Have picked name, "Tamara Mills". Reviewed red flag symptoms and when to call. Reviewed red flag symptoms and when to call. RTC x 4 weeks for glucola, CBC, TDaP and ROB with Pattricia Bossnnie or sooner if needed.

## 2017-05-27 NOTE — Progress Notes (Signed)
ROB- pt is doing well 

## 2017-05-27 NOTE — Patient Instructions (Addendum)
Common Medications Safe in Pregnancy  Acne:      Constipation:  Benzoyl Peroxide     Colace  Clindamycin      Dulcolax Suppository  Topica Erythromycin     Fibercon  Salicylic Acid      Metamucil         Miralax AVOID:        Senakot   Accutane    Cough:  Retin-A       Cough Drops  Tetracycline      Phenergan w/ Codeine if Rx  Minocycline      Robitussin (Plain & DM)  Antibiotics:     Crabs/Lice:  Ceclor       RID  Cephalosporins    AVOID:  E-Mycins      Kwell  Keflex  Macrobid/Macrodantin   Diarrhea:  Penicillin      Kao-Pectate  Zithromax      Imodium AD         PUSH FLUIDS AVOID:       Cipro     Fever:  Tetracycline      Tylenol (Regular or Extra  Minocycline       Strength)  Levaquin      Extra Strength-Do not          Exceed 8 tabs/24 hrs Caffeine:        <200mg/day (equiv. To 1 cup of coffee or  approx. 3 12 oz sodas)         Gas: Cold/Hayfever:       Gas-X  Benadryl      Mylicon  Claritin       Phazyme  **Claritin-D        Chlor-Trimeton    Headaches:  Dimetapp      ASA-Free Excedrin  Drixoral-Non-Drowsy     Cold Compress  Mucinex (Guaifenasin)     Tylenol (Regular or Extra  Sudafed/Sudafed-12 Hour     Strength)  **Sudafed PE Pseudoephedrine   Tylenol Cold & Sinus     Vicks Vapor Rub  Zyrtec  **AVOID if Problems With Blood Pressure         Heartburn: Avoid lying down for at least 1 hour after meals  Aciphex      Maalox     Rash:  Milk of Magnesia     Benadryl    Mylanta       1% Hydrocortisone Cream  Pepcid  Pepcid Complete   Sleep Aids:  Prevacid      Ambien   Prilosec       Benadryl  Rolaids       Chamomile Tea  Tums (Limit 4/day)     Unisom  Zantac       Tylenol PM         Warm milk-add vanilla or  Hemorrhoids:       Sugar for taste  Anusol/Anusol H.C.  (RX: Analapram 2.5%)  Sugar Substitutes:  Hydrocortisone OTC     Ok in moderation  Preparation H      Tucks        Vaseline lotion applied to tissue with  wiping    Herpes:     Throat:  Acyclovir      Oragel  Famvir  Valtrex     Vaccines:         Flu Shot Leg Cramps:       *Gardasil  Benadryl      Hepatitis A         Hepatitis B Nasal Spray:         Pneumovax  Saline Nasal Spray     Polio Booster         Tetanus Nausea:       Tuberculosis test or PPD  Vitamin B6 25 mg TID   AVOID:    Dramamine      *Gardasil  Emetrol       Live Poliovirus  Ginger Root 250 mg QID    MMR (measles, mumps &  High Complex Carbs @ Bedtime    rebella)  Sea Bands-Accupressure    Varicella (Chickenpox)  Unisom 1/2 tab TID     *No known complications           If received before Pain:         Known pregnancy;   Darvocet       Resume series after  Lortab        Delivery  Percocet    Yeast:   Tramadol      Femstat  Tylenol 3      Gyne-lotrimin  Ultram       Monistat  Vicodin           MISC:         All Sunscreens           Hair Coloring/highlights          Insect Repellant's          (Including DEET)         Mystic Tans Back Pain in Pregnancy Back pain during pregnancy is common. Back pain may be caused by several factors that are related to changes during your pregnancy. Follow these instructions at home: Managing pain, stiffness, and swelling  If directed, apply ice for sudden (acute) back pain. ? Put ice in a plastic bag. ? Place a towel between your skin and the bag. ? Leave the ice on for 20 minutes, 2-3 times per day.  If directed, apply heat to the affected area before you exercise: ? Place a towel between your skin and the heat pack or heating pad. ? Leave the heat on for 20-30 minutes. ? Remove the heat if your skin turns bright red. This is especially important if you are unable to feel pain, heat, or cold. You may have a greater risk of getting burned. Activity  Exercise as told by your health care provider. Exercising is the best way to prevent or manage back pain.  Listen to your body when lifting. If lifting hurts, ask for help or  bend your knees. This uses your leg muscles instead of your back muscles.  Squat down when picking up something from the floor. Do not bend over.  Only use bed rest as told by your health care provider. Bed rest should only be used for the most severe episodes of back pain. Standing, Sitting, and Lying Down  Do not stand in one place for long periods of time.  Use good posture when sitting. Make sure your head rests over your shoulders and is not hanging forward. Use a pillow on your lower back if necessary.  Try sleeping on your side, preferably the left side, with a pillow or two between your legs. If you are sore after a night's rest, your bed may be too soft. A firm mattress may provide more support for your back during pregnancy. General instructions  Do not wear high heels.  Eat a healthy diet. Try to gain weight within your health care provider's recommendations.  Use a maternity girdle, elastic sling, or   back brace as told by your health care provider.  Take over-the-counter and prescription medicines only as told by your health care provider.  Keep all follow-up visits as told by your health care provider. This is important. This includes any visits with any specialists, such as a physical therapist. Contact a health care provider if:  Your back pain interferes with your daily activities.  You have increasing pain in other parts of your body. Get help right away if:  You develop numbness, tingling, weakness, or problems with the use of your arms or legs.  You develop severe back pain that is not controlled with medicine.  You have a sudden change in bowel or bladder control.  You develop shortness of breath, dizziness, or you faint.  You develop nausea, vomiting, or sweating.  You have back pain that is a rhythmic, cramping pain similar to labor pains. Labor pain is usually 1-2 minutes apart, lasts for about 1 minute, and involves a bearing down feeling or pressure in  your pelvis.  You have back pain and your water breaks or you have vaginal bleeding.  You have back pain or numbness that travels down your leg.  Your back pain developed after you fell.  You develop pain on one side of your back.  You see blood in your urine.  You develop skin blisters in the area of your back pain. This information is not intended to replace advice given to you by your health care provider. Make sure you discuss any questions you have with your health care provider. Document Released: 10/06/2005 Document Revised: 12/04/2015 Document Reviewed: 03/12/2015 Elsevier Interactive Patient Education  2018 [employer]. Round Ligament Pain The round ligament is a cord of muscle and tissue that helps to support the uterus. It can become a source of pain during pregnancy if it becomes stretched or twisted as the baby grows. The pain usually begins in the second trimester of pregnancy, and it can come and go until the baby is delivered. It is not a serious problem, and it does not cause harm to the baby. Round ligament pain is usually a short, sharp, and pinching pain, but it can also be a dull, lingering, and aching pain. The pain is felt in the lower side of the abdomen or in the groin. It usually starts deep in the groin and moves up to the outside of the hip area. Pain can occur with:  A sudden change in position.  Rolling over in bed.  Coughing or sneezing.  Physical activity.  Follow these instructions at home: Watch your condition for any changes. Take these steps to help with your pain:  When the pain starts, relax. Then try: ? Sitting down. ? Flexing your knees up to your abdomen. ? Lying on your side with one pillow under your abdomen and another pillow between your legs. ? Sitting in a warm bath for 15-20 minutes or until the pain goes away.  Take over-the-counter and prescription medicines only as told by your health care provider.  Move slowly when you sit  and stand.  Avoid long walks if they cause pain.  Stop or lessen your physical activities if they cause pain.  Contact a health care provider if:  Your pain does not go away with treatment.  You feel pain in your back that you did not have before.  Your medicine is not helping. Get help right away if:  You develop a fever or chills.  You develop uterine contractions.  You develop vaginal bleeding.  You develop nausea or vomiting.  You develop diarrhea.  You have pain when you urinate. This information is not intended to replace advice given to you by your health care provider. Make sure you discuss any questions you have with your health care provider. Document Released: 04/06/2008 Document Revised: 12/04/2015 Document Reviewed: 09/04/2014 Elsevier Interactive Patient Education  [employer].

## 2017-06-24 ENCOUNTER — Encounter: Payer: Self-pay | Admitting: Certified Nurse Midwife

## 2017-06-24 ENCOUNTER — Ambulatory Visit (INDEPENDENT_AMBULATORY_CARE_PROVIDER_SITE_OTHER): Payer: 59 | Admitting: Certified Nurse Midwife

## 2017-06-24 ENCOUNTER — Other Ambulatory Visit: Payer: 59

## 2017-06-24 VITALS — BP 132/79 | HR 69 | Wt 181.4 lb

## 2017-06-24 DIAGNOSIS — Z131 Encounter for screening for diabetes mellitus: Secondary | ICD-10-CM

## 2017-06-24 DIAGNOSIS — Z23 Encounter for immunization: Secondary | ICD-10-CM | POA: Diagnosis not present

## 2017-06-24 DIAGNOSIS — Z130 Encounter for screening for diseases of the blood and blood-forming organs and certain disorders involving the immune mechanism: Secondary | ICD-10-CM

## 2017-06-24 DIAGNOSIS — Z3492 Encounter for supervision of normal pregnancy, unspecified, second trimester: Secondary | ICD-10-CM

## 2017-06-24 LAB — POCT URINALYSIS DIPSTICK
Bilirubin, UA: NEGATIVE
Glucose, UA: NEGATIVE
Ketones, UA: NEGATIVE
LEUKOCYTES UA: NEGATIVE
NITRITE UA: NEGATIVE
Odor: NEGATIVE
PROTEIN UA: NEGATIVE
RBC UA: NEGATIVE
SPEC GRAV UA: 1.01 (ref 1.010–1.025)
UROBILINOGEN UA: 0.2 U/dL
pH, UA: 7.5 (ref 5.0–8.0)

## 2017-06-24 NOTE — Patient Instructions (Signed)
Cord Blood Banking Information  Cord blood banking is the process of collecting and storing the blood that is in the umbilical cord and placenta at the time of delivery. This blood contains stem cells, which can be used to treat many blood diseases, immune system disorders, and childhood cancers. Stem cells can also be used to research certain diseases and treatments.  Many people who choose cord blood banking donate the blood. Donated blood can be used in lifesaving treatments or for research. Other people choose to store the blood privately. Blood that is stored privately can only be used with the person's permission. This option is often chosen if:  · A family member needs a stem cell transplant.  · The child is part of an ethnic minority.  · The child was conceived through in vitro fertilization.    What should I look for in a blood bank?  A blood bank is the organization that coordinates cord blood banking. Make sure the cord blood bank that you use:  · Is accredited.  · Is financially stable.  · Handles a large volume of cord blood samples.  · Has a procedure in place for transport and storage.  · Allows you the option of transferring your cord blood sample.  · Has a procedure in place if the bank goes out of business.  · Clearly states all costs and limits to future costs.    People who choose to donate cord blood should not need to pay for blood banking. People who keep the blood for private use will need to pay for the first (initial) storage and pay a fee each year (annual fee). Other fees may also apply.  What are the risks of cord blood banking?  There are no health risks associated with cord blood banking. It is considered safe.  How should I prepare?  You must schedule this process at least 4-6 weeks before you will be giving birth.  How is the blood collected?  The blood is collected as soon as the baby has been delivered. Within 15 minutes of delivery, a health care provider will take these actions  to collect the blood:  · Clamp the umbilical cord at the top and bottom. This traps the blood in the umbilical cord.  · Use a syringe or bag to collect the blood.  · Insert needles into the placenta to collect (draw out) more blood.    What happens after the blood is collected?  After the blood has been collected:  · The blood will be sent to a blood bank.  · The blood will be tested for genetic problems and infectious diseases. If the blood tests positive for a genetic problem or a disease, someone will contact you and let you know.  · The blood will be frozen.    If your child develops a genetic condition, immune system disorder, or cancer, you will be responsible for contacting the blood bank and letting them know.  This information is not intended to replace advice given to you by your health care provider. Make sure you discuss any questions you have with your health care provider.  Document Released: 12/16/2009 Document Revised: 12/04/2015 Document Reviewed: 12/16/2014  Elsevier Interactive Patient Education © 2018 Else[employer]

## 2017-06-24 NOTE — Progress Notes (Signed)
ROB- Gtt,btc, and tdap.

## 2017-06-24 NOTE — Progress Notes (Signed)
ROB, doing well. GTT/BTC/TDAP/ RPR today. Will follow up with results. Reviewed. Cord blood donation ( public & private) and discussed B/C after delivery. Pamphlet given. Follow up 2 wks.   Doreene BurkeAnnie Deztinee Lohmeyer, CNM

## 2017-06-25 ENCOUNTER — Encounter: Payer: Self-pay | Admitting: Certified Nurse Midwife

## 2017-06-25 LAB — GLUCOSE, 1 HOUR GESTATIONAL: Gestational Diabetes Screen: 89 mg/dL (ref 65–139)

## 2017-06-25 LAB — CBC
HEMATOCRIT: 41.1 % (ref 34.0–46.6)
Hemoglobin: 13.6 g/dL (ref 11.1–15.9)
MCH: 29.5 pg (ref 26.6–33.0)
MCHC: 33.1 g/dL (ref 31.5–35.7)
MCV: 89 fL (ref 79–97)
PLATELETS: 241 10*3/uL (ref 150–379)
RBC: 4.61 x10E6/uL (ref 3.77–5.28)
RDW: 14.6 % (ref 12.3–15.4)
WBC: 7.4 10*3/uL (ref 3.4–10.8)

## 2017-06-25 LAB — RPR: RPR Ser Ql: NONREACTIVE

## 2017-07-11 ENCOUNTER — Encounter: Payer: 59 | Admitting: Certified Nurse Midwife

## 2017-07-11 ENCOUNTER — Ambulatory Visit (INDEPENDENT_AMBULATORY_CARE_PROVIDER_SITE_OTHER): Payer: 59 | Admitting: Certified Nurse Midwife

## 2017-07-11 VITALS — BP 122/70 | HR 62 | Wt 186.3 lb

## 2017-07-11 DIAGNOSIS — Z3493 Encounter for supervision of normal pregnancy, unspecified, third trimester: Secondary | ICD-10-CM

## 2017-07-11 LAB — POCT URINALYSIS DIPSTICK
BILIRUBIN UA: NEGATIVE
GLUCOSE UA: NEGATIVE
KETONES UA: NEGATIVE
Leukocytes, UA: NEGATIVE
Nitrite, UA: NEGATIVE
Protein, UA: NEGATIVE
RBC UA: NEGATIVE
Spec Grav, UA: 1.01 (ref 1.010–1.025)
UROBILINOGEN UA: 0.2 U/dL
pH, UA: 6.5 (ref 5.0–8.0)

## 2017-07-11 NOTE — Progress Notes (Signed)
ROB, doing well. Discussed birth control after delivery. She has not decieded on a method at this time. Discussed birth plan for labor and encouraged her to sign up for classes if desired. 2019 class schedule given.   Doreene BurkeAnnie Mary Hockey, CNM

## 2017-07-11 NOTE — Patient Instructions (Signed)

## 2017-07-11 NOTE — Progress Notes (Signed)
ROB- pt is doing well 

## 2017-07-12 NOTE — L&D Delivery Note (Addendum)
0035 In room to see patient, reports intermittent pelvic and rectal pressure with the urge to push. SVE: 10/100/+1, vertex. Maternal pushing efforts attempted in various positions and pitocin titration initiated to increase frequency of contractions. Effective maternal pushing efforts noted in hands and knees then patient return to recumbent position for open glottis pushing with visual descent of fetal head.    Spontaneous vaginal birth of liveborn female infant at 0221 in occiput anterior position with compound left arm presesntation. Infant immediately to maternal abdomen. Delayed cord clamping, three (3) vessel cord, and cord blood collected. APGARS: 8,9. Weight pending. Receiving nurse present at bedside for birth.    Pitocin infusing. Spontaneous delivery of intact placenta at 0230. Red robin placed with sterile technique. Bilateral periurethral lacerations repaired with 3-0 Monocryl SH with adequate epidural anesthesia; well approximated and hemostatic. First degree perineal laceration hemostatic, unrepaired. Uterus firm. Lochia: scant. QBL: 300 ml. Vault check completed. Counts correct x 2.   Initiate routine postpartum care and orders. Mom to postpartum.  Baby to Couplet care / Skin to Skin.  FOB present at bedside and overjoyed with the birth of "Collier Flowersmma Jane". Doula present for continuous labor support.    Gunnar BullaJenkins Michelle Lawhorn, CNM [facility] 09/10/2017, 2:56 AM

## 2017-07-26 ENCOUNTER — Ambulatory Visit (INDEPENDENT_AMBULATORY_CARE_PROVIDER_SITE_OTHER): Payer: Managed Care, Other (non HMO) | Admitting: Certified Nurse Midwife

## 2017-07-26 ENCOUNTER — Encounter: Payer: 59 | Admitting: Certified Nurse Midwife

## 2017-07-26 VITALS — BP 130/75 | HR 67 | Wt 186.5 lb

## 2017-07-26 DIAGNOSIS — Z3493 Encounter for supervision of normal pregnancy, unspecified, third trimester: Secondary | ICD-10-CM

## 2017-07-26 LAB — POCT URINALYSIS DIPSTICK
BILIRUBIN UA: NEGATIVE
GLUCOSE UA: NEGATIVE
KETONES UA: NEGATIVE
Leukocytes, UA: NEGATIVE
Nitrite, UA: NEGATIVE
PH UA: 6 (ref 5.0–8.0)
Protein, UA: NEGATIVE
RBC UA: NEGATIVE
Spec Grav, UA: 1.01 (ref 1.010–1.025)
UROBILINOGEN UA: 0.2 U/dL

## 2017-07-26 NOTE — Progress Notes (Signed)
ROB-Doing well, no questions or concerns. Plans spouse as labor support. [facility] volunteer doula brochure and sample packing list given. Reviewed red flag symptoms and when to call. RTC x 2 weeks for ROB with Pattricia BossAnnie or sooner if needed.

## 2017-07-26 NOTE — Patient Instructions (Addendum)
Vaginal Delivery °Vaginal delivery means that you will give birth by pushing your baby out of your birth canal (vagina). A team of health care providers will help you before, during, and after vaginal delivery. Birth experiences are unique for every woman and every pregnancy, and birth experiences vary depending on where you choose to give birth. °What should I do to prepare for my baby's birth? °Before your baby is born, it is important to talk with your health care provider about: °· Your labor and delivery preferences. These may include: °? Medicines that you may be given. °? How you will manage your pain. This might include non-medical pain relief techniques or injectable pain relief such as epidural analgesia. °? How you and your baby will be monitored during labor and delivery. °? Who may be in the labor and delivery room with you. °? Your feelings about surgical delivery of your baby (cesarean delivery, or C-section) if this becomes necessary. °? Your feelings about receiving donated blood through an IV tube (blood transfusion) if this becomes necessary. °· Whether you are able: °? To take pictures or videos of the birth. °? To eat during labor and delivery. °? To move around, walk, or change positions during labor and delivery. °· What to expect after your baby is born, such as: °? Whether delayed umbilical cord clamping and cutting is offered. °? Who will care for your baby right after birth. °? Medicines or tests that may be recommended for your baby. °? Whether breastfeeding is supported in your hospital or birth center. °? How long you will be in the hospital or birth center. °· How any medical conditions you have may affect your baby or your labor and delivery experience. ° °To prepare for your baby's birth, you should also: °· Attend all of your health care visits before delivery (prenatal visits) as recommended by your health care provider. This is important. °· Prepare your home for your baby's  arrival. Make sure that you have: °? Diapers. °? Baby clothing. °? Feeding equipment. °? Safe sleeping arrangements for you and your baby. °· Install a car seat in your vehicle. Have your car seat checked by a certified car seat installer to make sure that it is installed safely. °· Think about who will help you with your new baby at home for at least the first several weeks after delivery. ° °What can I expect when I arrive at the birth center or hospital? °Once you are in labor and have been admitted into the hospital or birth center, your health care provider may: °· Review your pregnancy history and any concerns you have. °· Insert an IV tube into one of your veins. This is used to give you fluids and medicines. °· Check your blood pressure, pulse, temperature, and heart rate (vital signs). °· Check whether your bag of water (amniotic sac) has broken (ruptured). °· Talk with you about your birth plan and discuss pain control options. ° °Monitoring °Your health care provider may monitor your contractions (uterine monitoring) and your baby's heart rate (fetal monitoring). You may need to be monitored: °· Often, but not continuously (intermittently). °· All the time or for long periods at a time (continuously). Continuous monitoring may be needed if: °? You are taking certain medicines, such as medicine to relieve pain or make your contractions stronger. °? You have pregnancy or labor complications. ° °Monitoring may be done by: °· Placing a special stethoscope or a handheld monitoring device on your abdomen to   check your baby's heartbeat, and feeling your abdomen for contractions. This method of monitoring does not continuously record your baby's heartbeat or your contractions. °· Placing monitors on your abdomen (external monitors) to record your baby's heartbeat and the frequency and length of contractions. You may not have to wear external monitors all the time. °· Placing monitors inside of your uterus  (internal monitors) to record your baby's heartbeat and the frequency, length, and strength of your contractions. °? Your health care provider may use internal monitors if he or she needs more information about the strength of your contractions or your baby's heart rate. °? Internal monitors are put in place by passing a thin, flexible wire through your vagina and into your uterus. Depending on the type of monitor, it may remain in your uterus or on your baby's head until birth. °? Your health care provider will discuss the benefits and risks of internal monitoring with you and will ask for your permission before inserting the monitors. °· Telemetry. This is a type of continuous monitoring that can be done with external or internal monitors. Instead of having to stay in bed, you are able to move around during telemetry. Ask your health care provider if telemetry is an option for you. ° °Physical exam °Your health care provider may perform a physical exam. This may include: °· Checking whether your baby is positioned: °? With the head toward your vagina (head-down). This is most common. °? With the head toward the top of your uterus (head-up or breech). If your baby is in a breech position, your health care provider may try to turn your baby to a head-down position so you can deliver vaginally. If it does not seem that your baby can be born vaginally, your provider may recommend surgery to deliver your baby. In rare cases, you may be able to deliver vaginally if your baby is head-up (breech delivery). °? Lying sideways (transverse). Babies that are lying sideways cannot be delivered vaginally. °· Checking your cervix to determine: °? Whether it is thinning out (effacing). °? Whether it is opening up (dilating). °? How low your baby has moved into your birth canal. ° °What are the three stages of labor and delivery? ° °Normal labor and delivery is divided into the following three stages: °Stage 1 °· Stage 1 is the  longest stage of labor, and it can last for hours or days. Stage 1 includes: °? Early labor. This is when contractions may be irregular, or regular and mild. Generally, early labor contractions are more than 10 minutes apart. °? Active labor. This is when contractions get longer, more regular, more frequent, and more intense. °? The transition phase. This is when contractions happen very close together, are very intense, and may last longer than during any other part of labor. °· Contractions generally feel mild, infrequent, and irregular at first. They get stronger, more frequent (about every 2-3 minutes), and more regular as you progress from early labor through active labor and transition. °· Many women progress through stage 1 naturally, but you may need help to continue making progress. If this happens, your health care provider may talk with you about: °? Rupturing your amniotic sac if it has not ruptured yet. °? Giving you medicine to help make your contractions stronger and more frequent. °· Stage 1 ends when your cervix is completely dilated to 4 inches (10 cm) and completely effaced. This happens at the end of the transition phase. °Stage 2 °· Once   your cervix is completely effaced and dilated to 4 inches (10 cm), you may start to feel an urge to push. It is common for the body to naturally take a rest before feeling the urge to push, especially if you received an epidural or certain other pain medicines. This rest period may last for up to 1-2 hours, depending on your unique labor experience. °· During stage 2, contractions are generally less painful, because pushing helps relieve contraction pain. Instead of contraction pain, you may feel stretching and burning pain, especially when the widest part of your baby's head passes through the vaginal opening (crowning). °· Your health care provider will closely monitor your pushing progress and your baby's progress through the vagina during stage 2. °· Your  health care provider may massage the area of skin between your vaginal opening and anus (perineum) or apply warm compresses to your perineum. This helps it stretch as the baby's head starts to crown, which can help prevent perineal tearing. °? In some cases, an incision may be made in your perineum (episiotomy) to allow the baby to pass through the vaginal opening. An episiotomy helps to make the opening of the vagina larger to allow more room for the baby to fit through. °· It is very important to breathe and focus so your health care provider can control the delivery of your baby's head. Your health care provider may have you decrease the intensity of your pushing, to help prevent perineal tearing. °· After delivery of your baby's head, the shoulders and the rest of the body generally deliver very quickly and without difficulty. °· Once your baby is delivered, the umbilical cord may be cut right away, or this may be delayed for 1-2 minutes, depending on your baby's health. This may vary among health care providers, hospitals, and birth centers. °· If you and your baby are healthy enough, your baby may be placed on your chest or abdomen to help maintain the baby's temperature and to help you bond with each other. Some mothers and babies start breastfeeding at this time. Your health care team will dry your baby and help keep your baby warm during this time. °· Your baby may need immediate care if he or she: °? Showed signs of distress during labor. °? Has a medical condition. °? Was born too early (prematurely). °? Had a bowel movement before birth (meconium). °? Shows signs of difficulty transitioning from being inside the uterus to being outside of the uterus. °If you are planning to breastfeed, your health care team will help you begin a feeding. °Stage 3 °· The third stage of labor starts immediately after the birth of your baby and ends after you deliver the placenta. The placenta is an organ that develops  during pregnancy to provide oxygen and nutrients to your baby in the womb. °· Delivering the placenta may require some pushing, and you may have mild contractions. Breastfeeding can stimulate contractions to help you deliver the placenta. °· After the placenta is delivered, your uterus should tighten (contract) and become firm. This helps to stop bleeding in your uterus. To help your uterus contract and to control bleeding, your health care provider may: °? Give you medicine by injection, through an IV tube, by mouth, or through your rectum (rectally). °? Massage your abdomen or perform a vaginal exam to remove any blood clots that are left in your uterus. °? Empty your bladder by placing a thin, flexible tube (catheter) into your bladder. °? Encourage   you to breastfeed your baby. °After labor is over, you and your baby will be monitored closely to ensure that you are both healthy until you are ready to go home. Your health care team will teach you how to care for yourself and your baby. °This information is not intended to replace advice given to you by your health care provider. Make sure you discuss any questions you have with your health care provider. °Document Released: 04/06/2008 Document Revised: 01/16/2016 Document Reviewed: 07/13/2015 °[employer] Interactive Patient Education © 2018 [employer] °Fetal Movement Counts °Patient Name: ________________________________________________ Patient Due Date: ____________________ °What is a fetal movement count? °A fetal movement count is the number of times that you feel your baby move during a certain amount of time. This may also be called a fetal kick count. A fetal movement count is recommended for every pregnant woman. You may be asked to start counting fetal movements as early as week 28 of your pregnancy. °Pay attention to when your baby is most active. You may notice your baby's sleep and wake cycles. You may also notice things that make your baby move more.  You should do a fetal movement count: °· When your baby is normally most active. °· At the same time each day. ° °A good time to count movements is while you are resting, after having something to eat and drink. °How do I count fetal movements? °1. Find a quiet, comfortable area. Sit, or lie down on your side. °2. Write down the date, the start time and stop time, and the number of movements that you felt between those two times. Take this information with you to your health care visits. °3. For 2 hours, count kicks, flutters, swishes, rolls, and jabs. You should feel at least 10 movements during 2 hours. °4. You may stop counting after you have felt 10 movements. °5. If you do not feel 10 movements in 2 hours, have something to eat and drink. Then, keep resting and counting for 1 hour. If you feel at least 4 movements during that hour, you may stop counting. °Contact a health care provider if: °· You feel fewer than 4 movements in 2 hours. °· Your baby is not moving like he or she usually does. °Date: ____________ Start time: ____________ Stop time: ____________ Movements: ____________ °Date: ____________ Start time: ____________ Stop time: ____________ Movements: ____________ °Date: ____________ Start time: ____________ Stop time: ____________ Movements: ____________ °Date: ____________ Start time: ____________ Stop time: ____________ Movements: ____________ °Date: ____________ Start time: ____________ Stop time: ____________ Movements: ____________ °Date: ____________ Start time: ____________ Stop time: ____________ Movements: ____________ °Date: ____________ Start time: ____________ Stop time: ____________ Movements: ____________ °Date: ____________ Start time: ____________ Stop time: ____________ Movements: ____________ °Date: ____________ Start time: ____________ Stop time: ____________ Movements: ____________ °This information is not intended to replace advice given to you by your health care provider. Make  sure you discuss any questions you have with your health care provider. °Document Released: 07/28/2006 Document Revised: 02/25/2016 Document Reviewed: 08/07/2015 °[employer] Interactive Patient Education © 2018 [employer] °Braxton Hicks Contractions °Contractions of the uterus can occur throughout pregnancy, but they are not always a sign that you are in labor. You may have practice contractions called Braxton Hicks contractions. These false labor contractions are sometimes confused with true labor. °What are Braxton Hicks contractions? °Braxton Hicks contractions are tightening movements that occur in the muscles of the uterus before labor. Unlike true labor contractions, these contractions do not result in opening (dilation) and   thinning of the cervix. Toward the end of pregnancy (32-34 weeks), Braxton Hicks contractions can happen more often and may become stronger. These contractions are sometimes difficult to tell apart from true labor because they can be very uncomfortable. You should not feel embarrassed if you go to the hospital with false labor. °Sometimes, the only way to tell if you are in true labor is for your health care provider to look for changes in the cervix. The health care provider will do a physical exam and may monitor your contractions. If you are not in true labor, the exam should show that your cervix is not dilating and your water has not broken. °If there are other health problems associated with your pregnancy, it is completely safe for you to be sent home with false labor. You may continue to have Braxton Hicks contractions until you go into true labor. °How to tell the difference between true labor and false labor °True labor °· Contractions last 30-70 seconds. °· Contractions become very regular. °· Discomfort is usually felt in the top of the uterus, and it spreads to the lower abdomen and low back. °· Contractions do not go away with walking. °· Contractions usually become more  intense and increase in frequency. °· The cervix dilates and gets thinner. °False labor °· Contractions are usually shorter and not as strong as true labor contractions. °· Contractions are usually irregular. °· Contractions are often felt in the front of the lower abdomen and in the groin. °· Contractions may go away when you walk around or change positions while lying down. °· Contractions get weaker and are shorter-lasting as time goes on. °· The cervix usually does not dilate or become thin. °Follow these instructions at home: °· Take over-the-counter and prescription medicines only as told by your health care provider. °· Keep up with your usual exercises and follow other instructions from your health care provider. °· Eat and drink lightly if you think you are going into labor. °· If Braxton Hicks contractions are making you uncomfortable: °? Change your position from lying down or resting to walking, or change from walking to resting. °? Sit and rest in a tub of warm water. °? Drink enough fluid to keep your urine pale yellow. Dehydration may cause these contractions. °? Do slow and deep breathing several times an hour. °· Keep all follow-up prenatal visits as told by your health care provider. This is important. °Contact a health care provider if: °· You have a fever. °· You have continuous pain in your abdomen. °Get help right away if: °· Your contractions become stronger, more regular, and closer together. °· You have fluid leaking or gushing from your vagina. °· You pass blood-tinged mucus (bloody show). °· You have bleeding from your vagina. °· You have low back pain that you never had before. °· You feel your baby’s head pushing down and causing pelvic pressure. °· Your baby is not moving inside you as much as it used to. °Summary °· Contractions that occur before labor are called Braxton Hicks contractions, false labor, or practice contractions. °· Braxton Hicks contractions are usually shorter,  weaker, farther apart, and less regular than true labor contractions. True labor contractions usually become progressively stronger and regular and they become more frequent. °· Manage discomfort from Braxton Hicks contractions by changing position, resting in a warm bath, drinking plenty of water, or practicing deep breathing. °This information is not intended to replace advice given to you by your health   care provider. Make sure you discuss any questions you have with your health care provider. °Document Released: 11/11/2016 Document Revised: 11/11/2016 Document Reviewed: 11/11/2016 °[employer] Interactive Patient Education © 2018 [employer] ° °

## 2017-07-26 NOTE — Progress Notes (Signed)
ROB- pt is doing well 

## 2017-08-09 ENCOUNTER — Encounter: Payer: Managed Care, Other (non HMO) | Admitting: Certified Nurse Midwife

## 2017-08-11 ENCOUNTER — Ambulatory Visit (INDEPENDENT_AMBULATORY_CARE_PROVIDER_SITE_OTHER): Payer: Managed Care, Other (non HMO) | Admitting: Certified Nurse Midwife

## 2017-08-11 VITALS — BP 142/65 | HR 67 | Wt 190.6 lb

## 2017-08-11 DIAGNOSIS — Z3493 Encounter for supervision of normal pregnancy, unspecified, third trimester: Secondary | ICD-10-CM | POA: Diagnosis not present

## 2017-08-11 LAB — POCT URINALYSIS DIPSTICK
BILIRUBIN UA: NEGATIVE
Blood, UA: NEGATIVE
Glucose, UA: NEGATIVE
Ketones, UA: NEGATIVE
Nitrite, UA: NEGATIVE
PH UA: 6 (ref 5.0–8.0)
Protein, UA: NEGATIVE
Spec Grav, UA: 1.01 (ref 1.010–1.025)
UROBILINOGEN UA: 0.2 U/dL

## 2017-08-11 NOTE — Patient Instructions (Addendum)
Vaginal Delivery Vaginal delivery means that you will give birth by pushing your baby out of your birth canal (vagina). A team of health care providers will help you before, during, and after vaginal delivery. Birth experiences are unique for every woman and every pregnancy, and birth experiences vary depending on where you choose to give birth. What should I do to prepare for my baby's birth? Before your baby is born, it is important to talk with your health care provider about:  Your labor and delivery preferences. These may include: ? Medicines that you may be given. ? How you will manage your pain. This might include non-medical pain relief techniques or injectable pain relief such as epidural analgesia. ? How you and your baby will be monitored during labor and delivery. ? Who may be in the labor and delivery room with you. ? Your feelings about surgical delivery of your baby (cesarean delivery, or C-section) if this becomes necessary. ? Your feelings about receiving donated blood through an IV tube (blood transfusion) if this becomes necessary.  Whether you are able: ? To take pictures or videos of the birth. ? To eat during labor and delivery. ? To move around, walk, or change positions during labor and delivery.  What to expect after your baby is born, such as: ? Whether delayed umbilical cord clamping and cutting is offered. ? Who will care for your baby right after birth. ? Medicines or tests that may be recommended for your baby. ? Whether breastfeeding is supported in your hospital or birth center. ? How long you will be in the hospital or birth center.  How any medical conditions you have may affect your baby or your labor and delivery experience.  To prepare for your baby's birth, you should also:  Attend all of your health care visits before delivery (prenatal visits) as recommended by your health care provider. This is important.  Prepare your home for your baby's  arrival. Make sure that you have: ? Diapers. ? Baby clothing. ? Feeding equipment. ? Safe sleeping arrangements for you and your baby.  Install a car seat in your vehicle. Have your car seat checked by a certified car seat installer to make sure that it is installed safely.  Think about who will help you with your new baby at home for at least the first several weeks after delivery.  What can I expect when I arrive at the birth center or hospital? Once you are in labor and have been admitted into the hospital or birth center, your health care provider may:  Review your pregnancy history and any concerns you have.  Insert an IV tube into one of your veins. This is used to give you fluids and medicines.  Check your blood pressure, pulse, temperature, and heart rate (vital signs).  Check whether your bag of water (amniotic sac) has broken (ruptured).  Talk with you about your birth plan and discuss pain control options.  Monitoring Your health care provider may monitor your contractions (uterine monitoring) and your baby's heart rate (fetal monitoring). You may need to be monitored:  Often, but not continuously (intermittently).  All the time or for long periods at a time (continuously). Continuous monitoring may be needed if: ? You are taking certain medicines, such as medicine to relieve pain or make your contractions stronger. ? You have pregnancy or labor complications.  Monitoring may be done by:  Placing a special stethoscope or a handheld monitoring device on your abdomen to   check your baby's heartbeat, and feeling your abdomen for contractions. This method of monitoring does not continuously record your baby's heartbeat or your contractions.  Placing monitors on your abdomen (external monitors) to record your baby's heartbeat and the frequency and length of contractions. You may not have to wear external monitors all the time.  Placing monitors inside of your uterus  (internal monitors) to record your baby's heartbeat and the frequency, length, and strength of your contractions. ? Your health care provider may use internal monitors if he or she needs more information about the strength of your contractions or your baby's heart rate. ? Internal monitors are put in place by passing a thin, flexible wire through your vagina and into your uterus. Depending on the type of monitor, it may remain in your uterus or on your baby's head until birth. ? Your health care provider will discuss the benefits and risks of internal monitoring with you and will ask for your permission before inserting the monitors.  Telemetry. This is a type of continuous monitoring that can be done with external or internal monitors. Instead of having to stay in bed, you are able to move around during telemetry. Ask your health care provider if telemetry is an option for you.  Physical exam Your health care provider may perform a physical exam. This may include:  Checking whether your baby is positioned: ? With the head toward your vagina (head-down). This is most common. ? With the head toward the top of your uterus (head-up or breech). If your baby is in a breech position, your health care provider may try to turn your baby to a head-down position so you can deliver vaginally. If it does not seem that your baby can be born vaginally, your provider may recommend surgery to deliver your baby. In rare cases, you may be able to deliver vaginally if your baby is head-up (breech delivery). ? Lying sideways (transverse). Babies that are lying sideways cannot be delivered vaginally.  Checking your cervix to determine: ? Whether it is thinning out (effacing). ? Whether it is opening up (dilating). ? How low your baby has moved into your birth canal.  What are the three stages of labor and delivery?  Normal labor and delivery is divided into the following three stages: Stage 1  Stage 1 is the  longest stage of labor, and it can last for hours or days. Stage 1 includes: ? Early labor. This is when contractions may be irregular, or regular and mild. Generally, early labor contractions are more than 10 minutes apart. ? Active labor. This is when contractions get longer, more regular, more frequent, and more intense. ? The transition phase. This is when contractions happen very close together, are very intense, and may last longer than during any other part of labor.  Contractions generally feel mild, infrequent, and irregular at first. They get stronger, more frequent (about every 2-3 minutes), and more regular as you progress from early labor through active labor and transition.  Many women progress through stage 1 naturally, but you may need help to continue making progress. If this happens, your health care provider may talk with you about: ? Rupturing your amniotic sac if it has not ruptured yet. ? Giving you medicine to help make your contractions stronger and more frequent.  Stage 1 ends when your cervix is completely dilated to 4 inches (10 cm) and completely effaced. This happens at the end of the transition phase. Stage 2  Once   your cervix is completely effaced and dilated to 4 inches (10 cm), you may start to feel an urge to push. It is common for the body to naturally take a rest before feeling the urge to push, especially if you received an epidural or certain other pain medicines. This rest period may last for up to 1-2 hours, depending on your unique labor experience.  During stage 2, contractions are generally less painful, because pushing helps relieve contraction pain. Instead of contraction pain, you may feel stretching and burning pain, especially when the widest part of your baby's head passes through the vaginal opening (crowning).  Your health care provider will closely monitor your pushing progress and your baby's progress through the vagina during stage 2.  Your  health care provider may massage the area of skin between your vaginal opening and anus (perineum) or apply warm compresses to your perineum. This helps it stretch as the baby's head starts to crown, which can help prevent perineal tearing. ? In some cases, an incision may be made in your perineum (episiotomy) to allow the baby to pass through the vaginal opening. An episiotomy helps to make the opening of the vagina larger to allow more room for the baby to fit through.  It is very important to breathe and focus so your health care provider can control the delivery of your baby's head. Your health care provider may have you decrease the intensity of your pushing, to help prevent perineal tearing.  After delivery of your baby's head, the shoulders and the rest of the body generally deliver very quickly and without difficulty.  Once your baby is delivered, the umbilical cord may be cut right away, or this may be delayed for 1-2 minutes, depending on your baby's health. This may vary among health care providers, hospitals, and birth centers.  If you and your baby are healthy enough, your baby may be placed on your chest or abdomen to help maintain the baby's temperature and to help you bond with each other. Some mothers and babies start breastfeeding at this time. Your health care team will dry your baby and help keep your baby warm during this time.  Your baby may need immediate care if he or she: ? Showed signs of distress during labor. ? Has a medical condition. ? Was born too early (prematurely). ? Had a bowel movement before birth (meconium). ? Shows signs of difficulty transitioning from being inside the uterus to being outside of the uterus. If you are planning to breastfeed, your health care team will help you begin a feeding. Stage 3  The third stage of labor starts immediately after the birth of your baby and ends after you deliver the placenta. The placenta is an organ that develops  during pregnancy to provide oxygen and nutrients to your baby in the womb.  Delivering the placenta may require some pushing, and you may have mild contractions. Breastfeeding can stimulate contractions to help you deliver the placenta.  After the placenta is delivered, your uterus should tighten (contract) and become firm. This helps to stop bleeding in your uterus. To help your uterus contract and to control bleeding, your health care provider may: ? Give you medicine by injection, through an IV tube, by mouth, or through your rectum (rectally). ? Massage your abdomen or perform a vaginal exam to remove any blood clots that are left in your uterus. ? Empty your bladder by placing a thin, flexible tube (catheter) into your bladder. ? Encourage   you to breastfeed your baby. After labor is over, you and your baby will be monitored closely to ensure that you are both healthy until you are ready to go home. Your health care team will teach you how to care for yourself and your baby. This information is not intended to replace advice given to you by your health care provider. Make sure you discuss any questions you have with your health care provider. Document Released: 04/06/2008 Document Revised: 01/16/2016 Document Reviewed: 07/13/2015 Elsevier Interactive Patient Education  2018 [employer] Pain Relief During Labor and Delivery Many things can cause pain during labor and delivery, including:  Pressure on bones and ligaments due to the baby moving through the pelvis.  Stretching of tissues due to the baby moving through the birth canal.  Muscle tension due to anxiety or nervousness.  The uterus tightening (contracting) and relaxing to help move the baby.  There are many ways to deal with the pain of labor and delivery. They include:  Taking prenatal classes. Taking these classes helps you know what to expect during your baby's birth. What you learn will increase your confidence and  decrease your anxiety.  Practicing relaxation techniques or doing relaxing activities, such as: ? Focused breathing. ? Meditation. ? Visualization. ? Aroma therapy. ? Listening to your favorite music. ? Hypnosis.  Taking a warm shower or bath (hydrotherapy). This may: ? Provide comfort and relaxation. ? Lessen your perception of pain. ? Decrease the amount of pain medicine needed. ? Decrease the length of labor.  Getting a massage or counterpressure on your back.  Applying warm packs or ice packs.  Changing positions often, moving around, or using a birthing ball.  Getting: ? Pain medicine through an IV or injection into a muscle. ? Pain medicine inserted into your spinal column. ? Injections of sterile water just under the skin on your lower back (intradermal injections). ? Laughing gas (nitrous oxide).  Discuss your pain control options with your health care provider during your prenatal visits. Explore the options offered by your hospital or birth center. What kinds of medicine are available? There are two kinds of medicines that can be used to relieve pain during labor and delivery:  Analgesics. These medicines decrease pain without causing you to lose feeling or the ability to move your muscles.  Anesthetics. These medicines block feeling in the body and can decrease your ability to move freely.  Both of these kinds of medicine can cause minor side effects, such as nausea, trouble concentrating, and sleepiness. They can also decrease the baby's heart rate before birth and affect the baby's breathing rate after birth. For this reason, health care providers are careful about when and how much medicine is given. What are specific medicines and procedures that provide pain relief? Local Anesthetics Local anesthetics are used to numb a small area of the body. They may be used along with another kind of anesthetic or used to numb the nerves of the vagina, cervix, and perineum  during the second stage of labor. General Anesthetics General anesthetics cause you to lose consciousness so you do not feel pain. They are usually only used for an emergency cesarean delivery. General anesthetics are given through an IV tube and a mask. Pudendal Block A pudendal block is a form of local anesthetic. It may be used to relieve the pain associated with pushing or stretching of the perineum at the time of delivery or to further numb the perineum. A pudendal block is done   by injecting numbing medicine through the vaginal wall into a nerve in the pelvis. Epidural Analgesia Epidural analgesia is given through a flexible IV catheter that is inserted into the lower back. Numbing medicine is delivered continuously to the area near your spinal column nerves (epidural space). After having this type of analgesia, you may be able to move your legs but you most likely will not be able to walk. Depending on the amount of medicine given, you may lose all feeling in the lower half of your body, or you may retain some level of sensation, including the urge to push. Epidural analgesia can be used to provide pain relief for a vaginal birth. Spinal Block A spinal block is similar to epidural analgesia, but the medicine is injected into the spinal fluid instead of the epidural space. A spinal block is only given once. It starts to relieve pain quickly, but the pain relief lasts only 1-6 hours. Spinal blocks can be used for cesarean deliveries. Combined Spinal-Epidural (CSE) Block A CSE block combines the effects of a spinal block and epidural analgesia. The spinal block works quickly to block all pain. The epidural analgesia provides continuous pain relief, even after the effects of the spinal block have worn off. This information is not intended to replace advice given to you by your health care provider. Make sure you discuss any questions you have with your health care provider. Document Released:  10/14/2008 Document Revised: 12/05/2015 Document Reviewed: 11/19/2015 Elsevier Interactive Patient Education  2018 [employer].  Hypertension During Pregnancy Hypertension is also called high blood pressure. High blood pressure means that the force of your blood moving in your body is too strong. When you are pregnant, this condition should be watched carefully. It can cause problems for you and your baby. Follow these instructions at home: Eating and drinking  Drink enough fluid to keep your pee (urine) clear or pale yellow.  Eat healthy foods that are low in salt (sodium). ? Do not add salt to your food. ? Check labels on foods and drinks to see much salt is in them. Look on the label where you see "Sodium." Lifestyle  Do not use any products that contain nicotine or tobacco, such as cigarettes and e-cigarettes. If you need help quitting, ask your doctor.  Do not use alcohol.  Avoid caffeine.  Avoid stress. Rest and get plenty of sleep. General instructions  Take over-the-counter and prescription medicines only as told by your doctor.  While lying down, lie on your left side. This keeps pressure off your baby.  While sitting or lying down, raise (elevate) your feet. Try putting some pillows under your lower legs.  Exercise regularly. Ask your doctor what kinds of exercise are best for you.  Keep all prenatal and follow-up visits as told by your doctor. This is important. Contact a doctor if:  You have symptoms that your doctor told you to watch for, such as: ? Fever. ? Throwing up (vomiting). ? Headache. Get help right away if:  You have very bad pain in your belly (abdomen).  You are throwing up, and this does not get better with treatment.  You suddenly get swelling in your hands, ankles, or face.  You gain 4 lb (1.8 kg) or more in 1 week.  You get bleeding from your vagina.  You have blood in your pee.  You do not feel your baby moving as much as  normal.  You have a change in vision.  You have  muscle twitching or sudden tightening (spasms).  You have trouble breathing.  Your lips or fingernails turn blue. This information is not intended to replace advice given to you by your health care provider. Make sure you discuss any questions you have with your health care provider. Document Released: 07/31/2010 Document Revised: 03/09/2016 Document Reviewed: 03/09/2016 Elsevier Interactive Patient Education  2018 [employer]  Preeclampsia and Eclampsia Preeclampsia is a serious condition that develops only during pregnancy. It is also called toxemia of pregnancy. This condition causes high blood pressure along with other symptoms, such as swelling and headaches. These symptoms may develop as the condition gets worse. Preeclampsia may occur at 20 weeks of pregnancy or later. Diagnosing and treating preeclampsia early is very important. If not treated early, it can cause serious problems for you and your baby. One problem it can lead to is eclampsia, which is a condition that causes muscle jerking or shaking (convulsions or seizures) in the mother. Delivering your baby is the best treatment for preeclampsia or eclampsia. Preeclampsia and eclampsia symptoms usually go away after your baby is born. What are the causes? The cause of preeclampsia is not known. What increases the risk? The following risk factors make you more likely to develop preeclampsia:  Being pregnant for the first time.  Having had preeclampsia during a past pregnancy.  Having a family history of preeclampsia.  Having high blood pressure.  Being pregnant with twins or triplets.  Being 7035 or older.  Being African-American.  Having kidney disease or diabetes.  Having medical conditions such as lupus or blood diseases.  Being very overweight (obese).  What are the signs or symptoms? The earliest signs of preeclampsia are:  High blood pressure.  Increased  protein in your urine. Your health care provider will check for this at every visit before you give birth (prenatal visit).  Other symptoms that may develop as the condition gets worse include:  Severe headaches.  Sudden weight gain.  Swelling of the hands, face, legs, and feet.  Nausea and vomiting.  Vision problems, such as blurred or double vision.  Numbness in the face, arms, legs, and feet.  Urinating less than usual.  Dizziness.  Slurred speech.  Abdominal pain, especially upper abdominal pain.  Convulsions or seizures.  Symptoms generally go away after giving birth. How is this diagnosed? There are no screening tests for preeclampsia. Your health care provider will ask you about symptoms and check for signs of preeclampsia during your prenatal visits. You may also have tests that include:  Urine tests.  Blood tests.  Checking your blood pressure.  Monitoring your baby's heart rate.  Ultrasound.  How is this treated? You and your health care provider will determine the treatment approach that is best for you. Treatment may include:  Having more frequent prenatal exams to check for signs of preeclampsia, if you have an increased risk for preeclampsia.  Bed rest.  Reducing how much salt (sodium) you eat.  Medicine to lower your blood pressure.  Staying in the hospital, if your condition is severe. There, treatment will focus on controlling your blood pressure and the amount of fluids in your body (fluid retention).  You may need to take medicine (magnesium sulfate) to prevent seizures. This medicine may be given as an injection or through an IV tube.  Delivering your baby early, if your condition gets worse. You may have your labor started with medicine (induced), or you may have a cesarean delivery.  Follow these instructions at  home: Eating and drinking   Drink enough fluid to keep your urine clear or pale yellow.  Eat a healthy diet that is low in  sodium. Do not add salt to your food. Check nutrition labels to see how much sodium a food or beverage contains.  Avoid caffeine. Lifestyle  Do not use any products that contain nicotine or tobacco, such as cigarettes and e-cigarettes. If you need help quitting, ask your health care provider.  Do not use alcohol or drugs.  Avoid stress as much as possible. Rest and get plenty of sleep. General instructions  Take over-the-counter and prescription medicines only as told by your health care provider.  When lying down, lie on your side. This keeps pressure off of your baby.  When sitting or lying down, raise (elevate) your feet. Try putting some pillows underneath your lower legs.  Exercise regularly. Ask your health care provider what kinds of exercise are best for you.  Keep all follow-up and prenatal visits as told by your health care provider. This is important. How is this prevented? To prevent preeclampsia or eclampsia from developing during another pregnancy:  Get proper medical care during pregnancy. Your health care provider may be able to prevent preeclampsia or diagnose and treat it early.  Your health care provider may have you take a low-dose aspirin or a calcium supplement during your next pregnancy.  You may have tests of your blood pressure and kidney function after giving birth.  Maintain a healthy weight. Ask your health care provider for help managing weight gain during pregnancy.  Work with your health care provider to manage any long-term (chronic) health conditions you have, such as diabetes or kidney problems.  Contact a health care provider if:  You gain more weight than expected.  You have headaches.  You have nausea or vomiting.  You have abdominal pain.  You feel dizzy or light-headed. Get help right away if:  You develop sudden or severe swelling anywhere in your body. This usually happens in the legs.  You gain 5 lbs (2.3 kg) or more during  one week.  You have severe: ? Abdominal pain. ? Headaches. ? Dizziness. ? Vision problems. ? Confusion. ? Nausea or vomiting.  You have a seizure.  You have trouble moving any part of your body.  You develop numbness in any part of your body.  You have trouble speaking.  You have any abnormal bleeding.  You pass out. This information is not intended to replace advice given to you by your health care provider. Make sure you discuss any questions you have with your health care provider. Document Released: 06/25/2000 Document Revised: 02/24/2016 Document Reviewed: 02/02/2016 Elsevier Interactive Patient Education  [employer].

## 2017-08-11 NOTE — Progress Notes (Signed)
ROB- pt is doing well 

## 2017-08-11 NOTE — Progress Notes (Signed)
ROB-Doing well. Concerned about blood pressure and weight gain. Discussed signs and symptoms of preeclampsia. Will keep daily blood pressure and weight logs. Desires doula in labor, encouraged to contact volunteer service. Rx: Breastpump given. Reviewed red flag symptoms and when to call. RTC x 2 weeks for ROB or sooner if needed.

## 2017-08-16 ENCOUNTER — Encounter: Payer: Self-pay | Admitting: Certified Nurse Midwife

## 2017-08-26 ENCOUNTER — Ambulatory Visit (INDEPENDENT_AMBULATORY_CARE_PROVIDER_SITE_OTHER): Payer: Managed Care, Other (non HMO) | Admitting: Certified Nurse Midwife

## 2017-08-26 ENCOUNTER — Encounter: Payer: Self-pay | Admitting: Certified Nurse Midwife

## 2017-08-26 VITALS — BP 141/76 | HR 71 | Wt 190.8 lb

## 2017-08-26 DIAGNOSIS — Z3401 Encounter for supervision of normal first pregnancy, first trimester: Secondary | ICD-10-CM

## 2017-08-26 LAB — POCT URINALYSIS DIPSTICK
BILIRUBIN UA: NEGATIVE
Glucose, UA: NEGATIVE
Ketones, UA: NEGATIVE
Leukocytes, UA: NEGATIVE
Nitrite, UA: NEGATIVE
Spec Grav, UA: 1.02 (ref 1.010–1.025)
UROBILINOGEN UA: 0.2 U/dL
pH, UA: 6.5 (ref 5.0–8.0)

## 2017-08-26 NOTE — Progress Notes (Signed)
ROB- pt is doing well 

## 2017-08-26 NOTE — Patient Instructions (Signed)
Braxton Hicks Contractions °Contractions of the uterus can occur throughout pregnancy, but they are not always a sign that you are in labor. You may have practice contractions called Braxton Hicks contractions. These false labor contractions are sometimes confused with true labor. °What are Braxton Hicks contractions? °Braxton Hicks contractions are tightening movements that occur in the muscles of the uterus before labor. Unlike true labor contractions, these contractions do not result in opening (dilation) and thinning of the cervix. Toward the end of pregnancy (32-34 weeks), Braxton Hicks contractions can happen more often and may become stronger. These contractions are sometimes difficult to tell apart from true labor because they can be very uncomfortable. You should not feel embarrassed if you go to the hospital with false labor. °Sometimes, the only way to tell if you are in true labor is for your health care provider to look for changes in the cervix. The health care provider will do a physical exam and may monitor your contractions. If you are not in true labor, the exam should show that your cervix is not dilating and your water has not broken. °If there are other health problems associated with your pregnancy, it is completely safe for you to be sent home with false labor. You may continue to have Braxton Hicks contractions until you go into true labor. °How to tell the difference between true labor and false labor °True labor °· Contractions last 30-70 seconds. °· Contractions become very regular. °· Discomfort is usually felt in the top of the uterus, and it spreads to the lower abdomen and low back. °· Contractions do not go away with walking. °· Contractions usually become more intense and increase in frequency. °· The cervix dilates and gets thinner. °False labor °· Contractions are usually shorter and not as strong as true labor contractions. °· Contractions are usually irregular. °· Contractions  are often felt in the front of the lower abdomen and in the groin. °· Contractions may go away when you walk around or change positions while lying down. °· Contractions get weaker and are shorter-lasting as time goes on. °· The cervix usually does not dilate or become thin. °Follow these instructions at home: °· Take over-the-counter and prescription medicines only as told by your health care provider. °· Keep up with your usual exercises and follow other instructions from your health care provider. °· Eat and drink lightly if you think you are going into labor. °· If Braxton Hicks contractions are making you uncomfortable: °? Change your position from lying down or resting to walking, or change from walking to resting. °? Sit and rest in a tub of warm water. °? Drink enough fluid to keep your urine pale yellow. Dehydration may cause these contractions. °? Do slow and deep breathing several times an hour. °· Keep all follow-up prenatal visits as told by your health care provider. This is important. °Contact a health care provider if: °· You have a fever. °· You have continuous pain in your abdomen. °Get help right away if: °· Your contractions become stronger, more regular, and closer together. °· You have fluid leaking or gushing from your vagina. °· You pass blood-tinged mucus (bloody show). °· You have bleeding from your vagina. °· You have low back pain that you never had before. °· You feel your baby’s head pushing down and causing pelvic pressure. °· Your baby is not moving inside you as much as it used to. °Summary °· Contractions that occur before labor are called Braxton   Hicks contractions, false labor, or practice contractions. °· Braxton Hicks contractions are usually shorter, weaker, farther apart, and less regular than true labor contractions. True labor contractions usually become progressively stronger and regular and they become more frequent. °· Manage discomfort from Braxton Hicks contractions by  changing position, resting in a warm bath, drinking plenty of water, or practicing deep breathing. °This information is not intended to replace advice given to you by your health care provider. Make sure you discuss any questions you have with your health care provider. °Document Released: 11/11/2016 Document Revised: 11/11/2016 Document Reviewed: 11/11/2016 °Elsevier Interactive Patient Education © 2018 [employer] ° °

## 2017-08-26 NOTE — Progress Notes (Signed)
ROB, doing well. No complaints. BP slightly elevated this visit. She denies headache, epigastric pain, swelling. 1+ reflexes bilaterally no clonus present.  She states she has had some floaters but has experienced that through out the pregnancy. Will get baseline labs today. GBS culture not done- GBS positive in urine at [facility]. GC/Chlamydia checked in urine. Will follow up with labs. ROB 1 wk.   Pattricia BossAnnie Lenay Lovejoy< CNM

## 2017-08-27 LAB — CBC WITH DIFFERENTIAL/PLATELET
BASOS: 0 %
Basophils Absolute: 0 10*3/uL (ref 0.0–0.2)
EOS (ABSOLUTE): 0.1 10*3/uL (ref 0.0–0.4)
EOS: 1 %
HEMATOCRIT: 43.8 % (ref 34.0–46.6)
Hemoglobin: 14.7 g/dL (ref 11.1–15.9)
Immature Grans (Abs): 0 10*3/uL (ref 0.0–0.1)
Immature Granulocytes: 0 %
LYMPHS ABS: 1.5 10*3/uL (ref 0.7–3.1)
Lymphs: 20 %
MCH: 29.9 pg (ref 26.6–33.0)
MCHC: 33.6 g/dL (ref 31.5–35.7)
MCV: 89 fL (ref 79–97)
MONOS ABS: 0.7 10*3/uL (ref 0.1–0.9)
Monocytes: 9 %
Neutrophils Absolute: 5.5 10*3/uL (ref 1.4–7.0)
Neutrophils: 70 %
Platelets: 214 10*3/uL (ref 150–379)
RBC: 4.91 x10E6/uL (ref 3.77–5.28)
RDW: 14.6 % (ref 12.3–15.4)
WBC: 7.8 10*3/uL (ref 3.4–10.8)

## 2017-08-27 LAB — COMPREHENSIVE METABOLIC PANEL
A/G RATIO: 1.5 (ref 1.2–2.2)
ALK PHOS: 160 IU/L — AB (ref 39–117)
ALT: 8 IU/L (ref 0–32)
AST: 16 IU/L (ref 0–40)
Albumin: 3.8 g/dL (ref 3.5–5.5)
BILIRUBIN TOTAL: 0.5 mg/dL (ref 0.0–1.2)
BUN/Creatinine Ratio: 18 (ref 9–23)
BUN: 10 mg/dL (ref 6–20)
CO2: 18 mmol/L — ABNORMAL LOW (ref 20–29)
Calcium: 9.1 mg/dL (ref 8.7–10.2)
Chloride: 103 mmol/L (ref 96–106)
Creatinine, Ser: 0.55 mg/dL — ABNORMAL LOW (ref 0.57–1.00)
GFR calc Af Amer: 148 mL/min/{1.73_m2} (ref >59)
GFR calc non Af Amer: 128 mL/min/{1.73_m2} (ref >59)
GLOBULIN, TOTAL: 2.5 g/dL (ref 1.5–4.5)
Glucose: 71 mg/dL (ref 65–99)
POTASSIUM: 3.9 mmol/L (ref 3.5–5.2)
SODIUM: 138 mmol/L (ref 134–144)
Total Protein: 6.3 g/dL (ref 6.0–8.5)

## 2017-08-27 LAB — PROTEIN / CREATININE RATIO, URINE
CREATININE, UR: 77.4 mg/dL
PROTEIN UR: 12.7 mg/dL
Protein/Creat Ratio: 164 mg/g creat (ref 0–200)

## 2017-08-28 ENCOUNTER — Encounter: Payer: Self-pay | Admitting: Certified Nurse Midwife

## 2017-08-30 LAB — GC/CHLAMYDIA PROBE AMP
CHLAMYDIA, DNA PROBE: NEGATIVE
Neisseria gonorrhoeae by PCR: NEGATIVE

## 2017-09-02 ENCOUNTER — Ambulatory Visit (INDEPENDENT_AMBULATORY_CARE_PROVIDER_SITE_OTHER): Payer: Managed Care, Other (non HMO) | Admitting: Certified Nurse Midwife

## 2017-09-02 ENCOUNTER — Encounter: Payer: Self-pay | Admitting: Certified Nurse Midwife

## 2017-09-02 VITALS — BP 126/80 | HR 62 | Wt 191.9 lb

## 2017-09-02 DIAGNOSIS — Z3403 Encounter for supervision of normal first pregnancy, third trimester: Secondary | ICD-10-CM | POA: Diagnosis not present

## 2017-09-02 LAB — POCT URINALYSIS DIPSTICK
Bilirubin, UA: NEGATIVE
Blood, UA: NEGATIVE
Glucose, UA: NEGATIVE
Ketones, UA: NEGATIVE
Leukocytes, UA: NEGATIVE
NITRITE UA: NEGATIVE
ODOR: NEGATIVE
PH UA: 6 (ref 5.0–8.0)
PROTEIN UA: NEGATIVE
Spec Grav, UA: 1.01 (ref 1.010–1.025)
UROBILINOGEN UA: 0.2 U/dL

## 2017-09-02 NOTE — Progress Notes (Signed)
ROB- no complaints.  

## 2017-09-02 NOTE — Patient Instructions (Signed)
Vaginal Delivery Vaginal delivery means that you will give birth by pushing your baby out of your birth canal (vagina). A team of health care providers will help you before, during, and after vaginal delivery. Birth experiences are unique for every woman and every pregnancy, and birth experiences vary depending on where you choose to give birth. What should I do to prepare for my baby's birth? Before your baby is born, it is important to talk with your health care provider about:  Your labor and delivery preferences. These may include: ? Medicines that you may be given. ? How you will manage your pain. This might include non-medical pain relief techniques or injectable pain relief such as epidural analgesia. ? How you and your baby will be monitored during labor and delivery. ? Who may be in the labor and delivery room with you. ? Your feelings about surgical delivery of your baby (cesarean delivery, or C-section) if this becomes necessary. ? Your feelings about receiving donated blood through an IV tube (blood transfusion) if this becomes necessary.  Whether you are able: ? To take pictures or videos of the birth. ? To eat during labor and delivery. ? To move around, walk, or change positions during labor and delivery.  What to expect after your baby is born, such as: ? Whether delayed umbilical cord clamping and cutting is offered. ? Who will care for your baby right after birth. ? Medicines or tests that may be recommended for your baby. ? Whether breastfeeding is supported in your hospital or birth center. ? How long you will be in the hospital or birth center.  How any medical conditions you have may affect your baby or your labor and delivery experience.  To prepare for your baby's birth, you should also:  Attend all of your health care visits before delivery (prenatal visits) as recommended by your health care provider. This is important.  Prepare your home for your baby's  arrival. Make sure that you have: ? Diapers. ? Baby clothing. ? Feeding equipment. ? Safe sleeping arrangements for you and your baby.  Install a car seat in your vehicle. Have your car seat checked by a certified car seat installer to make sure that it is installed safely.  Think about who will help you with your new baby at home for at least the first several weeks after delivery.  What can I expect when I arrive at the birth center or hospital? Once you are in labor and have been admitted into the hospital or birth center, your health care provider may:  Review your pregnancy history and any concerns you have.  Insert an IV tube into one of your veins. This is used to give you fluids and medicines.  Check your blood pressure, pulse, temperature, and heart rate (vital signs).  Check whether your bag of water (amniotic sac) has broken (ruptured).  Talk with you about your birth plan and discuss pain control options.  Monitoring Your health care provider may monitor your contractions (uterine monitoring) and your baby's heart rate (fetal monitoring). You may need to be monitored:  Often, but not continuously (intermittently).  All the time or for long periods at a time (continuously). Continuous monitoring may be needed if: ? You are taking certain medicines, such as medicine to relieve pain or make your contractions stronger. ? You have pregnancy or labor complications.  Monitoring may be done by:  Placing a special stethoscope or a handheld monitoring device on your abdomen to   check your baby's heartbeat, and feeling your abdomen for contractions. This method of monitoring does not continuously record your baby's heartbeat or your contractions.  Placing monitors on your abdomen (external monitors) to record your baby's heartbeat and the frequency and length of contractions. You may not have to wear external monitors all the time.  Placing monitors inside of your uterus  (internal monitors) to record your baby's heartbeat and the frequency, length, and strength of your contractions. ? Your health care provider may use internal monitors if he or she needs more information about the strength of your contractions or your baby's heart rate. ? Internal monitors are put in place by passing a thin, flexible wire through your vagina and into your uterus. Depending on the type of monitor, it may remain in your uterus or on your baby's head until birth. ? Your health care provider will discuss the benefits and risks of internal monitoring with you and will ask for your permission before inserting the monitors.  Telemetry. This is a type of continuous monitoring that can be done with external or internal monitors. Instead of having to stay in bed, you are able to move around during telemetry. Ask your health care provider if telemetry is an option for you.  Physical exam Your health care provider may perform a physical exam. This may include:  Checking whether your baby is positioned: ? With the head toward your vagina (head-down). This is most common. ? With the head toward the top of your uterus (head-up or breech). If your baby is in a breech position, your health care provider may try to turn your baby to a head-down position so you can deliver vaginally. If it does not seem that your baby can be born vaginally, your provider may recommend surgery to deliver your baby. In rare cases, you may be able to deliver vaginally if your baby is head-up (breech delivery). ? Lying sideways (transverse). Babies that are lying sideways cannot be delivered vaginally.  Checking your cervix to determine: ? Whether it is thinning out (effacing). ? Whether it is opening up (dilating). ? How low your baby has moved into your birth canal.  What are the three stages of labor and delivery?  Normal labor and delivery is divided into the following three stages: Stage 1  Stage 1 is the  longest stage of labor, and it can last for hours or days. Stage 1 includes: ? Early labor. This is when contractions may be irregular, or regular and mild. Generally, early labor contractions are more than 10 minutes apart. ? Active labor. This is when contractions get longer, more regular, more frequent, and more intense. ? The transition phase. This is when contractions happen very close together, are very intense, and may last longer than during any other part of labor.  Contractions generally feel mild, infrequent, and irregular at first. They get stronger, more frequent (about every 2-3 minutes), and more regular as you progress from early labor through active labor and transition.  Many women progress through stage 1 naturally, but you may need help to continue making progress. If this happens, your health care provider may talk with you about: ? Rupturing your amniotic sac if it has not ruptured yet. ? Giving you medicine to help make your contractions stronger and more frequent.  Stage 1 ends when your cervix is completely dilated to 4 inches (10 cm) and completely effaced. This happens at the end of the transition phase. Stage 2  Once   your cervix is completely effaced and dilated to 4 inches (10 cm), you may start to feel an urge to push. It is common for the body to naturally take a rest before feeling the urge to push, especially if you received an epidural or certain other pain medicines. This rest period may last for up to 1-2 hours, depending on your unique labor experience.  During stage 2, contractions are generally less painful, because pushing helps relieve contraction pain. Instead of contraction pain, you may feel stretching and burning pain, especially when the widest part of your baby's head passes through the vaginal opening (crowning).  Your health care provider will closely monitor your pushing progress and your baby's progress through the vagina during stage 2.  Your  health care provider may massage the area of skin between your vaginal opening and anus (perineum) or apply warm compresses to your perineum. This helps it stretch as the baby's head starts to crown, which can help prevent perineal tearing. ? In some cases, an incision may be made in your perineum (episiotomy) to allow the baby to pass through the vaginal opening. An episiotomy helps to make the opening of the vagina larger to allow more room for the baby to fit through.  It is very important to breathe and focus so your health care provider can control the delivery of your baby's head. Your health care provider may have you decrease the intensity of your pushing, to help prevent perineal tearing.  After delivery of your baby's head, the shoulders and the rest of the body generally deliver very quickly and without difficulty.  Once your baby is delivered, the umbilical cord may be cut right away, or this may be delayed for 1-2 minutes, depending on your baby's health. This may vary among health care providers, hospitals, and birth centers.  If you and your baby are healthy enough, your baby may be placed on your chest or abdomen to help maintain the baby's temperature and to help you bond with each other. Some mothers and babies start breastfeeding at this time. Your health care team will dry your baby and help keep your baby warm during this time.  Your baby may need immediate care if he or she: ? Showed signs of distress during labor. ? Has a medical condition. ? Was born too early (prematurely). ? Had a bowel movement before birth (meconium). ? Shows signs of difficulty transitioning from being inside the uterus to being outside of the uterus. If you are planning to breastfeed, your health care team will help you begin a feeding. Stage 3  The third stage of labor starts immediately after the birth of your baby and ends after you deliver the placenta. The placenta is an organ that develops  during pregnancy to provide oxygen and nutrients to your baby in the womb.  Delivering the placenta may require some pushing, and you may have mild contractions. Breastfeeding can stimulate contractions to help you deliver the placenta.  After the placenta is delivered, your uterus should tighten (contract) and become firm. This helps to stop bleeding in your uterus. To help your uterus contract and to control bleeding, your health care provider may: ? Give you medicine by injection, through an IV tube, by mouth, or through your rectum (rectally). ? Massage your abdomen or perform a vaginal exam to remove any blood clots that are left in your uterus. ? Empty your bladder by placing a thin, flexible tube (catheter) into your bladder. ? Encourage   you to breastfeed your baby. After labor is over, you and your baby will be monitored closely to ensure that you are both healthy until you are ready to go home. Your health care team will teach you how to care for yourself and your baby. This information is not intended to replace advice given to you by your health care provider. Make sure you discuss any questions you have with your health care provider. Document Released: 04/06/2008 Document Revised: 01/16/2016 Document Reviewed: 07/13/2015 Elsevier Interactive Patient Education  2018 [employer] Inc[employer]  

## 2017-09-08 ENCOUNTER — Telehealth: Payer: Self-pay | Admitting: Certified Nurse Midwife

## 2017-09-08 ENCOUNTER — Ambulatory Visit (INDEPENDENT_AMBULATORY_CARE_PROVIDER_SITE_OTHER): Payer: Managed Care, Other (non HMO) | Admitting: Certified Nurse Midwife

## 2017-09-08 VITALS — BP 148/73 | HR 59 | Wt 191.2 lb

## 2017-09-08 DIAGNOSIS — Z3403 Encounter for supervision of normal first pregnancy, third trimester: Secondary | ICD-10-CM

## 2017-09-08 LAB — POCT URINALYSIS DIPSTICK
Bilirubin, UA: NEGATIVE
Blood, UA: NEGATIVE
GLUCOSE UA: NEGATIVE
Ketones, UA: NEGATIVE
LEUKOCYTES UA: NEGATIVE
NITRITE UA: NEGATIVE
PROTEIN UA: NEGATIVE
SPEC GRAV UA: 1.01 (ref 1.010–1.025)
Urobilinogen, UA: 0.2 E.U./dL
pH, UA: 5 (ref 5.0–8.0)

## 2017-09-08 NOTE — Telephone Encounter (Signed)
The patient would like to know if she can eat before her induction tomorrow. Please advise.

## 2017-09-08 NOTE — Patient Instructions (Signed)
Labor Induction Labor induction is when steps are taken to cause a pregnant woman to begin the labor process. Most women go into labor on their own between 37 weeks and 42 weeks of the pregnancy. When this does not happen or when there is a medical need, methods may be used to induce labor. Labor induction causes a pregnant woman's uterus to contract. It also causes the cervix to soften (ripen), open (dilate), and thin out (efface). Usually, labor is not induced before 39 weeks of the pregnancy unless there is a problem with the baby or mother. Before inducing labor, your health care provider will consider a number of factors, including the following:  The medical condition of you and the baby.  How many weeks along you are.  The status of the baby's lung maturity.  The condition of the cervix.  The position of the baby.  What are the reasons for labor induction? Labor may be induced for the following reasons:  The health of the baby or mother is at risk.  The pregnancy is overdue by 1 week or more.  The water breaks but labor does not start on its own.  The mother has a health condition or serious illness, such as high blood pressure, infection, placental abruption, or diabetes.  The amniotic fluid amounts are low around the baby.  The baby is distressed.  Convenience or wanting the baby to be born on a certain date is not a reason for inducing labor. What methods are used for labor induction? Several methods of labor induction may be used, such as:  Prostaglandin medicine. This medicine causes the cervix to dilate and ripen. The medicine will also start contractions. It can be taken by mouth or by inserting a suppository into the vagina.  Inserting a thin tube (catheter) with a balloon on the end into the vagina to dilate the cervix. Once inserted, the balloon is expanded with water, which causes the cervix to open.  Stripping the membranes. Your health care provider separates  amniotic sac tissue from the cervix, causing the cervix to be stretched and causing the release of a hormone called progesterone. This may cause the uterus to contract. It is often done during an office visit. You will be sent home to wait for the contractions to begin. You will then come in for an induction.  Breaking the water. Your health care provider makes a hole in the amniotic sac using a small instrument. Once the amniotic sac breaks, contractions should begin. This may still take hours to see an effect.  Medicine to trigger or strengthen contractions. This medicine is given through an IV access tube inserted into a vein in your arm.  All of the methods of induction, besides stripping the membranes, will be done in the hospital. Induction is done in the hospital so that you and the baby can be carefully monitored. How long does it take for labor to be induced? Some inductions can take up to 2-3 days. Depending on the cervix, it usually takes less time. It takes longer when you are induced early in the pregnancy or if this is your first pregnancy. If a mother is still pregnant and the induction has been going on for 2-3 days, either the mother will be sent home or a cesarean delivery will be needed. What are the risks associated with labor induction? Some of the risks of induction include:  Changes in fetal heart rate, such as too high, too low, or erratic.    Fetal distress.  Chance of infection for the mother and baby.  Increased chance of having a cesarean delivery.  Breaking off (abruption) of the placenta from the uterus (rare).  Uterine rupture (very rare).  When induction is needed for medical reasons, the benefits of induction may outweigh the risks. What are some reasons for not inducing labor? Labor induction should not be done if:  It is shown that your baby does not tolerate labor.  You have had previous surgeries on your uterus, such as a myomectomy or the removal of  fibroids.  Your placenta lies very low in the uterus and blocks the opening of the cervix (placenta previa).  Your baby is not in a head-down position.  The umbilical cord drops down into the birth canal in front of the baby. This could cut off the baby's blood and oxygen supply.  You have had a previous cesarean delivery.  There are unusual circumstances, such as the baby being extremely premature.  This information is not intended to replace advice given to you by your health care provider. Make sure you discuss any questions you have with your health care provider. Document Released: 11/17/2006 Document Revised: 12/04/2015 Document Reviewed: 01/25/2013 Elsevier Interactive Patient Education  2017 [employer] Augmentation of Labor Augmentation of labor is when steps are taken to stimulate and strengthen uterine contractions during labor. This may be done when the contractions have slowed down or stopped, delaying progress of labor and delivery of the baby. Before beginning augmentation of labor, the health care provider will evaluate the condition of the mother and baby, the size and position of the baby, and the size of the birth canal. What are the reasons for labor augmentation? Reasons for augmentation of labor include:  Slow labor (prolonged first and second stage of labor) that has been associated with increased maternal risks, such as chorioamnionitis, postpartum hemorrhage, operative vaginal delivery, or third-degree or fourth-degree perineal lacerations.  Decreased average length of labor.  What methods are used for labor augmentation? Various methods may be used for augmentation of labor, including:  Oxytocin medicine. This medicine stimulates contractions. It is given through an IV access tube inserted into a vein.  Breaking the fluid-filled sac that surrounds the fetus (amniotic sac).  Stripping the membranes. The health care provider separates amniotic sac tissue from  the cervix, causing the release of a hormone called progesterone that can stimulate uterine contractions.  Nipple stimulation.  Stimulation of certain pressure points on the ankles.  Manual or mechanical dilation of the cervix.  What are the risks associated with labor augmentation?  Overstimulation of the uterine contractions (continuous, prolonged, very strong contractions), causing fetal distress.  Increased chance of infection for the mother and baby.  Uterine tearing (rupture).  Breaking off (abruption) of the placenta.  Increased chance of cesarean, forceps, or vacuum delivery. What are some reasons for not doing labor augmentation? Augmentation of labor should not be done if:  The baby is too big for the birth canal. This can be confirmed by ultrasonography.  The umbilical cord drops in front of the baby's head or breech part (prolapsed cord).  The mother had a previous cesarean delivery with a vertical incision in the uterus (or the kind of incision used is not known). High dose oxytocin should not be used if the mother had a previous cesarean delivery of any kind.  The mother had previous surgery on or into the uterus.  The mother has herpes.  The mother has cervical   cancer.  The baby is lying sideways.  The mother's pelvis is deformed.  The mother is pregnant with more than two babies.  This information is not intended to replace advice given to you by your health care provider. Make sure you discuss any questions you have with your health care provider. Document Released: 12/21/2006 Document Revised: 12/10/2015 Document Reviewed: 01/25/2013 Elsevier Interactive Patient Education  2017 [employer]  

## 2017-09-08 NOTE — Progress Notes (Signed)
ROB-Reports irregular contractions. Denies blurred vision, headaches, or epigastric pain. Extensive discussion regarding gestational hypertension, the ARRIVE study, and options for induction of labor. IOL scheduled tomorrow at 0500. Reviewed red flag symptoms and when to call. RTC x 6 week for PPV or sooner if needed.

## 2017-09-08 NOTE — Progress Notes (Signed)
Pt is here for an ROB visit. 

## 2017-09-08 NOTE — Telephone Encounter (Signed)
[facility]. Pt aware.

## 2017-09-09 ENCOUNTER — Inpatient Hospital Stay: Payer: Managed Care, Other (non HMO) | Admitting: Anesthesiology

## 2017-09-09 ENCOUNTER — Inpatient Hospital Stay
Admission: EM | Admit: 2017-09-09 | Discharge: 2017-09-11 | DRG: 807 | Disposition: A | Discharge status: Home / Self Care | Payer: Managed Care, Other (non HMO) | Attending: Certified Nurse Midwife | Admitting: Certified Nurse Midwife

## 2017-09-09 ENCOUNTER — Other Ambulatory Visit: Payer: Self-pay

## 2017-09-09 DIAGNOSIS — O99824 Streptococcus B carrier state complicating childbirth: Secondary | ICD-10-CM | POA: Diagnosis present

## 2017-09-09 DIAGNOSIS — O134 Gestational [pregnancy-induced] hypertension without significant proteinuria, complicating childbirth: Principal | ICD-10-CM | POA: Diagnosis present

## 2017-09-09 DIAGNOSIS — Z3A39 39 weeks gestation of pregnancy: Secondary | ICD-10-CM

## 2017-09-09 DIAGNOSIS — Z3490 Encounter for supervision of normal pregnancy, unspecified, unspecified trimester: Secondary | ICD-10-CM | POA: Diagnosis present

## 2017-09-09 LAB — PROTEIN / CREATININE RATIO, URINE
Creatinine, Urine: 81 mg/dL
Protein Creatinine Ratio: 0.25 mg/mg{creat} — ABNORMAL HIGH (ref 0.00–0.15)
Total Protein, Urine: 20 mg/dL

## 2017-09-09 LAB — COMPREHENSIVE METABOLIC PANEL
ALT: 11 U/L — ABNORMAL LOW (ref 14–54)
ANION GAP: 9 (ref 5–15)
AST: 24 U/L (ref 15–41)
Albumin: 3.2 g/dL — ABNORMAL LOW (ref 3.5–5.0)
Alkaline Phosphatase: 169 U/L — ABNORMAL HIGH (ref 38–126)
BILIRUBIN TOTAL: 0.7 mg/dL (ref 0.3–1.2)
BUN: 12 mg/dL (ref 6–20)
CO2: 19 mmol/L — ABNORMAL LOW (ref 22–32)
Calcium: 9 mg/dL (ref 8.9–10.3)
Chloride: 107 mmol/L (ref 101–111)
Creatinine, Ser: 0.39 mg/dL — ABNORMAL LOW (ref 0.44–1.00)
GFR calc Af Amer: >60 mL/min (ref >60)
Glucose, Bld: 88 mg/dL (ref 65–99)
Potassium: 3.8 mmol/L (ref 3.5–5.1)
Sodium: 135 mmol/L (ref 135–145)
TOTAL PROTEIN: 6.4 g/dL — AB (ref 6.5–8.1)

## 2017-09-09 LAB — CBC
HEMATOCRIT: 41.5 % (ref 35.0–47.0)
Hemoglobin: 14.2 g/dL (ref 12.0–16.0)
MCH: 30 pg (ref 26.0–34.0)
MCHC: 34.2 g/dL (ref 32.0–36.0)
MCV: 87.6 fL (ref 80.0–100.0)
PLATELETS: 204 10*3/uL (ref 150–440)
RBC: 4.74 MIL/uL (ref 3.80–5.20)
RDW: 14.4 % (ref 11.5–14.5)
WBC: 8.1 10*3/uL (ref 3.6–11.0)

## 2017-09-09 LAB — TYPE AND SCREEN
ABO/RH(D): O POS
Antibody Screen: NEGATIVE

## 2017-09-09 MED ORDER — LIDOCAINE-EPINEPHRINE (PF) 1.5 %-1:200000 IJ SOLN
INTRAMUSCULAR | Status: DC | PRN
  Administered 2017-09-09: 3 mL via EPIDURAL

## 2017-09-09 MED ORDER — SODIUM CHLORIDE 0.9 % IJ SOLN
INTRAMUSCULAR | Status: AC
  Filled 2017-09-09: qty 50

## 2017-09-09 MED ORDER — AMMONIA AROMATIC IN INHA
RESPIRATORY_TRACT | Status: AC
  Filled 2017-09-09: qty 10

## 2017-09-09 MED ORDER — LACTATED RINGERS IV SOLN: 500.0000 mL | Freq: Once | INTRAVENOUS | Status: DC

## 2017-09-09 MED ORDER — PHENYLEPHRINE 40 MCG/ML (10ML) SYRINGE FOR IV PUSH (FOR BLOOD PRESSURE SUPPORT)
80.0000 ug | PREFILLED_SYRINGE | INTRAVENOUS | Status: DC | PRN
  Filled 2017-09-09: qty 5

## 2017-09-09 MED ORDER — MISOPROSTOL 50MCG HALF TABLET
50.0000 ug | ORAL_TABLET | ORAL | Status: DC
  Administered 2017-09-09 (×2): 50 ug via VAGINAL
  Filled 2017-09-09 (×2): qty 1

## 2017-09-09 MED ORDER — ONDANSETRON HCL 4 MG/2ML IJ SOLN
4.0000 mg | Freq: Four times a day (QID) | INTRAMUSCULAR | Status: DC | PRN
  Administered 2017-09-09: 4 mg via INTRAVENOUS
  Filled 2017-09-09: qty 2

## 2017-09-09 MED ORDER — FENTANYL 2.5 MCG/ML W/ROPIVACAINE 0.15% IN NS 100 ML EPIDURAL (ARMC)
EPIDURAL | Status: AC
  Filled 2017-09-09: qty 100

## 2017-09-09 MED ORDER — OXYTOCIN 10 UNIT/ML IJ SOLN
INTRAMUSCULAR | Status: AC
  Filled 2017-09-09: qty 2

## 2017-09-09 MED ORDER — TERBUTALINE SULFATE 1 MG/ML IJ SOLN
0.2500 mg | Freq: Once | INTRAMUSCULAR | Status: DC | PRN
  Filled 2017-09-09: qty 1

## 2017-09-09 MED ORDER — EPHEDRINE 5 MG/ML INJ
10.0000 mg | INTRAVENOUS | Status: DC | PRN
  Filled 2017-09-09: qty 2

## 2017-09-09 MED ORDER — FENTANYL 2.5 MCG/ML W/ROPIVACAINE 0.15% IN NS 100 ML EPIDURAL (ARMC)
EPIDURAL | Status: DC | PRN
  Administered 2017-09-09: 12 mL/h via EPIDURAL

## 2017-09-09 MED ORDER — FENTANYL 2.5 MCG/ML W/ROPIVACAINE 0.15% IN NS 100 ML EPIDURAL (ARMC): 12.0000 mL/h | EPIDURAL | Status: DC

## 2017-09-09 MED ORDER — BUTORPHANOL TARTRATE 1 MG/ML IJ SOLN: 1.0000 mg | INTRAMUSCULAR | Status: DC | PRN

## 2017-09-09 MED ORDER — LIDOCAINE HCL (PF) 1 % IJ SOLN
INTRAMUSCULAR | Status: DC | PRN
  Administered 2017-09-09: 3 mL via SUBCUTANEOUS

## 2017-09-09 MED ORDER — DIPHENHYDRAMINE HCL 50 MG/ML IJ SOLN: 12.5000 mg | INTRAMUSCULAR | Status: DC | PRN

## 2017-09-09 MED ORDER — MISOPROSTOL 200 MCG PO TABS
ORAL_TABLET | ORAL | Status: AC
  Filled 2017-09-09: qty 4

## 2017-09-09 MED ORDER — ACETAMINOPHEN 325 MG PO TABS: 650.0000 mg | ORAL_TABLET | ORAL | Status: DC | PRN

## 2017-09-09 MED ORDER — ZOLPIDEM TARTRATE 5 MG PO TABS: 5.0000 mg | ORAL_TABLET | Freq: Every evening | ORAL | Status: DC | PRN

## 2017-09-09 MED ORDER — OXYCODONE-ACETAMINOPHEN 5-325 MG PO TABS: 1.0000 | ORAL_TABLET | ORAL | Status: DC | PRN

## 2017-09-09 MED ORDER — SOD CITRATE-CITRIC ACID 500-334 MG/5ML PO SOLN: 30.0000 mL | ORAL | Status: DC | PRN

## 2017-09-09 MED ORDER — OXYTOCIN 40 UNITS IN LACTATED RINGERS INFUSION - SIMPLE MED
1.0000 m[IU]/min | INTRAVENOUS | Status: DC
  Administered 2017-09-10: 2 m[IU]/min via INTRAVENOUS

## 2017-09-09 MED ORDER — LACTATED RINGERS IV SOLN: 500.0000 mL | INTRAVENOUS | Status: DC | PRN

## 2017-09-09 MED ORDER — SODIUM CHLORIDE 0.9 % IV SOLN
5.0000 10*6.[IU] | Freq: Once | INTRAVENOUS | Status: AC
  Administered 2017-09-09: 5 10*6.[IU] via INTRAVENOUS
  Filled 2017-09-09: qty 5

## 2017-09-09 MED ORDER — SODIUM CHLORIDE FLUSH 0.9 % IV SOLN
INTRAVENOUS | Status: AC
  Filled 2017-09-09: qty 10

## 2017-09-09 MED ORDER — OXYCODONE-ACETAMINOPHEN 5-325 MG PO TABS: 2.0000 | ORAL_TABLET | ORAL | Status: DC | PRN

## 2017-09-09 MED ORDER — BUPIVACAINE HCL (PF) 0.25 % IJ SOLN
INTRAMUSCULAR | Status: DC | PRN
  Administered 2017-09-09 (×2): 5 mL via EPIDURAL

## 2017-09-09 MED ORDER — FLEET ENEMA 7-19 GM/118ML RE ENEM: 1.0000 | ENEMA | Freq: Every day | RECTAL | Status: DC | PRN

## 2017-09-09 MED ORDER — LACTATED RINGERS IV SOLN
INTRAVENOUS | Status: DC
  Administered 2017-09-09 (×3): via INTRAVENOUS

## 2017-09-09 MED ORDER — OXYTOCIN 40 UNITS IN LACTATED RINGERS INFUSION - SIMPLE MED
2.5000 [IU]/h | INTRAVENOUS | Status: DC
  Filled 2017-09-09: qty 1000

## 2017-09-09 MED ORDER — PENICILLIN G POT IN DEXTROSE 60000 UNIT/ML IV SOLN
3.0000 10*6.[IU] | INTRAVENOUS | Status: DC
  Administered 2017-09-09: 3 10*6.[IU] via INTRAVENOUS
  Filled 2017-09-09 (×8): qty 50

## 2017-09-09 MED ORDER — OXYTOCIN BOLUS FROM INFUSION
500.0000 mL | Freq: Once | INTRAVENOUS | Status: AC
  Administered 2017-09-10: 500 mL via INTRAVENOUS

## 2017-09-09 MED ORDER — MISOPROSTOL 50MCG HALF TABLET
50.0000 ug | ORAL_TABLET | Freq: Once | ORAL | Status: AC
  Administered 2017-09-09: 50 ug via ORAL
  Filled 2017-09-09: qty 1

## 2017-09-09 MED ORDER — LIDOCAINE HCL (PF) 1 % IJ SOLN
30.0000 mL | INTRAMUSCULAR | Status: DC | PRN
  Filled 2017-09-09: qty 30

## 2017-09-09 NOTE — Progress Notes (Signed)
   09/09/17 1628  Labor Management  Is pain due to contractions? Yes  Labor Response Talking/texting through contractions  Observed Emotional State Calm  Labor Pain Relief Comfort Measures  Labor Support/Comfort Measures Doula at bedside;Support person at bedside  Fetal Heart Rate A  Mode External  Baseline Rate (A) 135 bpm  Variability 6-25 BPM  Accelerations 15 x 15  Decelerations None  Uterine Activity  Mode Toco  Contraction Frequency (min) 1.5  Contraction Duration (sec) 50-60  Contraction Quality Mild  Resting Tone Palpated Relaxed  Willodean RosenthalM Lawhorn, CNM called & report given re: above.  No new orders rec'd at this time. CNM states she will be on unit soon & assess pt.

## 2017-09-09 NOTE — OB Triage Note (Signed)
Patient arrived to LDR 6 for Induction of Labor. Pt reports good fetal movement and some mild occasional contractions. Denies leaking of fluid or vaginal bleeding. Monitors applied and assessing. Discussed plan of care with pt. Pt verbalized understanding.

## 2017-09-09 NOTE — Progress Notes (Signed)
Discussed plan of care with CNM. Hold dose of cytotec at this time. Patient may ambulate, try position changes, and shower if she desires. Ok for intermittent monitoring permitted tracing remains category 1. NST hourly with 2 minute spot checks. Ok to saline lock IV while patient is in shower. Plan to recheck cervix in approx 1 hour and call midwife with exam findings.

## 2017-09-09 NOTE — Progress Notes (Signed)
Patient ID: Tamara Mills, female   DOB: [birth year], 27 y.o.   MRN: [id number]  Tamara Mills is a 27 y.o. G1P0 at 5077w0d by LMP admitted for induction of labor due to Gestational hypertension.  Subjective:  Pt doing well, breathing through contractions. Reports intermittent back pain and leakage of clear fluid. FOB and doula at bedside.   Denies difficulty breathing or respiratory distress, chest pain, vaginal bleeding, dysuria, and leg pain or swelling.   Objective:  Temp:  [97.8 F (36.6 C)-98.3 F (36.8 C)] 98.3 F (36.8 C) (03/01 1541) Pulse Rate:  [51-80] 51 (03/01 1702) Resp:  [16-18] 16 (03/01 1702) BP: (108-142)/(56-86) 135/77 (03/01 1702) SpO2:  [98 %-99 %] 99 % (03/01 0700) Weight:  [191 lb (86.6 kg)] 191 lb (86.6 kg) (03/01 0535)  Fetal Wellbeing:  Category I   UC:   regular, every two (2) to four (4) minutes, soft resting tone  SVE:   Dilation: 1 Effacement (%): 70 Station: -2 Exam by:: Darrold JunkerJ. Burgess RN   SROM clear, small amount  Labs: Lab Results  Component Value Date   WBC 8.1 09/09/2017   HGB 14.2 09/09/2017   HCT 41.5 09/09/2017   MCV 87.6 09/09/2017   PLT 204 09/09/2017    Assessment:  Tamara Mills is a 27 y.o. G1P0 at 1277w0d admitted for induction of labor due to gestational hypertension, Rh positive, GBS positive  FHR Category I  Plan:  Will start Penicillin for GBS prophylaxis, see orders.   Encouraged rest, ambulation, and position change.   Reviewed red flag symptoms and when to call.   Continue orders as written. Reassess as needed.   Gunnar BullaJenkins Michelle Jonah Gingras, CNM [facility], [facility] 09/09/2017, [phone]

## 2017-09-09 NOTE — Progress Notes (Signed)
Patient ID: Tamara Mills, female   DOB: [birth year], 27 y.o.   MRN: [id number]  Tamara Mills is a 27 y.o. G1P0 at 6427w0d by LMP admitted for induction of labor due to Gestational hypertension.  Subjective:  Doing well, no questions or concerns. Family members and Doula at bedside.   Denies difficulty breathing or respiratory distress, chest pain, dysuria, and leg pain or swelling.   Objective:  Temp:  [97.8 F (36.6 C)-98.5 F (36.9 C)] 98.2 F (36.8 C) (03/01 2105) Pulse Rate:  [51-80] 63 (03/01 2203) Resp:  [16-18] 16 (03/01 2148) BP: (108-148)/(56-86) 124/70 (03/01 2203) SpO2:  [98 %-100 %] 100 % (03/01 2134) Weight:  [191 lb (86.6 kg)] 191 lb (86.6 kg) (03/01 0535)  Fetal Wellbeing:  Category I  UC:   regular, every two (2) to five (5) minutes, soft resting tone  SVE:   Dilation: 1 Effacement (%): 60 Station: -2 Exam by:: jaton burgess  Labs: Lab Results  Component Value Date   WBC 8.1 09/09/2017   HGB 14.2 09/09/2017   HCT 41.5 09/09/2017   MCV 87.6 09/09/2017   PLT 204 09/09/2017    Assessment:  Tamara Mills is a 27 y.o. G1P0 at 827w0d being admitted for induction of labor due to gestational hypertension, Rh positive, GBS positive  FHR Category I  Plan:  Foley bulb in vaginal vault and removed. Unable to replace after multiple attempts.   Encouraged rest, ambulation, and position change.  Reviewed red flag symptoms and when to call.   Continue orders as written. Reassess as needed.   Gunnar BullaJenkins Michelle Charod Slawinski, CNM [facility], [facility] 09/09/2017, 1:34 PM

## 2017-09-09 NOTE — Plan of Care (Signed)
Discussed plan of care with patient who verbalized understanding and agreed to plan of care.

## 2017-09-09 NOTE — Progress Notes (Signed)
Patient ID: Tamara Mills, female   DOB: 1991/02/23, 27 y.o.   MRN: [id number]  Tamara MayerKira J Mills is a 27 y.o. G1P0 at 2053w0d by LMP admitted for induction of labor due to Gestational hypertension.  Subjective:  Doing well, endorses pain relief since epidural placement. Reports intermittent pelvic and vaginal pressure.   Denies difficulty breathing or respiratory distress, chest pain, abdominal pain, and leg pain or swelling.   Objective:  Temp:  [97.8 F (36.6 C)-98.5 F (36.9 C)] 98.2 F (36.8 C) (03/01 2105) Pulse Rate:  [51-80] 61 (03/01 2218) Resp:  [16-18] 16 (03/01 2148) BP: (108-148)/(56-86) 131/69 (03/01 2218) SpO2:  [98 %-100 %] 100 % (03/01 2300) Weight:  [191 lb (86.6 kg)] 191 lb (86.6 kg) (03/01 0535)  Fetal Wellbeing:  Category I  UC:   regular, every one (1) to three (3) minutes, soft resting tone  SVE:   Dilation: 10 Effacement (%): 100 Station: 0 [facility]:: [facility](Ronalee Scheunemann CNM)  Labs: Lab Results  Component Value Date   WBC 8.1 09/09/2017   HGB 14.2 09/09/2017   HCT 41.5 09/09/2017   MCV 87.6 09/09/2017   PLT 204 09/09/2017    Assessment:  Tamara AhmadiKira J Durris a 27 y.o.G1P0 at 3153w0d admitted for induction of labor due to gestational hypertension, Rh positive, GBS positive  FHR Category I  Plan:  Second dose of IV antibiotics infusing now, will labor down.   Reviewed red flag symptoms and when to call.   Room prepared for second stage.    Tamara Mills Tamara Mills, CNM [facility], [facility] 09/09/2017, 11:12 PM

## 2017-09-09 NOTE — Anesthesia Procedure Notes (Signed)
Epidural Patient location during procedure: OB Start time: 09/09/2017 9:21 PM End time: 09/09/2017 9:32 PM  Staffing Anesthesiologist: Lenard SimmerKarenz, Roger Hartl, MD Performed: anesthesiologist   Preanesthetic Checklist Completed: patient identified, site marked, surgical consent, pre-op evaluation, timeout performed, IV checked, risks and benefits discussed and monitors and equipment checked  Epidural Patient position: sitting Prep: ChloraPrep Patient monitoring: heart rate, continuous pulse ox and blood pressure Approach: midline Location: L3-L4 Injection technique: LOR saline  Needle:  Needle type: Tuohy  Needle gauge: 17 G Needle length: 9 cm and 9 Needle insertion depth: 6 cm Catheter type: closed end flexible Catheter size: 19 Gauge Catheter at skin depth: 10 cm Test dose: negative and 1.5% lidocaine with Epi 1:200 K  Assessment Sensory level: T10 Events: blood not aspirated, injection not painful, no injection resistance, negative IV test and no paresthesia  Additional Notes Pt. Evaluated and documentation done after procedure finished. Patient identified. Risks/Benefits/Options discussed with patient including but not limited to bleeding, infection, nerve damage, paralysis, failed block, incomplete pain control, headache, blood pressure changes, nausea, vomiting, reactions to medication both or allergic, itching and postpartum back pain. Confirmed with bedside nurse the patient's most recent platelet count. Confirmed with patient that they are not currently taking any anticoagulation, have any bleeding history or any family history of bleeding disorders. Patient expressed understanding and wished to proceed. All questions were answered. Sterile technique was used throughout the entire procedure. Please see nursing notes for vital signs. Test dose was given through epidural catheter and negative prior to continuing to dose epidural or start infusion. Warning signs of high block given to  the patient including shortness of breath, tingling/numbness in hands, complete motor block, or any concerning symptoms with instructions to call for help. Patient was given instructions on fall risk and not to get out of bed. All questions and concerns addressed with instructions to call with any issues or inadequate analgesia.   Patient tolerated the insertion well without immediate complications.Reason for block:procedure for pain

## 2017-09-09 NOTE — Anesthesia Preprocedure Evaluation (Signed)
Anesthesia Evaluation  Patient identified by MRN, date of birth, ID band Patient awake    Reviewed: Allergy & Precautions, H&P , NPO status , Patient's Chart, lab work & pertinent test results, reviewed documented beta blocker date and time   History of Anesthesia Complications Negative for: history of anesthetic complications  Airway Mallampati: II  TM Distance: >3 FB Neck ROM: full    Dental  (+) Dental Advidsory Given   Pulmonary neg pulmonary ROS,           Cardiovascular Exercise Tolerance: Good negative cardio ROS       Neuro/Psych PSYCHIATRIC DISORDERS Anxiety negative neurological ROS  negative psych ROS   GI/Hepatic Neg liver ROS, GERD  ,  Endo/Other  negative endocrine ROS  Renal/GU negative Renal ROS  negative genitourinary   Musculoskeletal   Abdominal   Peds  Hematology negative hematology ROS (+)   Anesthesia Other Findings Past Medical History: No date: Acne No date: Amenorrhea     Comment:  Hx of No date: Anxiety No date: Eating disorder     Comment:  ? eating disorder in past ( pt states just running too               much) No date: Hand eczema No date: Seasonal allergies No date: Underweight     Comment:  Hx of   Reproductive/Obstetrics (+) Pregnancy                             Anesthesia Physical Anesthesia Plan  ASA: II  Anesthesia Plan: Epidural   Post-op Pain Management:    Induction:   PONV Risk Score and Plan:   Airway Management Planned:   Additional Equipment:   Intra-op Plan:   Post-operative Plan:   Informed Consent: I have reviewed the patients History and Physical, chart, labs and discussed the procedure including the risks, benefits and alternatives for the proposed anesthesia with the patient or authorized representative who has indicated his/her understanding and acceptance.   Dental Advisory Given  Plan Discussed with:  Anesthesiologist, CRNA and Surgeon  Anesthesia Plan Comments:         Anesthesia Quick Evaluation

## 2017-09-09 NOTE — H&P (Addendum)
Obstetric History and Physical  Tamara MayerKira J Mills is a 27 y.o. G1P0 with IUP at 4247w0d presenting for induction of labor due to gestational hypertension. Patient states she has been having  Irregular contractions, none vaginal bleeding, intact membranes, with active fetal movement.    Denies difficulty breathing or respiratory distress, chest pain, abdominal pain, dysuria, and leg pain or swelling.   Prenatal Course  Source of Care: [facility]-initial visit: 9 wks, total visits: 12  Pregnancy complications or risks: gestational hypertension  Prenatal labs and studies:  ABO, Rh: --/--/O POS (03/01 0701)  Antibody: NEG (03/01 0701)  Rubella: 3.28 (08/06 1547)  Varicella: 2,482 (08/06 1547)  RPR: Non Reactive (12/14 0925)   HBsAg: Negative (08/06 1547)   HIV: Non Reactive (08/06 1547)   GBS: Positive (08/06 1618)  1 hr Glucola: 89 (12/14 0925)  Genetic screening: Declined  Anatomy US: Complete, normal (10/19 1249)  Past Medical History:  Diagnosis Date  . Acne   . Amenorrhea    Hx of  . Anxiety   . Eating disorder    ? eating disorder in past ( pt states just running too much)  . Hand eczema   . Seasonal allergies   . Underweight    Hx of    Past Surgical History:  Procedure Laterality Date  . WISDOM TOOTH EXTRACTION      OB History  Gravida Para Term Preterm AB Living  1            SAB TAB Ectopic Multiple Live Births               # Outcome Date GA Lbr Len/2nd Weight Sex Delivery Anes PTL Lv  1 Current               Social History   Socioeconomic History  . Marital status: Married    Spouse name: None  . Number of children: None  . Years of education: None  . Highest education level: None  Social Needs  . Financial resource strain: None  . Food insecurity - worry: None  . Food insecurity - inability: None  . Transportation needs - medical: None  . Transportation needs - non-medical: None  Occupational History  . Occupation: Pharmacist  Tobacco Use   . Smoking status: Never Smoker  . Smokeless tobacco: Never Used  Substance and Sexual Activity  . Alcohol use: Yes    Alcohol/week: 0.0 oz    Comment: occasional wine--not since pregnant  . Drug use: No  . Sexual activity: Yes    Partners: Male    Birth control/protection: OCP, None  Other Topics Concern  . None  Social History Narrative  . None    Family History  Problem Relation Age of Onset  . Crohn's disease Brother   . Ulcerative colitis Brother   . Sudden Cardiac Death Brother   . Hypertension Mother        had also preeclampsia  . Diabetes Maternal Grandmother        Type 1  . Cancer Paternal Grandmother 4060       breast  . Heart failure Paternal Grandfather        heart disease  . Cancer Paternal Aunt 1740       breast    Medications Prior to Admission  Medication Sig Dispense Refill Last Dose  . Omega-3 Fatty Acids (FISH OIL) 1200 MG CAPS    Past Week at Unknown time  . prenatal vitamin w/FE, FA (PRENATAL 1 +  1) 27-1 MG TABS tablet Take 1 tablet by mouth daily at 12 noon.   09/08/2017 at 1900    Allergies  Allergen Reactions  . Ortho Tri-Cyclen [Norgestimate-Eth Estradiol]     nausea  . Nickel Rash    Review of Systems: Negative except for what is mentioned in HPI.  Physical Exam:  Temp:  [98.3 F (36.8 C)] 98.3 F (36.8 C) (03/01 0535) Pulse Rate:  [55-80] 57 (03/01 0918) Resp:  [18] 18 (03/01 0535) BP: (108-148)/(56-86) 136/76 (03/01 0918) SpO2:  [98 %-99 %] 99 % (03/01 0700) Weight:  [191 lb (86.6 kg)-191 lb 3 oz (86.7 kg)] 191 lb (86.6 kg) (03/01 0535)  GENERAL: Well-developed, well-nourished female in no acute distress.   LUNGS: Clear to auscultation bilaterally.   HEART: Regular rate and rhythm.  ABDOMEN: Soft, nontender, nondistended, gravid.  EXTREMITIES: Nontender, no edema, 2+ distal pulses.  Cervical Exam: Deferred, completed by RN with cytotec placement  FHT:  Baseline rate 135 bpm   Variability moderate  Accelerations present    Decelerations none  Contractions: Occasional, soft resting tone   Pertinent Labs/Studies:    Results for orders placed or performed during the hospital encounter of 09/09/17 (from the past 24 hour(s))  CBC     Status: None   Collection Time: 09/09/17  6:14 AM  Result Value Ref Range   WBC 8.1 3.6 - 11.0 K/uL   RBC 4.74 3.80 - 5.20 MIL/uL   Hemoglobin 14.2 12.0 - 16.0 g/dL   HCT 40.9 81.1 - 91.4 %   MCV 87.6 80.0 - 100.0 fL   MCH 30.0 26.0 - 34.0 pg   MCHC 34.2 32.0 - 36.0 g/dL   RDW 78.2 95.6 - 21.3 %   Platelets 204 150 - 440 K/uL  Comprehensive metabolic panel     Status: Abnormal   Collection Time: 09/09/17  6:14 AM  Result Value Ref Range   Sodium 135 135 - 145 mmol/L   Potassium 3.8 3.5 - 5.1 mmol/L   Chloride 107 101 - 111 mmol/L   CO2 19 (L) 22 - 32 mmol/L   Glucose, Bld 88 65 - 99 mg/dL   BUN 12 6 - 20 mg/dL   Creatinine, Ser 0.86 (L) 0.44 - 1.00 mg/dL   Calcium 9.0 8.9 - 57.8 mg/dL   Total Protein 6.4 (L) 6.5 - 8.1 g/dL   Albumin 3.2 (L) 3.5 - 5.0 g/dL   AST 24 15 - 41 U/L   ALT 11 (L) 14 - 54 U/L   Alkaline Phosphatase 169 (H) 38 - 126 U/L   Total Bilirubin 0.7 0.3 - 1.2 mg/dL   GFR calc non Af Amer >60 >60 mL/min   GFR calc Af Amer >60 >60 mL/min   Anion gap 9 5 - 15  Protein / creatinine ratio, urine     Status: Abnormal   Collection Time: 09/09/17  6:14 AM  Result Value Ref Range   Creatinine, Urine 81 mg/dL   Total Protein, Urine 20 mg/dL   Protein Creatinine Ratio 0.25 (H) 0.00 - 0.15 mg/mg[Cre]  Type and screen     Status: None   Collection Time: 09/09/17  7:01 AM  Result Value Ref Range   ABO/RH(D) O POS    Antibody Screen NEG    Sample Expiration      09/12/2017 Performed at [facility], [address] [place], [place] [address]     Assessment :  Tamara Mills is a 27 y.o. G1P0 at [redacted]w[redacted]d  being admitted for induction of labor due to gestational hypertension, Rh positive, GBS positive  FHR Category I  Plan:  Admit to birthing  suites, see orders.   Foley bulb placed without difficulty.   Will start GBS prophylaxis in active labor, per patient request.   Reviewed red flag symptoms and when to call.    Gunnar Bulla, CNM [facility], [facility]

## 2017-09-09 NOTE — Progress Notes (Signed)
Pt instructed to inform RN if vag bleeding noted, sudden inc abdm pain or LOF. Pt verb understanding.

## 2017-09-10 DIAGNOSIS — O134 Gestational [pregnancy-induced] hypertension without significant proteinuria, complicating childbirth: Principal | ICD-10-CM

## 2017-09-10 DIAGNOSIS — Z3A39 39 weeks gestation of pregnancy: Secondary | ICD-10-CM

## 2017-09-10 LAB — CBC
HEMATOCRIT: 38 % (ref 35.0–47.0)
Hemoglobin: 12.9 g/dL (ref 12.0–16.0)
MCH: 29.8 pg (ref 26.0–34.0)
MCHC: 33.9 g/dL (ref 32.0–36.0)
MCV: 87.8 fL (ref 80.0–100.0)
Platelets: 178 10*3/uL (ref 150–440)
RBC: 4.33 MIL/uL (ref 3.80–5.20)
RDW: 14.5 % (ref 11.5–14.5)
WBC: 15.4 10*3/uL — AB (ref 3.6–11.0)

## 2017-09-10 LAB — COMPREHENSIVE METABOLIC PANEL
ALT: 10 U/L — AB (ref 14–54)
AST: 28 U/L (ref 15–41)
Albumin: 2.7 g/dL — ABNORMAL LOW (ref 3.5–5.0)
Alkaline Phosphatase: 131 U/L — ABNORMAL HIGH (ref 38–126)
Anion gap: 8 (ref 5–15)
BILIRUBIN TOTAL: 1 mg/dL (ref 0.3–1.2)
BUN: 9 mg/dL (ref 6–20)
CO2: 19 mmol/L — ABNORMAL LOW (ref 22–32)
CREATININE: 0.59 mg/dL (ref 0.44–1.00)
Calcium: 8.6 mg/dL — ABNORMAL LOW (ref 8.9–10.3)
Chloride: 108 mmol/L (ref 101–111)
GFR calc Af Amer: >60 mL/min (ref >60)
Glucose, Bld: 133 mg/dL — ABNORMAL HIGH (ref 65–99)
Potassium: 3.6 mmol/L (ref 3.5–5.1)
Sodium: 135 mmol/L (ref 135–145)
TOTAL PROTEIN: 5.4 g/dL — AB (ref 6.5–8.1)

## 2017-09-10 LAB — RPR: RPR Ser Ql: NONREACTIVE

## 2017-09-10 MED ORDER — BENZOCAINE-MENTHOL 20-0.5 % EX AERO: 1.0000 "application " | INHALATION_SPRAY | CUTANEOUS | Status: DC | PRN

## 2017-09-10 MED ORDER — SIMETHICONE 80 MG PO CHEW: 80.0000 mg | CHEWABLE_TABLET | ORAL | Status: DC | PRN

## 2017-09-10 MED ORDER — IBUPROFEN 600 MG PO TABS
600.0000 mg | ORAL_TABLET | Freq: Four times a day (QID) | ORAL | Status: DC
  Administered 2017-09-10 – 2017-09-11 (×6): 600 mg via ORAL
  Filled 2017-09-10 (×6): qty 1

## 2017-09-10 MED ORDER — COCONUT OIL OIL: 1.0000 "application " | TOPICAL_OIL | Status: DC | PRN

## 2017-09-10 MED ORDER — ACETAMINOPHEN 325 MG PO TABS: 650.0000 mg | ORAL_TABLET | ORAL | Status: DC | PRN

## 2017-09-10 MED ORDER — ZOLPIDEM TARTRATE 5 MG PO TABS: 5.0000 mg | ORAL_TABLET | Freq: Every evening | ORAL | Status: DC | PRN

## 2017-09-10 MED ORDER — DIBUCAINE 1 % RE OINT: 1.0000 "application " | TOPICAL_OINTMENT | RECTAL | Status: DC | PRN

## 2017-09-10 MED ORDER — ONDANSETRON HCL 4 MG PO TABS: 4.0000 mg | ORAL_TABLET | ORAL | Status: DC | PRN

## 2017-09-10 MED ORDER — PRENATAL MULTIVITAMIN CH
1.0000 | ORAL_TABLET | Freq: Every day | ORAL | Status: DC
  Administered 2017-09-10 – 2017-09-11 (×2): 1 via ORAL
  Filled 2017-09-10 (×2): qty 1

## 2017-09-10 MED ORDER — DIPHENHYDRAMINE HCL 25 MG PO CAPS: 25.0000 mg | ORAL_CAPSULE | Freq: Four times a day (QID) | ORAL | Status: DC | PRN

## 2017-09-10 MED ORDER — ONDANSETRON HCL 4 MG/2ML IJ SOLN: 4.0000 mg | INTRAMUSCULAR | Status: DC | PRN

## 2017-09-10 MED ORDER — WITCH HAZEL-GLYCERIN EX PADS: 1.0000 "application " | MEDICATED_PAD | CUTANEOUS | Status: DC | PRN

## 2017-09-10 MED ORDER — SENNOSIDES-DOCUSATE SODIUM 8.6-50 MG PO TABS
2.0000 | ORAL_TABLET | ORAL | Status: DC
  Administered 2017-09-11: 2 via ORAL
  Filled 2017-09-10: qty 2

## 2017-09-11 MED ORDER — IBUPROFEN 600 MG PO TABS: 600.0000 mg | ORAL_TABLET | Freq: Four times a day (QID) | ORAL | 0 refills | Status: DC

## 2017-09-11 NOTE — Anesthesia Postprocedure Evaluation (Signed)
Anesthesia Post Note  Patient: Tamara AhmadiKira J Mills  Procedure(s) Performed: AN AD HOC LABOR EPIDURAL  Comments: Pt discharged prior to being seen. No complications reproted.     Last Vitals: There were no vitals filed for this visit.  Last Pain: There were no vitals filed for this visit.               Zylpha Poynor K

## 2017-09-11 NOTE — Progress Notes (Signed)
ROB-Doing well, no questions. Has been matched with volunteer doula, Natalia LeatherwoodKatherine. Reviewed red flag symptoms and when to call. RTC x 1 week for ROB or sooner if needed.

## 2017-09-11 NOTE — Discharge Summary (Signed)
  Obstetric Discharge Summary  Patient ID: Tamara MayerKira J Weed MRN: [id number] DOB/AGE: [birth year] 27 y.o.   Date of Admission: 09/09/2017 Tamara RoyalsMichelle Reise Gladney, CNM [facility](D. Evans, MD)  Date of Discharge: 09/11/2017 Tamara RoyalsMichelle Cheril Slattery, CNM [facility](D. Evans, MD)  Admitting Diagnosis: Induction of labor at 7239w1d  Secondary Diagnosis: Gestational hypertension  Mode of Delivery: normal spontaneous vaginal delivery     Discharge Diagnosis: No other diagnosis   Intrapartum Procedures: GBS prophylaxis and pitocin augmentation   Post partum procedures: None  Complications: Bilateral periurethral  laceration   Brief Hospital Course   Tamara Mills is a G1P1001 who had a SVD on 09/10/2017;  for further details of this birth, please refer to the delivey note.  Patient had an uncomplicated postpartum course.  By time of discharge on PPD#1, her pain was controlled on oral pain medications; she had appropriate lochia and was ambulating, voiding without difficulty and tolerating regular diet.  She was deemed stable for discharge to home.    Labs:  CBC Latest Ref Rng & Units 09/10/2017 09/09/2017 08/26/2017  WBC 3.6 - 11.0 K/uL 15.4(H) 8.1 7.8  Hemoglobin 12.0 - 16.0 g/dL 62.912.9 52.814.2 41.314.7  Hematocrit 35.0 - 47.0 % 38.0 41.5 43.8  Platelets 150 - 440 K/uL 178 204 214   O POS  Physical exam:   Temp:  [97.5 F (36.4 C)-99 F (37.2 C)] 97.9 F (36.6 C) (03/03 0859) Pulse Rate:  [55-67] 60 (03/03 0859) Resp:  [18-20] 20 (03/03 0859) BP: (127-140)/(70-80) 136/79 (03/03 0859) SpO2:  [97 %-100 %] 99 % (03/03 0859)  General: alert and no distress  Lochia: appropriate  Abdomen: soft, NT  Uterine Fundus: firm  Extremities: No evidence of DVT seen on physical exam. No lower extremity edema.  Discharge Instructions: Per After Visit Summary.  Activity: Advance as tolerated. Pelvic rest for 6 weeks.  Also refer to After Visit Summary  Diet: Regular  Medications:  Allergies as of 09/11/2017      Reactions   Ortho  Tri-cyclen [norgestimate-eth Estradiol]    nausea   Nickel Rash      Medication List    TAKE these medications   Fish Oil 1200 MG Caps   ibuprofen 600 MG tablet Commonly known as:  ADVIL,MOTRIN Take 1 tablet (600 mg total) by mouth every 6 (six) hours.   prenatal vitamin w/FE, FA 27-1 MG Tabs tablet Take 1 tablet by mouth daily at 12 noon.      Outpatient follow up:  Follow-up Information    Gunnar BullaLawhorn, Desteni Piscopo Michelle, CNM. Call.   Specialties:  Certified Nurse Midwife, Obstetrics and Gynecology, Radiology Why:  Please call to schedule six (6) week PPV with Tamara RoyalsMichelle Mckenzey Parcell, CNM Contact information: [address] [place] [address] [phone]          Postpartum contraception: abstinence  Discharged Condition: stable  Discharged to: home   Newborn Data:  Disposition:home with mother  Apgars: APGAR (1 MIN): 8   APGAR (5 MINS): 9     Baby Feeding: Breast   Gunnar BullaJenkins Michelle Daysie Helf, CNM [facility], [facility]

## 2017-09-11 NOTE — Progress Notes (Signed)
Patient ID: Tamara Mills, female   DOB: 1990/10/03, 27 y.o.   MRN: [id number]  Post Partum Day # 1, s/p SVD  Subjective:  Doing well, desires discharge home today. Has infant feeding plan.   Denies difficulty breathing or respiratory distress, chest pain, abdominal pain, excessive vaginal bleeding, dysuria, and leg pain or swelling.   Objective:  Temp:  [97.5 F (36.4 C)-99 F (37.2 C)] 97.9 F (36.6 C) (03/03 0859) Pulse Rate:  [55-67] 60 (03/03 0859) Resp:  [18-20] 20 (03/03 0859) BP: (127-140)/(70-80) 136/79 (03/03 0859) SpO2:  [97 %-100 %] 99 % (03/03 0859)  Physical Exam:   General: alert and cooperative   Lungs: clear to auscultation bilaterally  Breasts: deferred, no complaints  Heart: regular rate and rhythm, S1, S2 normal, no murmur, click, rub or gallop  Abdomen: soft, non-tender; bowel sounds normal; no masses,  no organomegaly  Pelvis: Lochia: appropriate, Uterine  Fundus: firm  Extremities: DVT Evaluation: No evidence of DVT seen on physical exam.  Recent Labs    09/09/17 0614 09/10/17 0601  HGB 14.2 12.9  HCT 41.5 38.0    Assessment/Plan: Discharge home and Lactation consult   LOS: 2 days    Gunnar BullaJenkins Michelle Jahmya Onofrio, CNM [facility], [facility] 09/11/2017 10:08 AM

## 2017-09-11 NOTE — Progress Notes (Signed)
Discharge instructions given. Patient verbalizes understanding of teaching. Patient discharged home at 13:35.

## 2017-09-15 ENCOUNTER — Ambulatory Visit: Payer: Self-pay

## 2017-09-15 NOTE — Lactation Note (Signed)
This note was copied from a baby's chart. Lactation Consultation Note  Patient Name: Tamara Mills ZOXWR'UToday's Date: 09/15/2017     Maternal Data  Mom is anxious about breastfeeding and wants to know if baby is getting enough per feedings. Mom also states that baby slides off breast when nursing. Parents state that they have been nursing or pumping almost every hour and mom's breasts continue to become fuller faster.   Feeding  [name] ate for 8min prior to consult and stayed on mom's rt breast for 10min and took in 20mL (mom fed from same side). Adequate milk transfer was accomplished.   LATCH Score  9   Baby ate with clothes on    Mom didn't need much help with positioning.   Baby latched on with the help of a size 16mm nipple shield        Interventions  16mm nipple shield  Lactation Tools Discussed/Used  DEBP using 24mm flanges instead of 28mm for Spectra 1 16mm nipple shield handsfree bra  Consult Status  Baby and mom are establishing a good breastfeeding relationship. Mom understands that removing milk too frequently can cause engorgement and mastitis. Mom has been instructed to feed baby from one breast and alternate. IF baby is still hungry after feeding from one breast mom can offer the other, but if baby is satiated, mom can hand express to comfort. Mom has been encouraged to attend support group meetings.     Burnadette PeterJaniya M Cacie Gaskins 09/15/2017, 2:45 PM

## 2017-09-18 ENCOUNTER — Encounter: Payer: Self-pay | Admitting: Family Medicine

## 2017-09-19 ENCOUNTER — Encounter: Payer: Self-pay | Admitting: Certified Nurse Midwife

## 2017-09-20 ENCOUNTER — Telehealth: Payer: Self-pay | Admitting: Certified Nurse Midwife

## 2017-09-20 NOTE — Telephone Encounter (Signed)
Pt states this am she woke up with a fever if 101.5 and  feeling bad. She has had red streaks on her breast. She has taken ibup and feeling better now. No fever at the moment. Advised pt to take ibup 800 q8 and tylenol 1000 q 6. Push fluids. Appt made to see mad tomorrow at 2:15.

## 2017-09-20 NOTE — Telephone Encounter (Signed)
The patient called and stated that she thinks she may have "Mastitis" The patient would like to speak with a nurse as soon as possible this morning to express her concerns. Please advise.

## 2017-09-21 ENCOUNTER — Encounter: Payer: Managed Care, Other (non HMO) | Admitting: Obstetrics and Gynecology

## 2017-09-21 ENCOUNTER — Ambulatory Visit (INDEPENDENT_AMBULATORY_CARE_PROVIDER_SITE_OTHER): Payer: Managed Care, Other (non HMO) | Admitting: Obstetrics and Gynecology

## 2017-09-21 ENCOUNTER — Encounter: Payer: Self-pay | Admitting: Obstetrics and Gynecology

## 2017-09-21 VITALS — BP 136/80 | HR 67 | Temp 98.2°F | Ht 66.0 in | Wt 170.6 lb

## 2017-09-21 DIAGNOSIS — N61 Mastitis without abscess: Secondary | ICD-10-CM | POA: Diagnosis not present

## 2017-09-21 MED ORDER — DICLOXACILLIN SODIUM 500 MG PO CAPS
500.0000 mg | ORAL_CAPSULE | Freq: Four times a day (QID) | ORAL | 0 refills | Status: DC
Start: 2017-09-21 — End: 2017-10-05

## 2017-09-21 NOTE — Progress Notes (Signed)
Chief complaint: 1.  Mastitis  Patient presents today for evaluation of 4-day history of right breast pain with fever to 101.6 noted during breast-feeding.  Breast-feeding has been difficult because of flat nipples and difficulty with technique to the point where the patient is pumping her breasts at this time.  She has had some fever and chills as noted along with some aches and pains.  Patient reports no significant drug allergies.  Past Medical History:  Diagnosis Date  . Acne   . Amenorrhea    Hx of  . Anxiety   . Eating disorder    ? eating disorder in past ( pt states just running too much)  . Hand eczema   . Seasonal allergies   . Underweight    Hx of   Past Surgical History:  Procedure Laterality Date  . WISDOM TOOTH EXTRACTION      Review of systems: Comprehensive review of systems is positive for that noted in the HPI  OBJECTIVE: BP 136/80   Pulse 67   Temp 98.2 F (36.8 C)   Ht 5\' 6"  (1.676 m)   Wt 170 lb 9.6 oz (77.4 kg)   Breastfeeding? Yes   BMI 27.54 kg/m  Pleasant well-appearing female in no acute distress.  Alert and oriented. Neck: Supple without thyromegaly or adenopathy Lymph node survey: No supraclavicular adenopathy; no axillary adenopathy Breast: Lactation changes noted; nipples are flat; right nipple slightly ulcerated at the 9 o'clock position; right breast notable for erythema from the 12:00 to the 4 o'clock position; no palpable mass/abscess; mild to moderate tenderness.  ASSESSMENT: 1.  Acute right mastitis  PLAN: 1.  Increase p.o. fluid intake 2.  Continue to nurse/pump 3.  Tylenol/ibuprofen as needed for pain relief and fever 4.  Dicloxacillin 500 mg 4 times a day for 14 days 5.  Return in 2 weeks for follow-up  A total of 15 minutes were spent face-to-face with the patient during this encounter and over half of that time dealt with counseling and coordination of care.  Herold HarmsMartin A Defrancesco, MD  Note: This dictation was prepared  with Dragon dictation along with smaller phrase technology. Any transcriptional errors that result from this process are unintentional.

## 2017-09-21 NOTE — Patient Instructions (Signed)
1.  Continue to nurse/pump 2.  Increase p.o. fluid intake 3.  Tylenol/ibuprofen as needed for pain 4.  Dicloxacillin 500 mg 4 times a day for 14 days 5.  Return in 2 weeks for follow-up   Mastitis Mastitis is inflammation of the breast tissue. It occurs most often in women who are breastfeeding, but it can also affect other women, and even sometimes men. What are the causes? Mastitis is usually caused by a bacterial infection. Bacteria enter the breast tissue through cuts or openings in the skin. Typically, this occurs with breastfeeding because of cracked or irritated skin. Sometimes, it can occur even when there is no opening in the skin. It can be associated with plugged milk (lactiferous) ducts. Nipple piercing can also lead to mastitis. Also, some forms of breast cancer can cause mastitis. What are the signs or symptoms?  Swelling, redness, tenderness, and pain in an area of the breast.  Swelling of the glands under the arm on the same side.  Fever. If an infection is allowed to progress, a collection of pus (abscess) may develop. How is this diagnosed? Your health care provider can usually diagnose mastitis based on your symptoms and a physical exam. Tests may be done to help confirm the diagnosis. These may include:  Removal of pus from the breast by applying pressure to the area. This pus can be examined in the lab to determine which bacteria are present. If an abscess has developed, the fluid in the abscess can be removed with a needle. This can also be used to confirm the diagnosis and determine the bacteria present. In most cases, pus will not be present.  Blood tests to determine if your body is fighting a bacterial infection.  Mammogram or ultrasound tests to rule out other problems or diseases.  How is this treated? Antibiotic medicine is used to treat a bacterial infection. Your health care provider will determine which bacteria are most likely causing the infection and  will select an appropriate antibiotic. This is sometimes changed based on the results of tests performed to identify the bacteria, or if there is no response to the antibiotic selected. Antibiotics are usually given by mouth. You may also be given medicine for pain. Mastitis that occurs with breastfeeding will sometimes go away on its own, so your health care provider may choose to wait 24 hours after first seeing you to decide whether a prescription medicine is needed. Follow these instructions at home:  Only take over-the-counter or prescription medicines for pain, fever, or discomfort as directed by your health care provider.  If your health care provider prescribed an antibiotic, take the medicine as directed. Make sure you finish it even if you start to feel better.  Do not wear a tight or underwire bra. Wear a soft, supportive bra.  Increase your fluid intake, especially if you have a fever.  Women who are breastfeeding should follow these instructions: ? Continue to empty the breast. Your health care provider can tell you whether this milk is safe for your infant or needs to be thrown out. You may be told to stop nursing until your health care provider thinks it is safe for your baby. Use a breast pump if you are advised to stop nursing. ? Keep your nipples clean and dry. ? Empty the first breast completely before going to the other breast. If your baby is not emptying your breasts completely for some reason, use a breast pump to empty your breasts. ? If  you go back to work, pump your breasts while at work to stay in time with your nursing schedule. ? Avoid allowing your breasts to become overly filled with milk (engorged). Contact a health care provider if:  You have pus-like discharge from the breast.  Your symptoms do not improve with the treatment prescribed by your health care provider within 2 days. Get help right away if:  Your pain and swelling are getting worse.  You have  pain that is not controlled with medicine.  You have a red line extending from the breast toward your armpit.  You have a fever or persistent symptoms for more than 2-3 days.  You have a fever and your symptoms suddenly get worse. This information is not intended to replace advice given to you by your health care provider. Make sure you discuss any questions you have with your health care provider. Document Released: 06/28/2005 Document Revised: 12/04/2015 Document Reviewed: 01/26/2013 Elsevier Interactive Patient Education  2017 [employer].

## 2017-10-05 ENCOUNTER — Ambulatory Visit (INDEPENDENT_AMBULATORY_CARE_PROVIDER_SITE_OTHER): Payer: Managed Care, Other (non HMO) | Admitting: Obstetrics and Gynecology

## 2017-10-05 ENCOUNTER — Encounter: Payer: Self-pay | Admitting: Obstetrics and Gynecology

## 2017-10-05 VITALS — BP 147/76 | HR 64 | Ht 66.0 in | Wt 170.7 lb

## 2017-10-05 DIAGNOSIS — N61 Mastitis without abscess: Secondary | ICD-10-CM | POA: Diagnosis not present

## 2017-10-05 DIAGNOSIS — O133 Gestational [pregnancy-induced] hypertension without significant proteinuria, third trimester: Secondary | ICD-10-CM | POA: Diagnosis not present

## 2017-10-05 NOTE — Patient Instructions (Signed)
1.  Return in 3 weeks for 6-week postpartum check 2.  Gestational hypertension changes should resolve by 6 weeks; if not resolved at that time, diagnosis of chronic hypertension may be made 3.  Mastitis is resolved.  If pumping breasts only, risk for recurrent mastitis is low.

## 2017-10-05 NOTE — Progress Notes (Signed)
Chief complaint: 1.  Right mastitis-follow-up 2.  Gestational hypertension  Presents today for 3-week follow-up after completing a 2-week course of dicloxacillin antibiotics for right breast mastitis.  She is not having any breast pain, fevers, or myalgias.  She continues to pump her breasts; she has decided to discontinue breast-feeding because of difficulties due to anatomic restrictions with flat nipples and the recent mastitis.  Tamara PanderCara does have some persistently elevated blood pressures since delivery; pregnancy was complicated by gestational hypertension.  She does not report any shortness of breath, chest pain, headaches.  OBJECTIVE: BP (!) 147/76   Pulse 64   Ht 5\' 6"  (1.676 m)   Wt 170 lb 11.2 oz (77.4 kg)   Breastfeeding? Yes   BMI 27.55 kg/m  Pleasant female in no acute distress.  Alert and oriented. Neck: Supple without thyromegaly or adenopathy Breasts: Bilateral symmetry; previously noted hyperemia and heat of the upper inner quadrant of the right breast has resolved.  No fluctuance or mass.  No significant tenderness of the breast.  ASSESSMENT: 1.  Right mastitis, resolved 2.  Gestational hypertension with some persistent elevated postpartum BPs  PLAN: 1.  Return in 3 weeks for 6-week postpartum check and reassessment for evidence of persistent hypertension which may be consistent with chronic hypertension 2.  Patient is aware of future signs and symptoms of recurrent mastitis.  Not likely to occur if patient is no longer breast-feeding and only pumping.  A total of 15 minutes were spent face-to-face with the patient during this encounter and over half of that time dealt with counseling and coordination of care.  Herold HarmsMartin A Liberta Gimpel, MD  Note: This dictation was prepared with Dragon dictation along with smaller phrase technology. Any transcriptional errors that result from this process are unintentional.

## 2017-10-14 ENCOUNTER — Ambulatory Visit (INDEPENDENT_AMBULATORY_CARE_PROVIDER_SITE_OTHER): Payer: Managed Care, Other (non HMO) | Admitting: *Deleted

## 2017-10-14 DIAGNOSIS — Z111 Encounter for screening for respiratory tuberculosis: Secondary | ICD-10-CM

## 2017-10-15 ENCOUNTER — Encounter: Payer: Self-pay | Admitting: Family Medicine

## 2017-10-17 ENCOUNTER — Encounter: Payer: Self-pay | Admitting: Family Medicine

## 2017-10-17 LAB — TB SKIN TEST
Induration: 0 mm
TB SKIN TEST: NEGATIVE

## 2017-10-17 MED ORDER — VILAZODONE HCL 10 & 20 & 40 MG PO KIT: PACK | ORAL | 0 refills | Status: DC

## 2017-10-17 NOTE — Telephone Encounter (Signed)
viibryd starter pack

## 2017-10-18 ENCOUNTER — Ambulatory Visit (INDEPENDENT_AMBULATORY_CARE_PROVIDER_SITE_OTHER): Payer: Managed Care, Other (non HMO) | Admitting: Certified Nurse Midwife

## 2017-10-18 NOTE — Progress Notes (Signed)
Pt is here for a post partum visit. Also states she has some left breast issues. Bottle feeding. Would like to start Yaz. Has not had a period. Has not resumed intercourse. Screening 0

## 2017-10-18 NOTE — Patient Instructions (Signed)
Preventive Care 18-39 Years, Female Preventive care refers to lifestyle choices and visits with your health care provider that can promote health and wellness. What does preventive care include?  A yearly physical exam. This is also called an annual well check.  Dental exams once or twice a year.  Routine eye exams. Ask your health care provider how often you should have your eyes checked.  Personal lifestyle choices, including: ? Daily care of your teeth and gums. ? Regular physical activity. ? Eating a healthy diet. ? Avoiding tobacco and drug use. ? Limiting alcohol use. ? Practicing safe sex. ? Taking vitamin and mineral supplements as recommended by your health care provider. What happens during an annual well check? The services and screenings done by your health care provider during your annual well check will depend on your age, overall health, lifestyle risk factors, and family history of disease. Counseling Your health care provider may ask you questions about your:  Alcohol use.  Tobacco use.  Drug use.  Emotional well-being.  Home and relationship well-being.  Sexual activity.  Eating habits.  Work and work Statistician.  Method of birth control.  Menstrual cycle.  Pregnancy history.  Screening You may have the following tests or measurements:  Height, weight, and BMI.  Diabetes screening. This is done by checking your blood sugar (glucose) after you have not eaten for a while (fasting).  Blood pressure.  Lipid and cholesterol levels. These may be checked every 5 years starting at age 66.  Skin check.  Hepatitis C blood test.  Hepatitis B blood test.  Sexually transmitted disease (STD) testing.  BRCA-related cancer screening. This may be done if you have a family history of breast, ovarian, tubal, or peritoneal cancers.  Pelvic exam and Pap test. This may be done every 3 years starting at age 40. Starting at age 59, this may be done every 5  years if you have a Pap test in combination with an HPV test.  Discuss your test results, treatment options, and if necessary, the need for more tests with your health care provider. Vaccines Your health care provider may recommend certain vaccines, such as:  Influenza vaccine. This is recommended every year.  Tetanus, diphtheria, and acellular pertussis (Tdap, Td) vaccine. You may need a Td booster every 10 years.  Varicella vaccine. You may need this if you have not been vaccinated.  HPV vaccine. If you are 69 or younger, you may need three doses over 6 months.  Measles, mumps, and rubella (MMR) vaccine. You may need at least one dose of MMR. You may also need a second dose.  Pneumococcal 13-valent conjugate (PCV13) vaccine. You may need this if you have certain conditions and were not previously vaccinated.  Pneumococcal polysaccharide (PPSV23) vaccine. You may need one or two doses if you smoke cigarettes or if you have certain conditions.  Meningococcal vaccine. One dose is recommended if you are age 27-21 years and a first-year college student living in a residence hall, or if you have one of several medical conditions. You may also need additional booster doses.  Hepatitis A vaccine. You may need this if you have certain conditions or if you travel or work in places where you may be exposed to hepatitis A.  Hepatitis B vaccine. You may need this if you have certain conditions or if you travel or work in places where you may be exposed to hepatitis B.  Haemophilus influenzae type b (Hib) vaccine. You may need this if  you have certain risk factors.  Talk to your health care provider about which screenings and vaccines you need and how often you need them. This information is not intended to replace advice given to you by your health care provider. Make sure you discuss any questions you have with your health care provider. Document Released: 08/24/2001 Document Revised: 03/17/2016  Document Reviewed: 04/29/2015 Elsevier Interactive Patient Education  [employer].

## 2017-10-18 NOTE — Progress Notes (Signed)
Subjective:    Verlene MayerKira J Henken is a 27 y.o. [id number] Caucasian female who presents for a postpartum visit. She is 6 weeks postpartum following a spontaneous vaginal delivery at 39+1 gestational weeks. Anesthesia: epidural. I have fully reviewed the prenatal and intrapartum course.  Postpartum course has been complicated by mastitis. Baby's course has been uncomplicated. Baby is feeding by formula. Bleeding no bleeding. Bowel function is normal. Bladder function is normal.   Patient is not sexually active. Contraception method is abstinence. Postpartum depression screening: negative. Score 0.  Last pap 2016 and was negative.  Denies difficulty breathing or respiratory distress, chest pain, abdominal pain, excessive vaginal bleeding, dysuria, and leg pain or swelling.   The following portions of the patient's history were reviewed and updated as appropriate: allergies, current medications, past medical history, past surgical history and problem list.  Review of Systems  Pertinent items are noted in HPI.   Objective:   BP 118/82   Pulse 76   Ht 5\' 6"  (1.676 m)   Wt 163 lb 9 oz (74.2 kg)   Breastfeeding? No   BMI 26.40 kg/m   General:  alert, cooperative and no distress   Breasts:  Right breast WNL, left breast healing  Lungs: clear to auscultation bilaterally  Heart:  regular rate and rhythm  Abdomen: soft, nontender   Vulva: normal  Vagina: normal vagina  Cervix:  closed  Corpus: Well-involuted  Adnexa:  Non-palpable    Depression screen PHQ 2/9 10/18/2017  Decreased Interest 0  Down, Depressed, Hopeless 0  PHQ - 2 Score 0  Altered sleeping 0  Tired, decreased energy 0  Change in appetite 0  Feeling bad or failure about yourself  0  Trouble concentrating 0  Moving slowly or fidgety/restless 0  Suicidal thoughts 0  PHQ-9 Score 0      Assessment:   Postpartum exam Six (6) wks s/p spontaneous vaginal delivery Formula feeding Depression screening Contraception counseling    Plan:   May return to work without restriction.   Rx: Yaz, see orders.   Reviewed red flag symptoms and when to call.   Follow up in: 4-6 months for Annual Exam or earlier if needed   Gunnar BullaJenkins Michelle Koi Yarbro, CNM [facility], [facility]

## 2017-10-19 MED ORDER — BUPROPION HCL ER (XL) 150 MG PO TB24: 150.0000 mg | ORAL_TABLET | Freq: Every day | ORAL | 11 refills | Status: DC

## 2017-10-19 MED ORDER — DROSPIRENONE-ETHINYL ESTRADIOL 3-0.02 MG PO TABS: 1.0000 | ORAL_TABLET | Freq: Every day | ORAL | 4 refills | Status: DC

## 2017-10-19 NOTE — Addendum Note (Signed)
Addended by: Shaune SpittleLAWHORN, Staisha Winiarski M on: 10/19/2017 07:11 AM   Modules accepted: Orders

## 2017-10-19 NOTE — Telephone Encounter (Signed)
Sent wellbutrin 

## 2017-10-20 ENCOUNTER — Other Ambulatory Visit: Payer: Self-pay

## 2017-10-20 MED ORDER — DROSPIRENONE-ETHINYL ESTRADIOL 3-0.03 MG PO TABS: 1.0000 | ORAL_TABLET | Freq: Every day | ORAL | 11 refills | Status: DC

## 2017-11-04 ENCOUNTER — Ambulatory Visit (INDEPENDENT_AMBULATORY_CARE_PROVIDER_SITE_OTHER): Payer: Managed Care, Other (non HMO) | Admitting: Family Medicine

## 2017-11-04 ENCOUNTER — Encounter: Payer: Self-pay | Admitting: Family Medicine

## 2017-11-04 VITALS — BP 142/84 | HR 63 | Temp 98.1°F | Ht 66.0 in | Wt 160.5 lb

## 2017-11-04 DIAGNOSIS — F419 Anxiety disorder, unspecified: Secondary | ICD-10-CM

## 2017-11-04 DIAGNOSIS — I10 Essential (primary) hypertension: Secondary | ICD-10-CM | POA: Diagnosis not present

## 2017-11-04 MED ORDER — LISINOPRIL 5 MG PO TABS: 5.0000 mg | ORAL_TABLET | Freq: Every day | ORAL | 11 refills | Status: DC

## 2017-11-04 NOTE — Assessment & Plan Note (Addendum)
bp is mildly elevated since pregnancy (with a strong fam hx) Rev gyn notes BP Readings from Last 1 Encounters:  11/04/17 (!) 142/84   No changes needed Disc lifstyle change with low sodium diet and exercise  Last lab and EKG reviewed  Strong family hx  Will try lisinopril very low dose 5 mg  Alert if side eff like hypotension or cough F/u in approx 6 wk for visit and labs  Would need to change to beta blocker if she wants to become pregnant again

## 2017-11-04 NOTE — Assessment & Plan Note (Signed)
Overall improved after stopping breast feeding (stressful and did not work out)  However still struggling somewhat No imp on wellbutrin Would like to try Vilazodone - has a coupon (is $$)  Will try starter pack and see how tolerated Discussed expectations of this medication including time to effectiveness and mechanism of action, also poss of side effects (early and late)- including mental fuzziness, weight or appetite change, nausea and poss of worse dep or anxiety (even suicidal thoughts)  Pt voiced understanding and will stop med and update if this occurs   Enc to work on self care Continue walks daily and outdoor time  F/u 6 weeks or earlier if needed

## 2017-11-04 NOTE — Patient Instructions (Addendum)
Try the lisinopril low dose 5 mg and keep an eye on blood pressure  Keep walking   Watch the sodium Drink lots of water   Follow up in about 6 weeks   Take care of yourself

## 2017-11-04 NOTE — Progress Notes (Signed)
Subjective:    Patient ID: Tamara Mills, female    DOB: 1991-03-09, 27 y.o.   MRN: [id number]  HPI Here for elevated blood pressure check   Recently had a baby   Was elevated 3/27 at gyn visit for mastitis  Noted she had gestational HTN and some persistantly elevated postpartum bp Does not think the check on 4/9 and it was not accurate   BP Readings from Last 3 Encounters:  11/04/17 (!) 142/84  10/18/17 118/82  10/05/17 (!) 147/76   Pulse Readings from Last 3 Encounters:  11/04/17 63  10/18/17 76  10/05/17 64   She tried wellbutrin for anx-did not help  Trying vilazodone now (has a coupon) -(actually starting today)  Overall anxiety is not as bad as it was now that she is not breast feeding  Stressors are expected with a new baby    Staying 140s/90s for the most part at home   Almost back to pre preg weight  Wt Readings from Last 3 Encounters:  11/04/17 160 lb 8 oz (72.8 kg)  10/18/17 163 lb 9 oz (74.2 kg)  10/05/17 170 lb 11.2 oz (77.4 kg)   25.91 kg/m  Going for hour long walks every day - getting out is really helping  Diet is fair -needs more fruit/veg  A little more processed food- tends to eat a lot of sodium   Mother had pre eclampsia and then developed ess HTN in her 50s Aunt also HTN   Is on yasmin-it has never caused elevated bp   Has never been on bp medicine   Lab Results  Component Value Date   CREATININE 0.59 09/10/2017   BUN 9 09/10/2017   NA 135 09/10/2017   K 3.6 09/10/2017   CL 108 09/10/2017   CO2 19 (L) 09/10/2017    Patient Active Problem List   Diagnosis Date Noted  . Essential hypertension 11/04/2017  . Mastitis, right, acute 09/21/2017  . Family history of sudden cardiac death 2016/06/29  . Grief reaction June 29, 2016  . Palpitations 06-29-16  . Fatigue 05/20/2015  . Bruising 05/20/2015  . Thirst 05/20/2015  . Acne vulgaris 04/28/2012  . Viral wart on right thumb 04/28/2012  . Rectal leakage 01/27/2011  . Anxiety  disorder 02/28/2007  . DEFORMITY, CAVUS, FOOT, ACQUIRED 02/23/2007   Past Medical History:  Diagnosis Date  . Acne   . Amenorrhea    Hx of  . Anxiety   . Eating disorder    ? eating disorder in past ( pt states just running too much)  . Hand eczema   . Seasonal allergies   . Underweight    Hx of   Past Surgical History:  Procedure Laterality Date  . WISDOM TOOTH EXTRACTION     Social History   Tobacco Use  . Smoking status: Never Smoker  . Smokeless tobacco: Never Used  Substance Use Topics  . Alcohol use: Yes    Alcohol/week: 0.0 oz    Comment: occasional wine--not since pregnant  . Drug use: No   Family History  Problem Relation Age of Onset  . Crohn's disease Brother   . Ulcerative colitis Brother   . Sudden Cardiac Death Brother   . Hypertension Mother        had also preeclampsia  . Diabetes Maternal Grandmother        Type 1  . Cancer Paternal Grandmother 17       breast  . Heart failure Paternal Grandfather  heart disease  . Cancer Paternal Aunt 58       breast   Allergies  Allergen Reactions  . Ortho Tri-Cyclen [Norgestimate-Eth Estradiol]     nausea  . Nickel Rash   Current Outpatient Medications on File Prior to Visit  Medication Sig Dispense Refill  . drospirenone-ethinyl estradiol (YASMIN 28) 3-0.03 MG tablet Take 1 tablet by mouth daily. 1 Package 11  . Vilazodone HCl 10 & 20 & 40 MG KIT Take by mouth.     No current facility-administered medications on file prior to visit.     Review of Systems  Constitutional: Negative for activity change, appetite change, fatigue, fever and unexpected weight change.  HENT: Negative for congestion, ear pain, rhinorrhea, sinus pressure and sore throat.   Eyes: Negative for pain, redness and visual disturbance.  Respiratory: Negative for cough, shortness of breath and wheezing.   Cardiovascular: Negative for chest pain and palpitations.  Gastrointestinal: Negative for abdominal pain, blood in stool,  constipation and diarrhea.  Endocrine: Negative for polydipsia and polyuria.  Genitourinary: Negative for dysuria, frequency and urgency.  Musculoskeletal: Negative for arthralgias, back pain and myalgias.  Skin: Negative for pallor and rash.  Allergic/Immunologic: Negative for environmental allergies.  Neurological: Negative for dizziness, syncope and headaches.  Hematological: Negative for adenopathy. Does not bruise/bleed easily.  Psychiatric/Behavioral: Negative for decreased concentration, dysphoric mood, self-injury, sleep disturbance and suicidal ideas. The patient is nervous/anxious.        Objective:   Physical Exam  Constitutional: She appears well-developed and well-nourished. No distress.  Well appearing   HENT:  Head: Normocephalic and atraumatic.  Mouth/Throat: Oropharynx is clear and moist.  Eyes: Pupils are equal, round, and reactive to light. Conjunctivae and EOM are normal.  Neck: Normal range of motion. Neck supple. No JVD present. Carotid bruit is not present. No thyromegaly present.  Cardiovascular: Normal rate, regular rhythm, normal heart sounds and intact distal pulses. Exam reveals no gallop.  Pulmonary/Chest: Effort normal and breath sounds normal. No respiratory distress. She has no wheezes. She has no rales.  No crackles  Abdominal: Soft. Bowel sounds are normal. She exhibits no distension, no abdominal bruit and no mass. There is no tenderness.  Musculoskeletal: She exhibits no edema or tenderness.  Lymphadenopathy:    She has no cervical adenopathy.  Neurological: She is alert. She has normal reflexes. No cranial nerve deficit. She exhibits normal muscle tone. Coordination normal.  No tremor   Skin: Skin is warm and dry. No rash noted. No pallor.  Psychiatric: Her speech is normal and behavior is normal. Thought content normal. Her mood appears not anxious. Her affect is not blunt and not labile. She does not exhibit a depressed mood.  Nl affect today    Pleasant and talkative           Assessment & Plan:   Problem List Items Addressed This Visit      Cardiovascular and Mediastinum   Essential hypertension - Primary    bp is mildly elevated since pregnancy (with a strong fam hx) Rev gyn notes BP Readings from Last 1 Encounters:  11/04/17 (!) 142/84   No changes needed Disc lifstyle change with low sodium diet and exercise  Last lab and EKG reviewed  Strong family hx  Will try lisinopril very low dose 5 mg  Alert if side eff like hypotension or cough F/u in approx 6 wk for visit and labs  Would need to change to beta blocker if she wants to become  pregnant again       Relevant Medications   lisinopril (PRINIVIL,ZESTRIL) 5 MG tablet     Other   Anxiety disorder    Overall improved after stopping breast feeding (stressful and did not work out)  However still struggling somewhat No imp on wellbutrin Would like to try Vilazodone - has a coupon (is $$)  Will try starter pack and see how tolerated Discussed expectations of this medication including time to effectiveness and mechanism of action, also poss of side effects (early and late)- including mental fuzziness, weight or appetite change, nausea and poss of worse dep or anxiety (even suicidal thoughts)  Pt voiced understanding and will stop med and update if this occurs   Enc to work on self care Continue walks daily and outdoor time  F/u 6 weeks or earlier if needed       Relevant Medications   Vilazodone HCl 10 & 20 & 40 MG KIT

## 2017-11-28 ENCOUNTER — Encounter: Payer: Self-pay | Admitting: Family Medicine

## 2017-12-05 ENCOUNTER — Encounter: Payer: Self-pay | Admitting: Family Medicine

## 2017-12-06 MED ORDER — VILAZODONE HCL 40 MG PO TABS: 40.0000 mg | ORAL_TABLET | Freq: Every day | ORAL | 5 refills | Status: DC

## 2017-12-06 NOTE — Telephone Encounter (Signed)
Med refill

## 2017-12-12 ENCOUNTER — Encounter: Payer: Self-pay | Admitting: Family Medicine

## 2017-12-12 ENCOUNTER — Ambulatory Visit (INDEPENDENT_AMBULATORY_CARE_PROVIDER_SITE_OTHER): Payer: Managed Care, Other (non HMO) | Admitting: Family Medicine

## 2017-12-12 VITALS — BP 138/86 | HR 63 | Temp 97.8°F | Ht 66.0 in | Wt 160.0 lb

## 2017-12-12 DIAGNOSIS — F419 Anxiety disorder, unspecified: Secondary | ICD-10-CM

## 2017-12-12 DIAGNOSIS — I10 Essential (primary) hypertension: Secondary | ICD-10-CM | POA: Diagnosis not present

## 2017-12-12 MED ORDER — LISINOPRIL 10 MG PO TABS: 10.0000 mg | ORAL_TABLET | Freq: Every day | ORAL | 3 refills | Status: DC

## 2017-12-12 NOTE — Patient Instructions (Addendum)
There are several apps for your cell phone for meditation  Headspace and Calm and Breethe and Smiling Mind  Check one out   Stick with the vilazodone 40 mg   If /when you are interested in counseling - let me know   Write in a journal  Think about meditation  Fit in exercise when you can   Let's increase lisinopril to 10 mg  If you get dizzy or other side effects- cut in 1/2 and take 5 mg and let me know

## 2017-12-12 NOTE — Progress Notes (Signed)
Subjective:    Patient ID: Tamara MayerKira J Gerwig, female    DOB: [birth year], 27 y.o.   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  Going well so far  Baby will be with gmother until [employer]    Wt Readings from Last 3 Encounters:  12/12/17 160 lb (72.6 kg)  11/04/17 160 lb 8 oz (72.8 kg)  10/18/17 163 lb 9 oz (74.2 kg)   25.82 kg/m    bp is stable today  No cp or palpitations or headaches or edema  No side effects to medicines  BP Readings from Last 3 Encounters:  12/12/17 138/86  11/04/17 (!) 142/84  10/18/17 118/82    Improved with low dose ace 5 mg -no side effects  Needs to drink enough water  Is interested in inc to 10 mg    On yasmin -no problems    For anxiety- on vilazodone 40 mg daily (inc dose on 5/28) No side effects from it   Worries - she worries about her child - a lot of guilt and anxiety about not breast feeding  Not really talking to anyone but her husband (helpful) Talks to her mom- she does not understand as much   Has not had counseling   Not a lot of time for exercise with work and new baby     Patient Active Problem List   Diagnosis Date Noted  . Essential hypertension 11/04/2017  . Mastitis, right, acute 09/21/2017  . Family history of sudden cardiac death 06/25/2016  . Grief reaction 06/25/2016  . Palpitations 06/25/2016  . Fatigue 05/20/2015  . Bruising 05/20/2015  . Thirst 05/20/2015  . Acne vulgaris 04/28/2012  . Viral wart on right thumb 04/28/2012  . Rectal leakage 01/27/2011  . Anxiety disorder 02/28/2007  . DEFORMITY, CAVUS, FOOT, ACQUIRED 02/23/2007   Past Medical History:  Diagnosis Date  . Acne   . Amenorrhea    Hx of  . Anxiety   . Eating disorder    ? eating disorder in past ( pt states just running too much)  . Hand eczema   . Seasonal allergies   . Underweight    Hx of   Past Surgical History:  Procedure Laterality Date  . WISDOM TOOTH EXTRACTION     Social  History   Tobacco Use  . Smoking status: Never Smoker  . Smokeless tobacco: Never Used  Substance Use Topics  . Alcohol use: Yes    Alcohol/week: 0.0 oz    Comment: occasional wine--not since pregnant  . Drug use: No   Family History  Problem Relation Age of Onset  . Crohn's disease Brother   . Ulcerative colitis Brother   . Sudden Cardiac Death Brother   . Hypertension Mother        had also preeclampsia  . Diabetes Maternal Grandmother        Type 1  . Cancer Paternal Grandmother [id number]       breast  . Heart failure Paternal Grandfather        heart disease  . Cancer Paternal Aunt 40       breast   Allergies  Allergen Reactions  . Ortho Tri-Cyclen [Norgestimate-Eth Estradiol]     nausea  . Nickel Rash   Current Outpatient Medications on File Prior to Visit  Medication Sig Dispense Refill  . drospirenone-ethinyl estradiol (YASMIN 28) 3-0.03 MG tablet Take 1 tablet by mouth daily. 1  Package 11  . Vilazodone HCl (VIIBRYD) 40 MG TABS Take 1 tablet (40 mg total) by mouth daily. 30 tablet 5   No current facility-administered medications on file prior to visit.     Review of Systems  Constitutional: Negative for activity change, appetite change, fatigue, fever and unexpected weight change.  HENT: Negative for congestion, ear pain, rhinorrhea, sinus pressure and sore throat.   Eyes: Negative for pain, redness and visual disturbance.  Respiratory: Negative for cough, shortness of breath and wheezing.   Cardiovascular: Negative for chest pain and palpitations.  Gastrointestinal: Negative for abdominal pain, blood in stool, constipation and diarrhea.  Endocrine: Negative for polydipsia and polyuria.  Genitourinary: Negative for dysuria, frequency and urgency.  Musculoskeletal: Negative for arthralgias, back pain and myalgias.  Skin: Negative for pallor and rash.  Allergic/Immunologic: Negative for environmental allergies.  Neurological: Negative for dizziness, syncope and  headaches.  Hematological: Negative for adenopathy. Does not bruise/bleed easily.  Psychiatric/Behavioral: Positive for decreased concentration. Negative for dysphoric mood, self-injury, sleep disturbance and suicidal ideas. The patient is nervous/anxious.        Objective:   Physical Exam  Constitutional: She appears well-developed and well-nourished. No distress.  Well appearing   HENT:  Head: Normocephalic and atraumatic.  Mouth/Throat: Oropharynx is clear and moist.  Eyes: Pupils are equal, round, and reactive to light. Conjunctivae and EOM are normal.  Neck: Normal range of motion. Neck supple. No JVD present. Carotid bruit is not present. No thyromegaly present.  Cardiovascular: Normal rate, regular rhythm, normal heart sounds and intact distal pulses. Exam reveals no gallop.  Pulmonary/Chest: Effort normal and breath sounds normal. No respiratory distress. She has no wheezes. She has no rales.  No crackles  Abdominal: Soft. Bowel sounds are normal. She exhibits no distension, no abdominal bruit and no mass. There is no tenderness.  Musculoskeletal: She exhibits no edema.  Lymphadenopathy:    She has no cervical adenopathy.  Neurological: She is alert. She has normal reflexes. She displays no tremor and normal reflexes. No cranial nerve deficit. She exhibits normal muscle tone. Coordination normal.  Skin: Skin is warm and dry. No rash noted.  Psychiatric: Her speech is normal and behavior is normal. Thought content normal. Her mood appears anxious. Her affect is not blunt, not labile and not inappropriate. She does not exhibit a depressed mood.  Mildly anxious- self admittedly  Not tearful  Disc stressors freely          Assessment & Plan:   Problem List Items Addressed This Visit      Cardiovascular and Mediastinum   Essential hypertension - Primary    BP: 138/86  Pt feels more comfortable inc lisinopril to 10 mg  Tolerating it well  Disc poss hypotension/cough side  eff  Update and cut dose if problems  Disc DASH eating and healthy exercise when able      Relevant Medications   lisinopril (PRINIVIL,ZESTRIL) 10 MG tablet   Other Relevant Orders   Basic metabolic panel     Other   Anxiety disorder    Overall improved but still a lot of worry about baby-esp with return to work Some irrational thoughts but not intrusive  Recently inc vilazodone to 40 mg  Will see how this works  Radio broadcast assistant to ref to counseling- no time currently but will consider later  Disc imp of self care (little time with new baby and work)

## 2017-12-12 NOTE — Assessment & Plan Note (Signed)
Overall improved but still a lot of worry about baby-esp with return to work Some irrational thoughts but not intrusive  Recently inc vilazodone to 40 mg  Will see how this works  Radio broadcast assistantffered to ref to counseling- no time currently but will consider later  Disc imp of self care (little time with new baby and work)

## 2017-12-12 NOTE — Assessment & Plan Note (Signed)
BP: 138/86  Pt feels more comfortable inc lisinopril to 10 mg  Tolerating it well  Disc poss hypotension/cough side eff  Update and cut dose if problems  Disc DASH eating and healthy exercise when able

## 2017-12-13 LAB — BASIC METABOLIC PANEL
BUN: 18 mg/dL (ref 6–23)
CHLORIDE: 109 meq/L (ref 96–112)
CO2: 21 meq/L (ref 19–32)
CREATININE: 0.65 mg/dL (ref 0.40–1.20)
Calcium: 9.2 mg/dL (ref 8.4–10.5)
GFR: 115.06 mL/min (ref >60.00)
GLUCOSE: 112 mg/dL — AB (ref 70–99)
POTASSIUM: 4.1 meq/L (ref 3.5–5.1)
Sodium: 140 mEq/L (ref 135–145)

## 2018-04-12 ENCOUNTER — Ambulatory Visit (INDEPENDENT_AMBULATORY_CARE_PROVIDER_SITE_OTHER): Payer: Managed Care, Other (non HMO)

## 2018-04-12 DIAGNOSIS — Z23 Encounter for immunization: Secondary | ICD-10-CM

## 2018-05-26 ENCOUNTER — Other Ambulatory Visit: Payer: Self-pay | Admitting: *Deleted

## 2018-05-26 ENCOUNTER — Encounter: Payer: Managed Care, Other (non HMO) | Admitting: Certified Nurse Midwife

## 2018-05-26 ENCOUNTER — Encounter: Payer: Self-pay | Admitting: Certified Nurse Midwife

## 2018-05-26 ENCOUNTER — Ambulatory Visit (INDEPENDENT_AMBULATORY_CARE_PROVIDER_SITE_OTHER): Payer: Managed Care, Other (non HMO) | Admitting: Certified Nurse Midwife

## 2018-05-26 ENCOUNTER — Other Ambulatory Visit: Payer: Self-pay

## 2018-05-26 ENCOUNTER — Other Ambulatory Visit (HOSPITAL_COMMUNITY)
Admission: RE | Admit: 2018-05-26 | Discharge: 2018-05-26 | Disposition: A | Discharge status: Home / Self Care | Payer: Managed Care, Other (non HMO) | Source: Ambulatory Visit | Attending: Obstetrics and Gynecology | Admitting: Obstetrics and Gynecology

## 2018-05-26 VITALS — BP 113/80 | HR 73 | Ht 65.75 in | Wt 155.9 lb

## 2018-05-26 DIAGNOSIS — Z01419 Encounter for gynecological examination (general) (routine) without abnormal findings: Secondary | ICD-10-CM | POA: Insufficient documentation

## 2018-05-26 DIAGNOSIS — Z124 Encounter for screening for malignant neoplasm of cervix: Secondary | ICD-10-CM | POA: Diagnosis present

## 2018-05-26 MED ORDER — VILAZODONE HCL 40 MG PO TABS: 40.0000 mg | ORAL_TABLET | Freq: Every day | ORAL | 11 refills | Status: DC

## 2018-05-26 NOTE — Patient Instructions (Signed)
Drospirenone; Ethinyl Estradiol tablets What is this medicine? DROSPIRENONE; ETHINYL ESTRADIOL (dro SPY re nown; ETH in il es tra DYE ole) is an oral contraceptive (birth control pill). This medicine combines two types of female hormones, an estrogen and a progestin. It is used to prevent ovulation and pregnancy. This medicine may be used for other purposes; ask your health care provider or pharmacist if you have questions. COMMON BRAND NAME(S): Glenis Smoker What should I tell my health care provider before I take this medicine? They need to know if you have or ever had any of these conditions: -abnormal vaginal bleeding -adrenal gland disease -blood vessel disease or blood clots -breast, cervical, endometrial, ovarian, liver, or uterine cancer -diabetes -gallbladder disease -heart disease or recent heart attack -high blood pressure -high cholesterol -high potassium level -kidney disease -liver disease -migraine headaches -stroke -systemic lupus erythematosus (SLE) -tobacco smoker -an unusual or allergic reaction to estrogens, progestins, or other medicines, foods, dyes, or preservatives -pregnant or trying to get pregnant -breast-feeding How should I use this medicine? Take this medicine by mouth. To reduce nausea, this medicine may be taken with food. Follow the directions on the prescription label. Take this medicine at the same time each day and in the order directed on the package. Do not take your medicine more often than directed. A patient package insert for the product will be given with each prescription and refill. Read this sheet carefully each time. The sheet may change frequently. Talk to your pediatrician regarding the use of this medicine in children. Special care may be needed. This medicine has been used in female children who have started having menstrual periods. Overdosage: If you think you have taken too  much of this medicine contact a poison control center or emergency room at once. NOTE: This medicine is only for you. Do not share this medicine with others. What if I miss a dose? If you miss a dose, refer to the patient information sheet you received with your medicine for direction. If you miss more than one pill, this medicine may not be as effective and you may need to use another form of birth control. What may interact with this medicine? Do not take this medicine with any of the following medications: -aminoglutethimide -amprenavir, fosamprenavir -atazanavir; cobicistat -anastrozole -bosentan -exemestane -letrozole -metyrapone -testolactone This medicine may also interact with the following medications: -acetaminophen -antiviral medicines for HIV or AIDS -aprepitant -barbiturates -certain antibiotics like rifampin, rifabutin, rifapentine, and possibly penicillins or tetracyclines -certain diuretics like amiloride, spironolactone, triamterene -certain medicines for fungal infections like griseofulvin, ketoconazole, itraconazole -certain medications for high blood pressure or heart conditions like ACE-inhibitors, Angiotensin-II receptor blockers, eplerenone -certain medicines for seizures like carbamazepine, oxcarbazepine, phenobarbital, phenytoin -cholestyramine -cobicistat -corticosteroid like hydrocortisone and prednisolone -cyclosporine -dantrolene -felbamate -grapefruit juice -heparin -lamotrigine -medicines for diabetes, including pioglitazone -modafinil -NSAIDs -potassium supplements -pyrimethamine -raloxifene -St. John's wort -sulfasalazine -tamoxifen -topiramate -thyroid hormones -warfarin his list may not describe all possible interactions. Give your health care provider a list of all the medicines, herbs, non-prescription drugs, or dietary supplements you use. Also tell them if you smoke, drink alcohol, or use illegal drugs. Some items may interact with  your medicine. This list may not describe all possible interactions. Give your health care provider a list of all the medicines, herbs, non-prescription drugs, or dietary supplements you use. Also tell them if you smoke, drink alcohol, or use illegal drugs. Some items may interact with your  medicine. What should I watch for while using this medicine? Visit your doctor or health care professional for regular checks on your progress. You will need a regular breast and pelvic exam and Pap smear while on this medicine. Use an additional method of contraception during the first cycle that you take these tablets. If you have any reason to think you are pregnant, stop taking this medicine right away and contact your doctor or health care professional. If you are taking this medicine for hormone related problems, it may take several cycles of use to see improvement in your condition. Smoking increases the risk of getting a blood clot or having a stroke while you are taking birth control pills, especially if you are more than 28 years old. You are strongly advised not to smoke. This medicine can make your body retain fluid, making your fingers, hands, or ankles swell. Your blood pressure can go up. Contact your doctor or health care professional if you feel you are retaining fluid. This medicine can make you more sensitive to the sun. Keep out of the sun. If you cannot avoid being in the sun, wear protective clothing and use sunscreen. Do not use sun lamps or tanning beds/booths. If you wear contact lenses and notice visual changes, or if the lenses begin to feel uncomfortable, consult your eye care specialist. In some women, tenderness, swelling, or minor bleeding of the gums may occur. Notify your dentist if this happens. Brushing and flossing your teeth regularly may help limit this. See your dentist regularly and inform your dentist of the medicines you are taking. If you are going to have elective surgery,  you may need to stop taking this medicine before the surgery. Consult your health care professional for advice. This medicine does not protect you against HIV infection (AIDS) or any other sexually transmitted diseases. What side effects may I notice from receiving this medicine? Side effects that you should report to your doctor or health care professional as soon as possible: -allergic reactions like skin rash, itching or hives, swelling of the face, lips, or tongue -breast tissue changes or discharge -changes in vision -chest pain -confusion, trouble speaking or understanding -dark urine -general ill feeling or flu-like symptoms -light-colored stools -nausea, vomiting -pain, swelling, warmth in the leg -right upper belly pain -severe headaches -shortness of breath -sudden numbness or weakness of the face, arm or leg -trouble walking, dizziness, loss of balance or coordination -unusual vaginal bleeding -yellowing of the eyes or skin Side effects that usually do not require medical attention (report to your doctor or health care professional if they continue or are bothersome): -acne -brown spots on the face -change in appetite -change in sexual desire -depressed mood or mood swings -fluid retention and swelling -stomach cramps or bloating -unusually weak or tired -weight gain This list may not describe all possible side effects. Call your doctor for medical advice about side effects. You may report side effects to FDA at 1-800-FDA-1088. Where should I keep my medicine? Keep out of the reach of children. Store at room temperature between 15 and 30 degrees C (59 and 86 degrees F). Throw away any unused medicine after the expiration date. NOTE: This sheet is a summary. It may not cover all possible information. If you have questions about this medicine, talk to your doctor, pharmacist, or health care provider.  2018 Elsevier/Gold Standard (2016-03-19 13:52:56) Preventive Care  18-39 Years, Female Preventive care refers to lifestyle choices and visits with your health care  provider that can promote health and wellness. What does preventive care include?  A yearly physical exam. This is also called an annual well check.  Dental exams once or twice a year.  Routine eye exams. Ask your health care provider how often you should have your eyes checked.  Personal lifestyle choices, including: ? Daily care of your teeth and gums. ? Regular physical activity. ? Eating a healthy diet. ? Avoiding tobacco and drug use. ? Limiting alcohol use. ? Practicing safe sex. ? Taking vitamin and mineral supplements as recommended by your health care provider. What happens during an annual well check? The services and screenings done by your health care provider during your annual well check will depend on your age, overall health, lifestyle risk factors, and family history of disease. Counseling Your health care provider may ask you questions about your:  Alcohol use.  Tobacco use.  Drug use.  Emotional well-being.  Home and relationship well-being.  Sexual activity.  Eating habits.  Work and work Statistician.  Method of birth control.  Menstrual cycle.  Pregnancy history.  Screening You may have the following tests or measurements:  Height, weight, and BMI.  Diabetes screening. This is done by checking your blood sugar (glucose) after you have not eaten for a while (fasting).  Blood pressure.  Lipid and cholesterol levels. These may be checked every 5 years starting at age 60.  Skin check.  Hepatitis C blood test.  Hepatitis B blood test.  Sexually transmitted disease (STD) testing.  BRCA-related cancer screening. This may be done if you have a family history of breast, ovarian, tubal, or peritoneal cancers.  Pelvic exam and Pap test. This may be done every 3 years starting at age 58. Starting at age 56, this may be done every 5 years if you  have a Pap test in combination with an HPV test.  Discuss your test results, treatment options, and if necessary, the need for more tests with your health care provider. Vaccines Your health care provider may recommend certain vaccines, such as:  Influenza vaccine. This is recommended every year.  Tetanus, diphtheria, and acellular pertussis (Tdap, Td) vaccine. You may need a Td booster every 10 years.  Varicella vaccine. You may need this if you have not been vaccinated.  HPV vaccine. If you are 36 or younger, you may need three doses over 6 months.  Measles, mumps, and rubella (MMR) vaccine. You may need at least one dose of MMR. You may also need a second dose.  Pneumococcal 13-valent conjugate (PCV13) vaccine. You may need this if you have certain conditions and were not previously vaccinated.  Pneumococcal polysaccharide (PPSV23) vaccine. You may need one or two doses if you smoke cigarettes or if you have certain conditions.  Meningococcal vaccine. One dose is recommended if you are age 27-21 years and a first-year college student living in a residence hall, or if you have one of several medical conditions. You may also need additional booster doses.  Hepatitis A vaccine. You may need this if you have certain conditions or if you travel or work in places where you may be exposed to hepatitis A.  Hepatitis B vaccine. You may need this if you have certain conditions or if you travel or work in places where you may be exposed to hepatitis B.  Haemophilus influenzae type b (Hib) vaccine. You may need this if you have certain risk factors.  Talk to your health care provider about which screenings and vaccines  you need and how often you need them. This information is not intended to replace advice given to you by your health care provider. Make sure you discuss any questions you have with your health care provider. Document Released: 08/24/2001 Document Revised: 03/17/2016 Document  Reviewed: 04/29/2015 Elsevier Interactive Patient Education  [employer].

## 2018-05-26 NOTE — Progress Notes (Signed)
Patient here for AE, no complaints.

## 2018-05-26 NOTE — Progress Notes (Signed)
ANNUAL PREVENTATIVE CARE GYN  ENCOUNTER NOTE  Subjective:       Tamara Mills is a 27 y.o. G34P1001 female here for a routine annual gynecologic exam.  Current complaints: 1. Needs Pap smear  Birth control refilled by PCP. Denies difficulty breathing or respiratory distress, chest pain, abdominal pain, excessive vaginal bleeding, dysuria, and leg pain or swelling.    Gynecologic History  Patient's last menstrual period was 05/12/2018 (approximate). Period Cycle (Days): 28 Period Duration (Days): 5 Period Pattern: Regular Menstrual Flow: Moderate Menstrual Control: Maxi pad Dysmenorrhea: (!) Mild Dysmenorrhea Symptoms: Cramping  Contraception: OCP (estrogen/progesterone)  Last Pap: 2016. Results were: normal  Obstetric History  OB History  Gravida Para Term Preterm AB Living  1 1 1     1   SAB TAB Ectopic Multiple Live Births        0 1    # Outcome Date GA Lbr Len/2nd Weight Sex Delivery Anes PTL Lv  1 Term 09/10/17 [redacted]w[redacted]d / 04:02 7 lb 15 oz (3.6 kg) F Vag-Spont EPI  LIV    Past Medical History:  Diagnosis Date  . Acne   . Amenorrhea    Hx of  . Anxiety   . Eating disorder    ? eating disorder in past ( pt states just running too much)  . Hand eczema   . Seasonal allergies   . Underweight    Hx of    Past Surgical History:  Procedure Laterality Date  . WISDOM TOOTH EXTRACTION      Current Outpatient Medications on File Prior to Visit  Medication Sig Dispense Refill  . drospirenone-ethinyl estradiol (YASMIN 28) 3-0.03 MG tablet Take 1 tablet by mouth daily. 1 Package 11  . lisinopril (PRINIVIL,ZESTRIL) 10 MG tablet Take 1 tablet (10 mg total) by mouth daily. 90 tablet 3  . Vilazodone HCl (VIIBRYD) 40 MG TABS Take 1 tablet (40 mg total) by mouth daily. 30 tablet 5   No current facility-administered medications on file prior to visit.     Allergies  Allergen Reactions  . Ortho Tri-Cyclen [Norgestimate-Eth Estradiol]     nausea  . Nickel Rash    Social  History   Socioeconomic History  . Marital status: Married    Spouse name: Not on file  . Number of children: Not on file  . Years of education: Not on file  . Highest education level: Not on file  Occupational History  . Occupation: Teacher, early years/pre  Social Needs  . Financial resource strain: Not on file  . Food insecurity:    Worry: Not on file    Inability: Not on file  . Transportation needs:    Medical: Not on file    Non-medical: Not on file  Tobacco Use  . Smoking status: Never Smoker  . Smokeless tobacco: Never Used  Substance and Sexual Activity  . Alcohol use: Yes    Comment: occasional  . Drug use: No  . Sexual activity: Yes    Partners: Male    Birth control/protection: OCP  Lifestyle  . Physical activity:    Days per week: Not on file    Minutes per session: Not on file  . Stress: Not on file  Relationships  . Social connections:    Talks on phone: Not on file    Gets together: Not on file    Attends religious service: Not on file    Active member of club or organization: Not on file    Attends meetings of  clubs or organizations: Not on file    Relationship status: Not on file  . Intimate partner violence:    Fear of current or ex partner: Not on file    Emotionally abused: Not on file    Physically abused: Not on file    Forced sexual activity: Not on file  Other Topics Concern  . Not on file  Social History Narrative  . Not on file    Family History  Problem Relation Age of Onset  . Crohn's disease Brother   . Ulcerative colitis Brother   . Sudden Cardiac Death Brother   . Hypertension Mother        had also preeclampsia  . Diabetes Maternal Grandmother        Type 1  . Cancer Paternal Grandmother 24       breast  . Breast cancer Paternal Grandmother   . Heart failure Paternal Grandfather        heart disease  . Cancer Paternal Aunt 40       breast  . Breast cancer Paternal Aunt   . Ovarian cancer Neg Hx   . Colon cancer Neg Hx     The  following portions of the patient's history were reviewed and updated as appropriate: allergies, current medications, past family history, past medical history, past social history, past surgical history and problem list.  Review of Systems  ROS negative except as noted above. Information obtained from patient.    Objective:   BP 113/80   Pulse 73   Ht 5' 5.75" (1.67 m)   Wt 155 lb 14.4 oz (70.7 kg)   LMP 05/12/2018 (Approximate)   Breastfeeding? No   BMI 25.35 kg/m    CONSTITUTIONAL: Well-developed, well-nourished female in no acute distress.   PSYCHIATRIC: Normal mood and affect. Normal behavior. Normal  judgment and thought content.  NEUROLGIC: Alert and oriented to person, place, and time. Normal muscle tone coordination. No cranial nerve deficit noted.  HENT:  Normocephalic, atraumatic, External right and left ear normal. Oropharynx is clear and moist  EYES: Conjunctivae and EOM are normal. Pupils are equal and round.   NECK: Normal range of motion, supple, no masses.  Normal thyroid.   SKIN: Skin is warm and dry. No rash noted. Not diaphoretic. No erythema. No pallor.  CARDIOVASCULAR: Normal heart rate noted, regular rhythm, no murmur.  RESPIRATORY: Clear to auscultation bilaterally. Effort and breath sounds normal, no problems with respiration noted.  BREASTS: Symmetric in size. No masses, skin changes, nipple drainage, or lymphadenopathy.  ABDOMEN: Soft, normal bowel sounds, no distention noted.  No tenderness, rebound or guarding.   PELVIC:  External Genitalia: Normal  Vagina: Normal  Cervix: Normal  Uterus: Normal  Adnexa: Normal  MUSCULOSKELETAL: Normal range of motion. No tenderness.  No cyanosis, clubbing, or edema.  2+ distal pulses.  LYMPHATIC: No Axillary, Supraclavicular, or Inguinal Adenopathy.  Assessment:   Annual gynecologic examination 27 y.o.   Contraception: OCP (estrogen/progesterone)   Normal BMI   Problem List Items Addressed This  Visit    None    Visit Diagnoses    Well woman exam    -  Primary   Relevant Orders   Cytology - PAP   Screening for cervical cancer       Relevant Orders   Cytology - PAP      Plan:   Pap: Pap, Reflex if ASCUS  Labs: Managed by PCP   Routine preventative health maintenance measures emphasized: Exercise/Diet/Weight control, Tobacco  Warnings, Alcohol/Substance use risks and Stress Management; see AVS  Reviewed red flag symptoms and when to call  RTC x 1 year for ANNUAL EXAM or sooner if needed   Gunnar BullaJenkins Michelle Dalonte Hardage, CNM [facility], [facility] 05/26/18 11:47 AM

## 2018-05-26 NOTE — Telephone Encounter (Signed)
Last filled on 12/06/17 #30 tabs with 5 refills, last OV was 12/12/17, please advise

## 2018-05-31 LAB — CYTOLOGY - PAP: DIAGNOSIS: NEGATIVE

## 2018-07-11 ENCOUNTER — Encounter: Payer: Self-pay | Admitting: Family Medicine

## 2018-07-11 ENCOUNTER — Ambulatory Visit (INDEPENDENT_AMBULATORY_CARE_PROVIDER_SITE_OTHER): Payer: Managed Care, Other (non HMO) | Admitting: Family Medicine

## 2018-07-11 VITALS — BP 122/82 | HR 67 | Temp 98.2°F | Ht 65.75 in | Wt 156.2 lb

## 2018-07-11 DIAGNOSIS — I10 Essential (primary) hypertension: Secondary | ICD-10-CM | POA: Diagnosis not present

## 2018-07-11 DIAGNOSIS — F419 Anxiety disorder, unspecified: Secondary | ICD-10-CM | POA: Diagnosis not present

## 2018-07-11 MED ORDER — VENLAFAXINE HCL ER 75 MG PO CP24: 75.0000 mg | ORAL_CAPSULE | Freq: Every day | ORAL | 5 refills | Status: DC

## 2018-07-11 NOTE — Assessment & Plan Note (Signed)
Well controlled on lisinopril now  Starting exercise as well BP: 122/82

## 2018-07-11 NOTE — Assessment & Plan Note (Signed)
Struggling with more stressors (new baby)- though things are going well  Reviewed stressors/ coping techniques/symptoms/ support sources/ tx options and side effects in detail today  Disc imp of self care  Starting exercise will certainly help Also eating well and getting sleep Will change back to effexor that worked well in the past 75 mg XR to start- inc to 150 when ready  If not helpful has option of going back to viibryd as well Counseling referral done

## 2018-07-11 NOTE — Progress Notes (Signed)
Subjective:    Patient ID: Tamara MayerKira J Mills, female    DOB: 25-Oct-1990, 27 y.o.   MRN: [id number]  HPI Here for f/u of chronic health problems   Wt Readings from Last 3 Encounters:  07/11/18 156 lb 4 oz (70.9 kg)  05/26/18 155 lb 14.4 oz (70.7 kg)  12/12/17 160 lb (72.6 kg)   25.41 kg/m   bp is stable today  No cp or palpitations or headaches or edema  No side effects to medicines  BP Readings from Last 3 Encounters:  07/11/18 122/82  05/26/18 113/80  12/12/17 138/86     Lab Results  Component Value Date   CREATININE 0.65 12/12/2017   BUN 18 12/12/2017   NA 140 12/12/2017   K 4.1 12/12/2017   CL 109 12/12/2017   CO2 21 12/12/2017   Takes viibryd for anxiety  Some vivid dreams  No weight gain from it  She gets very anxious if she misses a dose   Thinks she wants to try effexor again  Cheaper and may have worked better   Exercise- started a boot camp yesterday (will do at 5 am)  Getting back to routine and exercise    Is eating fair/not as well as she should No binging  Eats a lot in general   ? If she was more or less anxious on effexor (or was there less stress then) Had some weight gain and sweating on it    Patient Active Problem List   Diagnosis Date Noted  . Essential hypertension 11/04/2017  . Family history of sudden cardiac death 06/25/2016  . Fatigue 05/20/2015  . Bruising 05/20/2015  . Acne vulgaris 04/28/2012  . Viral wart on right thumb 04/28/2012  . Rectal leakage 01/27/2011  . Anxiety disorder 02/28/2007  . DEFORMITY, CAVUS, FOOT, ACQUIRED 02/23/2007   Past Medical History:  Diagnosis Date  . Acne   . Amenorrhea    Hx of  . Anxiety   . Eating disorder    ? eating disorder in past ( pt states just running too much)  . Hand eczema   . Seasonal allergies   . Underweight    Hx of   Past Surgical History:  Procedure Laterality Date  . WISDOM TOOTH EXTRACTION     Social History   Tobacco Use  . Smoking status: Never Smoker  .  Smokeless tobacco: Never Used  Substance Use Topics  . Alcohol use: Yes    Comment: occasional  . Drug use: No   Family History  Problem Relation Age of Onset  . Crohn's disease Brother   . Ulcerative colitis Brother   . Sudden Cardiac Death Brother   . Hypertension Mother        had also preeclampsia  . Diabetes Maternal Grandmother        Type 1  . Cancer Paternal Grandmother [id number]       breast  . Breast cancer Paternal Grandmother   . Heart failure Paternal Grandfather        heart disease  . Cancer Paternal Aunt 40       breast  . Breast cancer Paternal Aunt   . Ovarian cancer Neg Hx   . Colon cancer Neg Hx    Allergies  Allergen Reactions  . Ortho Tri-Cyclen [Norgestimate-Eth Estradiol]     nausea  . Nickel Rash   Current Outpatient Medications on File Prior to Visit  Medication Sig Dispense Refill  . drospirenone-ethinyl estradiol (YASMIN 28) 3-0.03 MG  tablet Take 1 tablet by mouth daily. 1 Package 11  . lisinopril (PRINIVIL,ZESTRIL) 10 MG tablet Take 1 tablet (10 mg total) by mouth daily. 90 tablet 3   No current facility-administered medications on file prior to visit.      Review of Systems  Constitutional: Negative for activity change, appetite change, fatigue, fever and unexpected weight change.  HENT: Negative for congestion, ear pain, rhinorrhea, sinus pressure and sore throat.   Eyes: Negative for pain, redness and visual disturbance.  Respiratory: Negative for cough, shortness of breath and wheezing.   Cardiovascular: Negative for chest pain and palpitations.  Gastrointestinal: Negative for abdominal pain, blood in stool, constipation and diarrhea.  Endocrine: Negative for polydipsia and polyuria.  Genitourinary: Negative for dysuria, frequency and urgency.  Musculoskeletal: Negative for arthralgias, back pain and myalgias.  Skin: Negative for pallor and rash.  Allergic/Immunologic: Negative for environmental allergies.  Neurological: Negative for  dizziness, syncope and headaches.  Hematological: Negative for adenopathy. Does not bruise/bleed easily.  Psychiatric/Behavioral: Negative for agitation, confusion, decreased concentration, dysphoric mood, self-injury and suicidal ideas. The patient is nervous/anxious.        Objective:   Physical Exam Constitutional:      General: She is not in acute distress.    Appearance: Normal appearance. She is well-developed and normal weight. She is not diaphoretic.  HENT:     Head: Normocephalic and atraumatic.     Mouth/Throat:     Mouth: Mucous membranes are moist.  Eyes:     Conjunctiva/sclera: Conjunctivae normal.     Pupils: Pupils are equal, round, and reactive to light.  Neck:     Musculoskeletal: Normal range of motion and neck supple.     Thyroid: No thyromegaly.     Vascular: No carotid bruit or JVD.  Cardiovascular:     Rate and Rhythm: Normal rate and regular rhythm.     Heart sounds: Normal heart sounds. No gallop.   Pulmonary:     Effort: Pulmonary effort is normal. No respiratory distress.     Breath sounds: Normal breath sounds. No wheezing or rales.  Abdominal:     General: Bowel sounds are normal. There is no distension or abdominal bruit.     Palpations: Abdomen is soft. There is no mass.     Tenderness: There is no abdominal tenderness.  Lymphadenopathy:     Cervical: No cervical adenopathy.  Skin:    General: Skin is warm and dry.     Findings: No rash.  Neurological:     General: No focal deficit present.     Mental Status: She is alert. Mental status is at baseline.     Coordination: Coordination normal.     Deep Tendon Reflexes: Reflexes are normal and symmetric. Reflexes normal.     Comments: No tremor  Psychiatric:        Attention and Perception: She is attentive. She does not perceive auditory or visual hallucinations.        Mood and Affect: Mood is anxious. Mood is not depressed. Affect is not blunt or flat.        Speech: Speech normal.         Behavior: Behavior normal.        Thought Content: Thought content normal.        Cognition and Memory: Cognition normal.     Comments: Mildly anxious             Assessment & Plan:   Problem List Items Addressed  This Visit      Cardiovascular and Mediastinum   Essential hypertension    Well controlled on lisinopril now  Starting exercise as well BP: 122/82          Other   Anxiety disorder - Primary    Struggling with more stressors (new baby)- though things are going well  Reviewed stressors/ coping techniques/symptoms/ support sources/ tx options and side effects in detail today  Disc imp of self care  Starting exercise will certainly help Also eating well and getting sleep Will change back to effexor that worked well in the past 75 mg XR to start- inc to 150 when ready  If not helpful has option of going back to viibryd as well Counseling referral done        Relevant Medications   venlafaxine XR (EFFEXOR-XR) 75 MG 24 hr capsule   Other Relevant Orders   Ambulatory referral to Psychology

## 2018-07-11 NOTE — Patient Instructions (Signed)
Stop viibryd  Switch to effexor xr 75 mg daily  If /when you want to increase the dose- let me know   Side effects - intolerable- let me know  If suicidal thoughts -let me know and stop medication   Exercise  Take time for yourself   Let's get you set up with counseling

## 2018-07-17 ENCOUNTER — Encounter: Payer: Self-pay | Admitting: Family Medicine

## 2018-08-02 ENCOUNTER — Other Ambulatory Visit: Payer: Self-pay | Admitting: Family Medicine

## 2018-09-14 ENCOUNTER — Other Ambulatory Visit: Payer: Self-pay

## 2018-09-15 ENCOUNTER — Other Ambulatory Visit: Payer: Self-pay | Admitting: *Deleted

## 2018-09-15 ENCOUNTER — Telehealth: Payer: Self-pay | Admitting: *Deleted

## 2018-09-15 MED ORDER — VILAZODONE HCL 40 MG PO TABS: 40.0000 mg | ORAL_TABLET | Freq: Every day | ORAL | 3 refills | Status: DC

## 2018-09-15 MED ORDER — LISINOPRIL 10 MG PO TABS: 10.0000 mg | ORAL_TABLET | Freq: Every day | ORAL | 1 refills | Status: DC

## 2018-09-15 NOTE — Telephone Encounter (Signed)
Left detailed message on voicemail. DPR 

## 2018-09-15 NOTE — Telephone Encounter (Signed)
I sent it for 40 mg once daily  If that is wrong please alert me  Thanks

## 2018-09-15 NOTE — Telephone Encounter (Signed)
Received fax requesting Rx for viibryd 40 [facility]

## 2018-09-18 ENCOUNTER — Telehealth: Payer: Self-pay

## 2018-09-18 NOTE — Telephone Encounter (Signed)
Aware, thanks!
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
## 2018-09-30 ENCOUNTER — Other Ambulatory Visit: Payer: Self-pay | Admitting: Certified Nurse Midwife

## 2018-10-02 ENCOUNTER — Encounter: Payer: Self-pay | Admitting: Family Medicine

## 2018-10-02 MED ORDER — DROSPIRENONE-ETHINYL ESTRADIOL 3-0.03 MG PO TABS: 1.0000 | ORAL_TABLET | Freq: Every day | ORAL | 3 refills | Status: DC

## 2018-11-15 ENCOUNTER — Encounter: Payer: Self-pay | Admitting: Family Medicine

## 2018-11-15 DIAGNOSIS — R197 Diarrhea, unspecified: Secondary | ICD-10-CM

## 2018-11-16 DIAGNOSIS — R197 Diarrhea, unspecified: Secondary | ICD-10-CM | POA: Insufficient documentation

## 2018-11-16 NOTE — Telephone Encounter (Signed)
I left a detailed message on patient's voice mail to call back and schedule non fasting lab asap.

## 2018-11-16 NOTE — Telephone Encounter (Signed)
Please call Tamara Mills to set up a non fasting drive up lab draw Thanks

## 2018-11-20 ENCOUNTER — Other Ambulatory Visit (INDEPENDENT_AMBULATORY_CARE_PROVIDER_SITE_OTHER): Payer: Managed Care, Other (non HMO)

## 2018-11-20 ENCOUNTER — Other Ambulatory Visit: Payer: Self-pay

## 2018-11-20 DIAGNOSIS — R197 Diarrhea, unspecified: Secondary | ICD-10-CM

## 2018-11-21 ENCOUNTER — Encounter: Payer: Self-pay | Admitting: Family Medicine

## 2018-11-21 DIAGNOSIS — R197 Diarrhea, unspecified: Secondary | ICD-10-CM

## 2018-11-21 LAB — COMPREHENSIVE METABOLIC PANEL
ALT: 14 U/L (ref 0–35)
AST: 16 U/L (ref 0–37)
Albumin: 4.5 g/dL (ref 3.5–5.2)
Alkaline Phosphatase: 63 U/L (ref 39–117)
BUN: 11 mg/dL (ref 6–23)
CO2: 23 mEq/L (ref 19–32)
Calcium: 9.3 mg/dL (ref 8.4–10.5)
Chloride: 102 mEq/L (ref 96–112)
Creatinine, Ser: 0.67 mg/dL (ref 0.40–1.20)
GFR: 103.85 mL/min (ref >60.00)
Glucose, Bld: 109 mg/dL — ABNORMAL HIGH (ref 70–99)
Potassium: 3.9 mEq/L (ref 3.5–5.1)
Sodium: 134 mEq/L — ABNORMAL LOW (ref 135–145)
Total Bilirubin: 0.8 mg/dL (ref 0.2–1.2)
Total Protein: 7.2 g/dL (ref 6.0–8.3)

## 2018-11-21 LAB — CBC WITH DIFFERENTIAL/PLATELET
Basophils Absolute: 0 10*3/uL (ref 0.0–0.1)
Basophils Relative: 0.7 % (ref 0.0–3.0)
Eosinophils Absolute: 0.2 10*3/uL (ref 0.0–0.7)
Eosinophils Relative: 2.2 % (ref 0.0–5.0)
HCT: 39.8 % (ref 36.0–46.0)
Hemoglobin: 13.7 g/dL (ref 12.0–15.0)
Lymphocytes Relative: 42.1 % (ref 12.0–46.0)
Lymphs Abs: 2.9 10*3/uL (ref 0.7–4.0)
MCHC: 34.4 g/dL (ref 30.0–36.0)
MCV: 85.3 fl (ref 78.0–100.0)
Monocytes Absolute: 0.5 10*3/uL (ref 0.1–1.0)
Monocytes Relative: 7.4 % (ref 3.0–12.0)
Neutro Abs: 3.3 10*3/uL (ref 1.4–7.7)
Neutrophils Relative %: 47.6 % (ref 43.0–77.0)
Platelets: 282 10*3/uL (ref 150.0–400.0)
RBC: 4.67 Mil/uL (ref 3.87–5.11)
RDW: 13.2 % (ref 11.5–15.5)
WBC: 7 10*3/uL (ref 4.0–10.5)

## 2018-11-21 LAB — TSH: TSH: 1.39 u[IU]/mL (ref 0.35–4.50)

## 2018-11-22 NOTE — Assessment & Plan Note (Signed)
Initially bloody with vasovagal symptoms  This resolved in 2 d  Now diarrhea and cramps occur whenever she exercises (even walking) No further blood  Lab on 11/20/2018  Component Date Value Ref Range Status  . TSH 11/20/2018 1.39  0.35 - 4.50 uIU/mL Final  . Sodium 11/20/2018 134* 135 - 145 mEq/L Final  . Potassium 11/20/2018 3.9  3.5 - 5.1 mEq/L Final  . Chloride 11/20/2018 102  96 - 112 mEq/L Final  . CO2 11/20/2018 23  19 - 32 mEq/L Final  . Glucose, Bld 11/20/2018 109* 70 - 99 mg/dL Final  . BUN 00/76/2263 11  6 - 23 mg/dL Final  . Creatinine, Ser 11/20/2018 0.67  0.40 - 1.20 mg/dL Final  . Total Bilirubin 11/20/2018 0.8  0.2 - 1.2 mg/dL Final  . Alkaline Phosphatase 11/20/2018 63  39 - 117 U/L Final  . AST 11/20/2018 16  0 - 37 U/L Final  . ALT 11/20/2018 14  0 - 35 U/L Final  . Total Protein 11/20/2018 7.2  6.0 - 8.3 g/dL Final  . Albumin 33/54/5625 4.5  3.5 - 5.2 g/dL Final  . Calcium 63/89/3734 9.3  8.4 - 10.5 mg/dL Final  . GFR 28/76/8115 103.85  >60.00 mL/min Final  . WBC 11/20/2018 7.0  4.0 - 10.5 K/uL Final  . RBC 11/20/2018 4.67  3.87 - 5.11 Mil/uL Final  . Hemoglobin 11/20/2018 13.7  12.0 - 15.0 g/dL Final  . HCT 72/62/0355 39.8  36.0 - 46.0 % Final  . MCV 11/20/2018 85.3  78.0 - 100.0 fl Final  . MCHC 11/20/2018 34.4  30.0 - 36.0 g/dL Final  . RDW 97/41/6384 13.2  11.5 - 15.5 % Final  . Platelets 11/20/2018 282.0  150.0 - 400.0 K/uL Final  . Neutrophils Relative % 11/20/2018 47.6  43.0 - 77.0 % Final  . Lymphocytes Relative 11/20/2018 42.1  12.0 - 46.0 % Final  . Monocytes Relative 11/20/2018 7.4  3.0 - 12.0 % Final  . Eosinophils Relative 11/20/2018 2.2  0.0 - 5.0 % Final  . Basophils Relative 11/20/2018 0.7  0.0 - 3.0 % Final  . Neutro Abs 11/20/2018 3.3  1.4 - 7.7 K/uL Final  . Lymphs Abs 11/20/2018 2.9  0.7 - 4.0 K/uL Final  . Monocytes Absolute 11/20/2018 0.5  0.1 - 1.0 K/uL Final  . Eosinophils Absolute 11/20/2018 0.2  0.0 - 0.7 K/uL Final  . Basophils  Absolute 11/20/2018 0.0  0.0 - 0.1 K/uL Final

## 2018-11-22 NOTE — Telephone Encounter (Signed)
Referral done Will route to [facility]  

## 2018-11-29 ENCOUNTER — Other Ambulatory Visit: Payer: Self-pay

## 2018-11-29 ENCOUNTER — Encounter: Payer: Self-pay | Admitting: Gastroenterology

## 2018-11-29 ENCOUNTER — Ambulatory Visit (INDEPENDENT_AMBULATORY_CARE_PROVIDER_SITE_OTHER): Payer: Managed Care, Other (non HMO) | Admitting: Gastroenterology

## 2018-11-29 VITALS — Ht 65.5 in | Wt 154.0 lb

## 2018-11-29 DIAGNOSIS — R197 Diarrhea, unspecified: Secondary | ICD-10-CM

## 2018-11-29 DIAGNOSIS — R109 Unspecified abdominal pain: Secondary | ICD-10-CM

## 2018-11-29 MED ORDER — HYOSCYAMINE SULFATE SL 0.125 MG SL SUBL: 1.0000 | SUBLINGUAL_TABLET | Freq: Two times a day (BID) | SUBLINGUAL | 1 refills | Status: DC

## 2018-11-29 MED ORDER — NA SULFATE-K SULFATE-MG SULF 17.5-3.13-1.6 GM/177ML PO SOLN: 1.0000 | Freq: Once | ORAL | 0 refills | Status: AC

## 2018-11-29 NOTE — Patient Instructions (Addendum)
Levsin 0.125 mg sublingual tablet twice daily as needed X 30 tablets (Prescription has been sent to your pharmacy) Suprep  Schedule colonoscopy, next available visit (12/13/2018 at 10:30am) We have mailed out instructions today, call with any questions   You have been scheduled for a colonoscopy. Please follow written instructions given to you at your visit today.  Please pick up your prep supplies at the pharmacy within the next 1-3 days. If you use inhalers (even only as needed), please bring them with you on the day of your procedure.  I appreciate the  opportunity to care for you  Thank You   Marsa Aris , MD

## 2018-11-29 NOTE — Progress Notes (Signed)
Lenon AhmadiKira J Dunson    [id number]    [birth year]  Primary Care Physician:Tower, Audrie GallusMarne A, MD  Referring Physician: Tower, Audrie GallusMarne A, MD [address] [place], [place] [id number]  This service was provided via audio and video telemedicine (Doximity) due to COVID 19 pandemic.  Patient location: Home Provider location: Office Used 2 patient identifiers to confirm the correct person. Explained the limitations in evaluation and management via telemedicine. Patient is aware of potential medical charges for this visit.  Patient consented to this virtual visit.  The persons participating in this telemedicine service were myself and the patient   Chief complaint: Diarrhea  HPI: 28 year old female with history of hypertension with complaints of intermittent diarrhea triggered with physical exertion or activity She had an episode of bloody diarrhea in January that self resolved.  She had episode after she did vigorous physical activity. Since then she is very mindful and is trying to avoid any vigorous physical activity but even with mild exertion like walking or working in the backyard she has an episode of watery diarrhea with fecal urgency.  Does not have blood in stool. She is asymptomatic in between the episodes. She has lower abdominal cramping prior to the diarrhea.  Outpatient Encounter Medications as of 11/29/2018  Medication Sig  . drospirenone-ethinyl estradiol (YASMIN 28) 3-0.03 MG tablet Take 1 tablet by mouth daily.  Marland Kitchen. lisinopril (PRINIVIL,ZESTRIL) 10 MG tablet Take 1 tablet (10 mg total) by mouth daily.  . Vilazodone HCl (VIIBRYD) 40 MG TABS Take 1 tablet (40 mg total) by mouth daily.   No facility-administered encounter medications on file as of 11/29/2018.     Allergies as of 11/29/2018 - Review Complete 11/29/2018  Allergen Reaction Noted  . Ortho tri-cyclen [norgestimate-eth estradiol]  09/18/2012  . Nickel Rash 01/27/2011    Past Medical History:  Diagnosis  Date  . Acne   . Amenorrhea    Hx of  . Anxiety   . Eating disorder    ? eating disorder in past ( pt states just running too much)  . Hand eczema   . Seasonal allergies   . Underweight    Hx of    Past Surgical History:  Procedure Laterality Date  . WISDOM TOOTH EXTRACTION      Family History  Problem Relation Age of Onset  . Crohn's disease Brother   . Ulcerative colitis Brother   . Sudden Cardiac Death Brother   . Hypertension Mother        had also preeclampsia  . Diabetes Maternal Grandmother        Type 1  . Cancer Paternal Grandmother 2760       breast  . Breast cancer Paternal Grandmother   . Heart failure Paternal Grandfather        heart disease  . Cancer Paternal Aunt 40       breast  . Breast cancer Paternal Aunt   . Ovarian cancer Neg Hx   . Colon cancer Neg Hx     Social History   Socioeconomic History  . Marital status: Married    Spouse name: Not on file  . Number of children: Not on file  . Years of education: Not on file  . Highest education level: Not on file  Occupational History  . 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 Financial resource strain: Not on file  . Food insecurity:    Worry: Not on file  Inability: Not on file  . Transportation needs:    Medical: Not on file    Non-medical: Not on file  Tobacco Use  . Smoking status: Never Smoker  . Smokeless tobacco: Never Used  Substance and Sexual Activity  . Alcohol use: Yes    Comment: occasional  . Drug use: No  . Sexual activity: Yes    Partners: Male    Birth control/protection: OCP  Lifestyle  . Physical activity:    Days per week: Not on file    Minutes per session: Not on file  . Stress: Not on file  Relationships  . Social connections:    Talks on phone: Not on file    Gets together: Not on file    Attends religious service: Not on file    Active member of club or organization: Not on file    Attends meetings of clubs or organizations: Not on file     Relationship status: Not on file  . Intimate partner violence:    Fear of current or ex partner: Not on file    Emotionally abused: Not on file    Physically abused: Not on file    Forced sexual activity: Not on file  Other Topics Concern  . Not on file  Social History Narrative  . Not on file      Review of systems: Review of Systems as per HPI All other systems reviewed and are negative.   Physical Exam: Vitals were not taken and physical exam was not performed during this virtual visit.  Data Reviewed:  Reviewed labs, radiology imaging, old records and pertinent past GI work up   Assessment and Plan/Recommendations:  28 year old female with intermittent episodic diarrhea exacerbated by physical exertion and history of bloody diarrhea in January. Unclear etiology for diarrhea. Will schedule colonoscopy to evaluate, obtain biopsies to exclude inflammatory bowel disease Levsin sublingual for severe abdominal cramping  The risks and benefits as well as alternatives of endoscopic procedure(s) have been discussed and reviewed. All questions answered. The patient agrees to proceed.     Iona Beard , MD   CC: Tower, Audrie Gallus, MD

## 2018-11-30 ENCOUNTER — Encounter: Payer: Self-pay | Admitting: Gastroenterology

## 2018-12-10 ENCOUNTER — Encounter: Payer: Self-pay | Admitting: Gastroenterology

## 2018-12-11 ENCOUNTER — Telehealth: Payer: Self-pay

## 2018-12-11 NOTE — Telephone Encounter (Signed)
Left Message

## 2018-12-12 ENCOUNTER — Telehealth: Payer: Self-pay | Admitting: *Deleted

## 2018-12-12 NOTE — Telephone Encounter (Signed)
Covid-19 screening questions  Have you traveled in the last 14 days?no If yes where?  Do you now or have you had a fever in the last 14 days?no  Do you have any respiratory symptoms of shortness of breath or cough now or in the last 14 days?no  Do you have any family members or close contacts with diagnosed or suspected Covid-19 in the past 14 days?no  Have you been tested for Covid-19 and found to be positive?no  Pt made aware of care partner policy and will wear a mask SM

## 2018-12-13 ENCOUNTER — Ambulatory Visit (AMBULATORY_SURGERY_CENTER): Payer: Managed Care, Other (non HMO) | Admitting: Gastroenterology

## 2018-12-13 ENCOUNTER — Other Ambulatory Visit: Payer: Self-pay

## 2018-12-13 ENCOUNTER — Encounter: Payer: Self-pay | Admitting: Gastroenterology

## 2018-12-13 VITALS — BP 103/55 | HR 65 | Temp 98.6°F | Resp 22 | Ht 65.5 in | Wt 154.0 lb

## 2018-12-13 DIAGNOSIS — R197 Diarrhea, unspecified: Secondary | ICD-10-CM

## 2018-12-13 MED ORDER — SODIUM CHLORIDE 0.9 % IV SOLN: 500.0000 mL | Freq: Once | INTRAVENOUS | Status: DC

## 2018-12-13 NOTE — Progress Notes (Signed)
History reviewed today  Temp and Covid 19 per [place], VS per Wendall Stade

## 2018-12-13 NOTE — Patient Instructions (Signed)
Continue present medications. Await pathology results.        YOU HAD AN ENDOSCOPIC PROCEDURE TODAY AT THE [facility]:   Refer to the procedure report that was given to you for any specific questions about what was found during the examination.  If the procedure report does not answer your questions, please call your gastroenterologist to clarify.  If you requested that your care partner not be given the details of your procedure findings, then the procedure report has been included in a sealed envelope for you to review at your convenience later.  YOU SHOULD EXPECT: Some feelings of bloating in the abdomen. Passage of more gas than usual.  Walking can help get rid of the air that was put into your GI tract during the procedure and reduce the bloating. If you had a lower endoscopy (such as a colonoscopy or flexible sigmoidoscopy) you may notice spotting of blood in your stool or on the toilet paper. If you underwent a bowel prep for your procedure, you may not have a normal bowel movement for a few days.  Please Note:  You might notice some irritation and congestion in your nose or some drainage.  This is from the oxygen used during your procedure.  There is no need for concern and it should clear up in a day or so.  SYMPTOMS TO REPORT IMMEDIATELY:   Following lower endoscopy (colonoscopy or flexible sigmoidoscopy):  Excessive amounts of blood in the stool  Significant tenderness or worsening of abdominal pains  Swelling of the abdomen that is new, acute  Fever of 100F or higher    For urgent or emergent issues, a gastroenterologist can be reached at any hour by calling [phone].   DIET:  We do recommend a small meal at first, but then you may proceed to your regular diet.  Drink plenty of fluids but you should avoid alcoholic beverages for 24 hours.  ACTIVITY:  You should plan to take it easy for the rest of today and you should NOT DRIVE or use heavy machinery  until tomorrow (because of the sedation medicines used during the test).    FOLLOW UP: Our staff will call the number listed on your records 48-72 hours following your procedure to check on you and address any questions or concerns that you may have regarding the information given to you following your procedure. If we do not reach you, we will leave a message.  We will attempt to reach you two times.  During this call, we will ask if you have developed any symptoms of COVID 19. If you develop any symptoms (ie: fever, flu-like symptoms, shortness of breath, cough etc.) before then, please call [phone].  If you test positive for Covid 19 in the 2 weeks post procedure, please call and report this information to us.    If any biopsies were taken you will be contacted by phone or by letter within the next 1-3 weeks.  Please call us at [phone] if you have not heard about the biopsies in 3 weeks.    SIGNATURES/CONFIDENTIALITY: You and/or your care partner have signed paperwork which will be entered into your electronic medical record.  These signatures attest to the fact that that the information above on your After Visit Summary has been reviewed and is understood.  Full responsibility of the confidentiality of this discharge information lies with you and/or your care-partner. 

## 2018-12-13 NOTE — Op Note (Addendum)
[facility] Patient Name: Tamara Mills Procedure Date: 12/13/2018 10:50 AM MRN: [id number] Endoscopist: Napoleon FormKavitha V. Sallie Staron , MD Age: 89 Referring MD:  Date of Birth: 1990/09/12 Gender: Female Account #: [id number] Procedure:                Colonoscopy Indications:              Clinically significant diarrhea of unexplained                            origin Medicines:                Monitored Anesthesia Care Procedure:                Pre-Anesthesia Assessment:                           - Prior to the procedure, a History and Physical                            was performed, and patient medications and                            allergies were reviewed. The patient's tolerance of                            previous anesthesia was also reviewed. The risks                            and benefits of the procedure and the sedation                            options and risks were discussed with the patient.                            All questions were answered, and informed consent                            was obtained. Prior Anticoagulants: The patient has                            taken no previous anticoagulant or antiplatelet                            agents. ASA Grade Assessment: II - A patient with                            mild systemic disease. After reviewing the risks                            and benefits, the patient was deemed in                            satisfactory condition to undergo the procedure.  After obtaining informed consent, the colonoscope                            was passed under direct vision. Throughout the                            procedure, the patient's blood pressure, pulse, and                            oxygen saturations were monitored continuously. The                            Colonoscope was introduced through the anus and                            advanced to the the terminal ileum, with              identification of the appendiceal orifice and IC                            valve. The colonoscopy was performed without                            difficulty. The patient tolerated the procedure                            well. The quality of the bowel preparation was                            excellent. The terminal ileum, ileocecal valve,                            appendiceal orifice, and rectum were photographed. Scope In: 11:05:46 AM Scope Out: 11:29:10 AM Scope Withdrawal Time: 0 hours 9 minutes 24 seconds  Total Procedure Duration: 0 hours 23 minutes 24 seconds  Findings:                 The perianal and digital rectal examinations were                            normal.                           The terminal ileum appeared normal. Biopsies were                            taken with a cold forceps for histology.                           The colon (entire examined portion) appeared                            normal. Biopsies were taken with a cold forceps for  histology. Complications:            No immediate complications. Estimated Blood Loss:     Estimated blood loss was minimal. Impression:               - The examined portion of the ileum was normal.                            Biopsied.                           - The entire examined colon is normal. Biopsied. Recommendation:           - Patient has a contact number available for                            emergencies. The signs and symptoms of potential                            delayed complications were discussed with the                            patient. Return to normal activities tomorrow.                            Written discharge instructions were provided to the                            patient.                           - Resume previous diet.                           - Continue present medications.                           - Await pathology results.                            - Repeat colonoscopy at age 68 for screening                            purposes. Napoleon Form, MD 12/13/2018 11:33:17 AM This report has been signed electronically.

## 2018-12-13 NOTE — Progress Notes (Signed)
Called to room to assist during endoscopic procedure.  Patient ID and intended procedure confirmed with present staff. Received instructions for my participation in the procedure from the performing physician.  

## 2018-12-13 NOTE — Progress Notes (Signed)
To PACU, VSS. Report to [email or web address] 

## 2018-12-15 ENCOUNTER — Telehealth: Payer: Self-pay

## 2018-12-15 NOTE — Telephone Encounter (Signed)
  Follow up Call-  Call back number 12/13/2018  Post procedure Call Back phone  # [phone]  Permission to leave phone message Yes  Some recent data might be hidden     Patient questions:  Do you have a fever, pain , or abdominal swelling? No. Pain Score  0 *  Have you tolerated food without any problems? Yes.    Have you been able to return to your normal activities? Yes.    Do you have any questions about your discharge instructions: Diet   No. Medications  No. Follow up visit  No.  Do you have questions or concerns about your Care? No.  Actions: * If pain score is 4 or above: No action needed, pain <4.  1. Have you developed a fever since your procedure no no 2.   Have you had an respiratory symptoms (SOB or cough) since your procedure? no no 3.   Have you tested positive for COVID 19 since your procedure no no 4.   Have you had any family members/close contacts diagnosed with the COVID 19 since your procedure?  no no  If yes to any of these questions please route to Laverna Peace, RN and Jennye Boroughs, Charity fundraiser.

## 2018-12-22 ENCOUNTER — Encounter: Payer: Self-pay | Admitting: Gastroenterology

## 2019-02-19 ENCOUNTER — Other Ambulatory Visit: Payer: Self-pay | Admitting: Family Medicine

## 2019-03-09 ENCOUNTER — Ambulatory Visit
Admission: EM | Admit: 2019-03-09 | Discharge: 2019-03-09 | Disposition: A | Discharge status: Home / Self Care | Payer: Managed Care, Other (non HMO) | Attending: Emergency Medicine | Admitting: Emergency Medicine

## 2019-03-09 DIAGNOSIS — Z20828 Contact with and (suspected) exposure to other viral communicable diseases: Secondary | ICD-10-CM

## 2019-03-09 DIAGNOSIS — Z20822 Contact with and (suspected) exposure to covid-19: Secondary | ICD-10-CM

## 2019-03-09 NOTE — Discharge Instructions (Addendum)
Your COVID test is pending: Is important you quarantine at home until your results are back. °You may take OTC Tylenol for fever and myalgias.  It is important to drink plenty of water throughout the day to stay hydrated. °If you test positive and later develop severe fever, cough, or shortness of breath, it is recommended that you go to the ER for further evaluation. °

## 2019-03-09 NOTE — ED Provider Notes (Signed)
[name]    CSN: [id number] Arrival date & time: 03/09/19  1639      History   Chief Complaint Chief Complaint  Patient presents with  . covid testing    HPI Tamara Mills is a 28 y.o. female with history of hypertension presenting for cover testing.  Patient works in a nursing home.  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   Past Medical History:  Diagnosis Date  . Acne   . Amenorrhea    Hx of  . Anxiety   . Eating disorder    ? eating disorder in past ( pt states just running too much)  . Hand eczema   . Hypertension   . Seasonal allergies   . Underweight    Hx of    Patient Active Problem List   Diagnosis Date Noted  . Diarrhea 11/16/2018  . Essential hypertension 11/04/2017  . Family history of sudden cardiac death 06/25/2016  . Fatigue 05/20/2015  . Bruising 05/20/2015  . Acne vulgaris 04/28/2012  . Viral wart on right thumb 04/28/2012  . Rectal leakage 01/27/2011  . Anxiety disorder 02/28/2007  . DEFORMITY, CAVUS, FOOT, ACQUIRED 02/23/2007    Past Surgical History:  Procedure Laterality Date  . WISDOM TOOTH EXTRACTION      OB History    Gravida  1   Para  1   Term  1   Preterm      AB      Living  1     SAB      TAB      Ectopic      Multiple  0   Live Births  1            Home Medications    Prior to Admission medications   Medication Sig Start Date End Date Taking? Authorizing Provider  drospirenone-ethinyl estradiol (YASMIN 28) 3-0.03 MG tablet Take 1 tablet by mouth daily. 10/02/18   Tower, Audrie GallusMarne A, MD  Hyoscyamine Sulfate SL (LEVSIN/SL) 0.125 MG SUBL Place 1 tablet under the tongue 2 (two) times a day. Patient not taking: Reported on 12/13/2018 11/29/18   Napoleon FormNandigam, Kavitha V,  MD  lisinopril (ZESTRIL) 10 MG tablet TAKE 1 TABLET DAILY 02/19/19   Tower, Audrie GallusMarne A, MD  Vilazodone HCl (VIIBRYD) 40 MG TABS Take 1 tablet (40 mg total) by mouth daily. 09/15/18   Tower, Audrie GallusMarne A, MD    Family History Family History  Problem Relation Age of Onset  . Crohn's disease Brother   . Ulcerative colitis Brother   . Sudden Cardiac Death Brother   . Hypertension Mother        had also preeclampsia  . Diabetes Maternal Grandmother        Type 1  . Cancer Paternal Grandmother 2960       breast  . Breast cancer Paternal Grandmother   . Heart failure Paternal Grandfather        heart disease  . Cancer Paternal Aunt 40       breast  . Breast cancer Paternal Aunt   . Ovarian cancer Neg Hx   . Colon cancer Neg Hx   . Esophageal cancer Neg Hx   . Rectal cancer Neg Hx   . Stomach cancer Neg Hx  Social History Social History   Tobacco Use  . Smoking status: Never Smoker  . Smokeless tobacco: Never Used  Substance Use Topics  . Alcohol use: Yes    Comment: occasional  . Drug use: No     Allergies   Ortho tri-cyclen [norgestimate-eth estradiol] and Nickel   Review of Systems Review of Systems  Constitutional: Negative for fatigue and fever.  HENT: Negative for congestion, dental problem, ear pain, facial swelling, hearing loss, sinus pain, sore throat, trouble swallowing and voice change.   Eyes: Negative for photophobia, pain, redness and visual disturbance.  Respiratory: Negative for cough and shortness of breath.   Cardiovascular: Negative for chest pain and palpitations.  Gastrointestinal: Negative for abdominal pain, diarrhea and vomiting.  Musculoskeletal: Negative for arthralgias and myalgias.  Skin: Negative for rash and wound.  Neurological: Negative for dizziness, syncope and headaches.     Physical Exam Triage Vital Signs ED Triage Vitals  Enc Vitals Group     BP      Pulse      Resp      Temp      Temp src      SpO2      Weight      Height       Head Circumference      Peak Flow      Pain Score      Pain Loc      Pain Edu?      Excl. in GC?    No data found.  Updated Vital Signs BP (!) 147/91 (BP Location: Left Arm)   Pulse 62   Temp 98.1 F (36.7 C) (Oral)   Resp 18   LMP 03/06/2019   SpO2 98%    Physical Exam Constitutional:      General: She is not in acute distress. HENT:     Head: Normocephalic and atraumatic.     Jaw: There is normal jaw occlusion. No tenderness or pain on movement.     Right Ear: Hearing, tympanic membrane, ear canal and external ear normal. No tenderness. No mastoid tenderness.     Left Ear: Hearing, tympanic membrane, ear canal and external ear normal. No tenderness. No mastoid tenderness.     Nose: No nasal deformity, septal deviation or nasal tenderness.     Right Turbinates: Not swollen or pale.     Left Turbinates: Not swollen or pale.     Right Sinus: No maxillary sinus tenderness or frontal sinus tenderness.     Left Sinus: No maxillary sinus tenderness or frontal sinus tenderness.     Mouth/Throat:     Lips: Pink. No lesions.     Mouth: Mucous membranes are moist. No injury.     Pharynx: Oropharynx is clear. Uvula midline. No posterior oropharyngeal erythema or uvula swelling.     Comments: no tonsillar exudate or hypertrophy Eyes:     General: No scleral icterus.    Pupils: Pupils are equal, round, and reactive to light.  Neck:     Musculoskeletal: Normal range of motion and neck supple. No muscular tenderness.  Cardiovascular:     Rate and Rhythm: Normal rate.  Pulmonary:     Effort: Pulmonary effort is normal.  Lymphadenopathy:     Cervical: No cervical adenopathy.  Skin:    Coloration: Skin is not jaundiced or pale.  Neurological:     Mental Status: She is alert and oriented to person, place, and time.      UC Treatments /  Results  Labs (all labs ordered are listed, but only abnormal results are displayed) Labs Reviewed  NOVEL CORONAVIRUS, NAA    EKG    Radiology No results found.  Procedures Procedures (including critical care time)  Medications Ordered in UC Medications - No data to display  Initial Impression / Assessment and Plan / UC Course  I have reviewed the triage vital signs and the nursing notes.  Pertinent labs & imaging results that were available during my care of the patient were reviewed by me and considered in my medical decision making (see chart for details).     1.  Exposure to COVID-19 virus Possible exposure, patient working with vulnerable population: Cover testing done.  Patient quarantine at home until the results are back per employer's mandate.  Return precautions discussed, patient verbalized understanding and is agreeable to plan. Final Clinical Impressions(s) / UC Diagnoses   Final diagnoses:  Exposure to Covid-19 Virus     Discharge Instructions     Your COVID test is pending: Is important you quarantine at home until your results are back. You may take OTC Tylenol for fever and myalgias.  It is important to drink plenty of water throughout the day to stay hydrated. If you test positive and later develop severe fever, cough, or shortness of breath, it is recommended that you go to the ER for further evaluation.    ED Prescriptions    None     Controlled Substance Prescriptions [place] Controlled Substance Registry consulted? Not Applicable   Quincy Sheehan, [place] 03/09/19 [id number]

## 2019-03-09 NOTE — ED Triage Notes (Signed)
Pt states her child has a fever and was ? Exposure to COVID. Her job requires testing

## 2019-03-11 ENCOUNTER — Encounter: Payer: Self-pay | Admitting: Family Medicine

## 2019-03-11 LAB — NOVEL CORONAVIRUS, NAA: SARS-CoV-2, NAA: NOT DETECTED

## 2019-03-12 ENCOUNTER — Telehealth (HOSPITAL_COMMUNITY): Payer: Self-pay | Admitting: Emergency Medicine

## 2019-03-12 NOTE — Telephone Encounter (Signed)
Negative. Pt called and made aware. Pt daughter tested positive for covid. All questions answered.

## 2019-03-14 ENCOUNTER — Other Ambulatory Visit: Payer: Self-pay

## 2019-03-14 DIAGNOSIS — Z20822 Contact with and (suspected) exposure to covid-19: Secondary | ICD-10-CM

## 2019-03-15 LAB — NOVEL CORONAVIRUS, NAA: SARS-CoV-2, NAA: DETECTED — AB

## 2019-04-16 ENCOUNTER — Other Ambulatory Visit: Payer: Self-pay | Admitting: *Deleted

## 2019-04-16 MED ORDER — DROSPIRENONE-ETHINYL ESTRADIOL 3-0.03 MG PO TABS: 1.0000 | ORAL_TABLET | Freq: Every day | ORAL | 1 refills | Status: DC

## 2019-04-16 NOTE — Telephone Encounter (Signed)
Received fax requesting Rx to be sent to mail order pharmacy. done 

## 2019-05-20 ENCOUNTER — Other Ambulatory Visit: Payer: Self-pay | Admitting: Family Medicine

## 2019-05-21 NOTE — Telephone Encounter (Signed)
Tamara Mills will reach out to pt to try to get appt scheduled

## 2019-05-21 NOTE — Telephone Encounter (Signed)
Have her f/u in the spring please  I refilled for 6 mo

## 2019-05-21 NOTE — Telephone Encounter (Signed)
Last OV was 07/11/18 and no future appts., please advise

## 2019-07-13 NOTE — L&D Delivery Note (Signed)
Delivery Note  1756 Called to room, SVE: 10/100/+2, vertex per RN. Patient reports pelvic pressure and ready to push.   Spontaneous vaginal birth of liveborn female infant in left occiput anterior position over intact perineum at 1807. Infant immediately to maternal abdomen. Delayed cord clamping, skin to skin, three (3) vessel cord and tube of cord blood collected. APGARs: 9, 9. Weight pending. Receiving nurse present at bedside for birth.   Pitocin bolus infusing. Spontaneous delivery of intact placenta followed by trailing membranes at 1815. Uterus firm. Rubra small. Periurethral laceration hemostatic, unrepaired. EBL: 125.  ml. Vault check completed under epidural anesthesia. Counts correct x 2.   Initiate routine postpartum care and orders. Mom to postpartum.  Baby to Couplet care / Skin to Skin.  FOB and doula present at bedside and overjoyed with the birth of "Tamara Mills".    Serafina Royals, CNM [facility], [facility] 06/09/2020, 7:34 PM

## 2019-08-18 ENCOUNTER — Other Ambulatory Visit: Payer: Self-pay | Admitting: Family Medicine

## 2019-08-20 NOTE — Telephone Encounter (Signed)
Yearly f/u scheduled on 11/02/19, last filled on 09/15/18 #90 tabs with 3 refills, it's early but it is going to a mail order pharmacy, please advise

## 2019-08-23 ENCOUNTER — Other Ambulatory Visit: Payer: Self-pay

## 2019-08-23 ENCOUNTER — Ambulatory Visit: Payer: Managed Care, Other (non HMO) | Admitting: Family Medicine

## 2019-08-23 ENCOUNTER — Encounter: Payer: Self-pay | Admitting: Family Medicine

## 2019-08-23 VITALS — BP 122/80 | HR 66 | Temp 97.2°F | Ht 65.5 in | Wt 148.5 lb

## 2019-08-23 DIAGNOSIS — I10 Essential (primary) hypertension: Secondary | ICD-10-CM | POA: Diagnosis not present

## 2019-08-23 DIAGNOSIS — Z3041 Encounter for surveillance of contraceptive pills: Secondary | ICD-10-CM | POA: Diagnosis not present

## 2019-08-23 DIAGNOSIS — F411 Generalized anxiety disorder: Secondary | ICD-10-CM | POA: Diagnosis not present

## 2019-08-23 MED ORDER — DROSPIRENONE-ETHINYL ESTRADIOL 3-0.03 MG PO TABS: 1.0000 | ORAL_TABLET | Freq: Every day | ORAL | 3 refills | Status: DC

## 2019-08-23 MED ORDER — VIIBRYD 40 MG PO TABS: 40.0000 mg | ORAL_TABLET | Freq: Every day | ORAL | 3 refills | Status: DC

## 2019-08-23 NOTE — Patient Instructions (Addendum)
Take care of yourself  Fit in self care when you can   Take lisinopril on your own schedule and let me know if BP increases   If you want to see a counselor here -let us know

## 2019-08-23 NOTE — Assessment & Plan Note (Signed)
Doing very well with generic yasmin - menses are tolerable No plans for pregnancy yet  Nl pap 11/19 at gyn reviewed

## 2019-08-23 NOTE — Progress Notes (Signed)
Subjective:    Patient ID: Tamara Mills, female    DOB: 09-15-1990, 29 y.o.   MRN: [id number]  This visit occurred during the SARS-CoV-2 public health emergency.  Safety protocols were in place, including screening questions prior to the visit, additional usage of staff PPE, and extensive cleaning of exam room while observing appropriate contact time as indicated for disinfecting solutions.    HPI Here for f/u of chronic medical problems   Has been feeling pretty well   Daughter is in the terrible 2s    Wt Readings from Last 3 Encounters:  08/23/19 148 lb 8 oz (67.4 kg)  12/13/18 154 lb (69.9 kg)  11/29/18 154 lb (69.9 kg)  wt is good  24.34 kg/m   bp is stable today  Has not been taking lisinopril because bp is lower than 140/90  No cp or palpitations or headaches or edema  No side effects to medicines  BP Readings from Last 3 Encounters:  08/23/19 132/86  03/09/19 (!) 147/91  12/13/18 (!) 103/55     Pulse Readings from Last 3 Encounters:  08/23/19 66  03/09/19 62  12/13/18 65    Lab Results  Component Value Date   CREATININE 0.67 11/20/2018   BUN 11 11/20/2018   NA 134 (L) 11/20/2018   K 3.9 11/20/2018   CL 102 11/20/2018   CO2 23 11/20/2018     Anxiety disorder - fairly stable   Taking vilazodone  SSRI and 5-HT1a partial antagonist  She tried going off of it a few times- gets more agitated and depressed pretty quickly (irritable)   A lot going on stress wise but doing fairly well Not much time for self care She moved also   She tried some virtual counselor - did not find it as helpful    Has been on effexor in the past Also prozac  OC- yasmin 28- doing well  Pap was 11/19 -neg at gyn Self breast exam  Patient Active Problem List   Diagnosis Date Noted  . Oral contraceptive use 08/23/2019  . Diarrhea 11/16/2018  . Essential hypertension 11/04/2017  . Family history of sudden cardiac death July 19, 2016  . Fatigue Jun 13, 2015  . Bruising  13-Jun-2015  . Acne vulgaris 04/28/2012  . Rectal leakage 01/27/2011  . Anxiety disorder 02/28/2007  . DEFORMITY, CAVUS, FOOT, ACQUIRED 02/23/2007   Past Medical History:  Diagnosis Date  . Acne   . Amenorrhea    Hx of  . Anxiety   . Eating disorder    ? eating disorder in past ( pt states just running too much)  . Hand eczema   . Hypertension   . Seasonal allergies   . Underweight    Hx of   Past Surgical History:  Procedure Laterality Date  . WISDOM TOOTH EXTRACTION     Social History   Tobacco Use  . Smoking status: Never Smoker  . Smokeless tobacco: Never Used  Substance Use Topics  . Alcohol use: Yes    Comment: occasional  . Drug use: No   Family History  Problem Relation Age of Onset  . Crohn's disease Brother   . Ulcerative colitis Brother   . Sudden Cardiac Death Brother   . Hypertension Mother        had also preeclampsia  . Diabetes Maternal Grandmother        Type 1  . Cancer Paternal Grandmother 47       breast  . Breast cancer Paternal Grandmother   .  Heart failure Paternal Grandfather        heart disease  . Cancer Paternal Aunt 73       breast  . Breast cancer Paternal Aunt   . Ovarian cancer Neg Hx   . Colon cancer Neg Hx   . Esophageal cancer Neg Hx   . Rectal cancer Neg Hx   . Stomach cancer Neg Hx    Allergies  Allergen Reactions  . Ortho Tri-Cyclen [Norgestimate-Eth Estradiol]     nausea  . Nickel Rash   Current Outpatient Medications on File Prior to Visit  Medication Sig Dispense Refill  . lisinopril (ZESTRIL) 10 MG tablet Take 10 mg by mouth daily as needed.     No current facility-administered medications on file prior to visit.    Review of Systems  Constitutional: Negative for activity change, appetite change, fatigue, fever and unexpected weight change.  HENT: Negative for congestion, ear pain, rhinorrhea, sinus pressure and sore throat.   Eyes: Negative for pain, redness and visual disturbance.  Respiratory: Negative  for cough, shortness of breath and wheezing.   Cardiovascular: Negative for chest pain and palpitations.  Gastrointestinal: Negative for abdominal pain, blood in stool, constipation and diarrhea.  Endocrine: Negative for polydipsia and polyuria.  Genitourinary: Negative for dysuria, frequency, menstrual problem and urgency.       No menstrual problems   Musculoskeletal: Negative for arthralgias, back pain and myalgias.  Skin: Negative for pallor and rash.  Allergic/Immunologic: Negative for environmental allergies.  Neurological: Negative for dizziness, syncope and headaches.  Hematological: Negative for adenopathy. Does not bruise/bleed easily.  Psychiatric/Behavioral: Positive for dysphoric mood. Negative for decreased concentration. The patient is nervous/anxious.        Mood is fairly well controlled with current medication  Stressors are high        Objective:   Physical Exam Constitutional:      General: She is not in acute distress.    Appearance: Normal appearance. She is well-developed and normal weight. She is not ill-appearing or diaphoretic.  HENT:     Head: Normocephalic and atraumatic.  Eyes:     Conjunctiva/sclera: Conjunctivae normal.     Pupils: Pupils are equal, round, and reactive to light.  Neck:     Thyroid: No thyromegaly.     Vascular: No carotid bruit or JVD.  Cardiovascular:     Rate and Rhythm: Normal rate and regular rhythm.     Heart sounds: Normal heart sounds. No gallop.   Pulmonary:     Effort: Pulmonary effort is normal. No respiratory distress.     Breath sounds: Normal breath sounds. No wheezing or rales.  Abdominal:     General: Bowel sounds are normal. There is no distension or abdominal bruit.     Palpations: Abdomen is soft. There is no mass.     Tenderness: There is no abdominal tenderness.  Musculoskeletal:     Cervical back: Normal range of motion and neck supple.  Lymphadenopathy:     Cervical: No cervical adenopathy.  Skin:     General: Skin is warm and dry.     Coloration: Skin is not pale.     Findings: No erythema or rash.  Neurological:     Mental Status: She is alert. Mental status is at baseline.     Motor: No tremor.     Coordination: Coordination normal.     Deep Tendon Reflexes: Reflexes are normal and symmetric.  Psychiatric:        Mood  and Affect: Mood normal.        Cognition and Memory: Cognition and memory normal.     Comments: Pleasant and talkative Discusses stressors candidly           Assessment & Plan:   Problem List Items Addressed This Visit      Cardiovascular and Mediastinum   Essential hypertension    BP is generally in the nl range with healthy habits, at this time pt does use lisinopril prn  (not a lot of time for self care with job and toddler) bp in fair control at this time  BP Readings from Last 1 Encounters:  08/23/19 122/80  did not take lisinopril today No changes needed Most recent labs reviewed  Disc lifstyle change with low sodium diet and exercise        Relevant Medications   lisinopril (ZESTRIL) 10 MG tablet     Other   Anxiety disorder - Primary    Doing fairly well with vilazodone so far - has not tolerated a wean so continues it Reviewed stressors/ coping techniques/symptoms/ support sources/ tx options and side effects in detail today Many stressors including toddler /job and busy life  Good coping skills Offered counseling referral at any time if she desires       Relevant Medications   Vilazodone HCl (VIIBRYD) 40 MG TABS   Oral contraceptive use    Doing very well with generic yasmin - menses are tolerable No plans for pregnancy yet  Nl pap 11/19 at gyn reviewed

## 2019-08-23 NOTE — Assessment & Plan Note (Signed)
BP is generally in the nl range with healthy habits, at this time pt does use lisinopril prn  (not a lot of time for self care with job and toddler) bp in fair control at this time  BP Readings from Last 1 Encounters:  08/23/19 122/80  did not take lisinopril today No changes needed Most recent labs reviewed  Disc lifstyle change with low sodium diet and exercise

## 2019-08-23 NOTE — Assessment & Plan Note (Signed)
Doing fairly well with vilazodone so far - has not tolerated a wean so continues it Reviewed stressors/ coping techniques/symptoms/ support sources/ tx options and side effects in detail today Many stressors including toddler /job and busy life  Good coping skills Offered counseling referral at any time if she desires

## 2019-10-19 ENCOUNTER — Other Ambulatory Visit: Payer: Self-pay

## 2019-10-19 ENCOUNTER — Encounter: Payer: Self-pay | Admitting: Certified Nurse Midwife

## 2019-10-19 ENCOUNTER — Ambulatory Visit: Payer: Managed Care, Other (non HMO) | Admitting: Certified Nurse Midwife

## 2019-10-19 VITALS — BP 136/73 | HR 59 | Ht 65.5 in | Wt 145.2 lb

## 2019-10-19 DIAGNOSIS — N926 Irregular menstruation, unspecified: Secondary | ICD-10-CM

## 2019-10-19 DIAGNOSIS — Z8759 Personal history of other complications of pregnancy, childbirth and the puerperium: Secondary | ICD-10-CM

## 2019-10-19 LAB — POCT URINE PREGNANCY: Preg Test, Ur: POSITIVE — AB

## 2019-10-19 MED ORDER — ASPIRIN EC 81 MG PO TBEC: 81.0000 mg | DELAYED_RELEASE_TABLET | Freq: Every day | ORAL | 2 refills | Status: DC

## 2019-10-19 NOTE — Progress Notes (Signed)
PT is present today for confirmation of pregnancy. Pt LMP  09/13/2019. UPT positive .

## 2019-10-19 NOTE — Patient Instructions (Addendum)
Pregnancy and Travel  Most pregnant women can safely travel until the last month of their pregnancy. Your doctor may recommend limiting or avoiding travel depending on how far you are in the pregnancy, and if you have any medical or pregnancy problems. General travel tips Before you go:  Discuss your trip with your doctor. Get examined shortly before you go.  Get a copy of your medical records. Take it with you.  Try to get names of doctors and hospitals in the area where you will be visiting.  Pack your pillow.  Pack any approved medicines and supplements.  Get enough sleep the night before the trip. During your trip:  Ask for locations of doctors and hospitals.  Wear flat, comfortable shoes.  Wear loose-fitting, comfortable clothes.  Wear compression stockings as told by your doctor. They may prevent blood clots that arise from sitting for a long time.  Do leg exercises as told by your doctor.  Eat a balanced diet, drink lots of fluid, and take your vitamins and supplements.  Take water, crackers, and fruit with you.  Take breaks to use the restroom and walk every 2 hours or during stops.  Do not wear yourself out.  Do not ride on a motorcycle.  Rest. If your trip is long, lie down for 30 or more minutes with your feet slightly raised after you reach your destination.  Always wear a seat belt. Tips for traveling to a foreign country Before you go:  Ask your doctor if there are medicines that are safe for you to take if you get diarrhea, constipation, nausea, or vomiting.  Check with your health insurance provider about medical coverage abroad. Purchase travel [employer], if needed.  Make sure you are up to date on vaccines. During your trip:  Do not eat uncooked foods.  Do not eat food from buffets or food that is cold or sitting at room temperature.  Drink bottled beverages and water. Do not use ice.  Wash fruits and vegetables with clean water. If  possible, peel them before eating.  Do not drink unpasteurized milk.  Wear insect repellent if there are mosquitoes or other biting insects. Ask your doctor which repellents are safe. What do I need to know about traveling by car?  Wear your seat belt properly. The belt should be buckled below your abdomen, on your hip bones. The shoulder belt should be off to the side of your abdomen and across the center of your chest.  If you are in the front seat, sit as far away from the dashboard as possible to avoid getting hit hard if the airbag deploys in an accident.  Do not travel for more than 5-6 hours a day. What do I need to know about traveling by bus?  Before making a reservation, ask whether your bus will have a restroom.  Move your arms and legs when seated.  If you have to use the restroom, hold on to the seats and handrails as you walk.  Do not travel for more than 5-6 hours a day. What do I need to know about traveling by train?  Before making a reservation, ask if your train will have a sleeping car and more than one restroom.  If you need to walk while the train is moving, hold on to seats and handrails.  Move your arms and legs when seated.  Do not travel for more than 5-6 hours a day. What do I need to know about traveling  by airplane?  Before booking your trip, ask about the airline's rules about pregnancy. Pregnant women may be restricted from [place] after a certain time of the pregnancy. Every airline has its own rules.  Make sure you complete your trip before 36 weeks of pregnancy.  Ask whether the airplane cabin will be pressurized. Do not board an unpressurized plane that will fly above 7,000 ft (2,100 m).  Try to get a bulkhead or an aisle seat so it is easier to get up, stretch, and use the bathroom.  Wear layers since the cabin temperature can change.  Put all your medicines and medical records in your carry-on bag.  Avoid drinking caffeinated or  carbonated beverages.  Avoid eating foods that may make you bloated.  Do not eat a big meal.  If you need to walk through the airplane, hold on to the seats and handrails.  Move your arms and legs when seated.  Wear your seat belt. What do I need to know about traveling by cruise ship?  Before booking your trip, ask the [place]: ? Are pregnant women allowed on the ship? ? Is there a medical facility and doctor on board? ? Does the ship dock in places where there are doctors and medical facilities?  Before booking your trip, ask your doctor: ? Is it safe to take medicines if I get seasick? ? Is it safe to wear acupressure wristbands to prevent seasickness? If the answer is yes, consider buying one. Contact a health care provider if:  You have diarrhea.  You vomit.  You have nausea or seasickness. Get help right away if:  You have vaginal bleeding.  You have severe vomiting or diarrhea.  You have pelvic or abdominal pain.  You have contractions.  Your water breaks.  You have a persistent headache.  Your eyesight changes or you see spots.  Your face or hands are swollen.  You have pain, warmth, or swelling in your legs or ankles. Summary  Most pregnant women can safely travel until the last month of their pregnancy. Your doctor may tell you to limit or avoid travel depending on how far you are in your pregnancy and if you have any medical or pregnancy problems.  The best time to travel is between 14 and 28 weeks of your pregnancy.  Before you go on your trip, make sure you discuss your trip with your health care provider, get a copy of your medical records, and try to get information on medical centers and doctors at your destination.  While on your trip, make sure you wear comfortable clothes and shoes, eat a healthy diet, drink plenty of fluids, take your vitamins and supplements, take breaks and rest often, and wear your seat belt.  Before booking  a flight, ask about the airline's rules about pregnancy. Every airline has its own rules. Do the same for a train, bus, or cruise ship. This information is not intended to replace advice given to you by your health care provider. Make sure you discuss any questions you have with your health care provider. Document Revised: 12/12/2018 Document Reviewed: 08/03/2016 Elsevier Patient Education  [place] Weight Gain During Pregnancy, Adult A certain amount of weight gain during pregnancy is normal and healthy. How much weight you should gain depends on your overall health and a measurement called BMI (body mass index). BMI is an estimate of your body fat based on your height and weight. You can use an  online calculator to figure out your BMI, or you can ask your health care provider to calculate it for you at your next visit. Your recommended pregnancy weight gain is based on your pre-pregnancy BMI. General guidelines for a healthy total weight gain during pregnancy are listed below. If your BMI at or before the start of your pregnancy is:  Less than 18.5 (underweight), you should gain 28-40 lb (13-18 kg).  18.5-24.9 (normal weight), you should gain 25-35 lb (11-16 kg).  25-29.9 (overweight), you should gain 15-25 lb (7-11 kg).  30 or higher (obese), you should gain 11-20 lb (5-9 kg). These ranges vary depending on your individual health. If you are carrying more than one baby (multiples), it may be safe to gain more weight than these recommendations. If you gain less weight than recommended, that may be safe as long as your baby is growing and developing normally. How can unhealthy weight gain affect me and my baby? Gaining too much weight during pregnancy can lead to pregnancy complications, such as:  A temporary form of diabetes that develops during pregnancy (gestational diabetes).  High blood pressure during pregnancy and protein in your urine (preeclampsia).  High blood  pressure during pregnancy without protein in your urine (gestational hypertension).  Your baby having a high weight at birth, which may: ? Raise your risk of having a more difficult delivery or a surgical delivery (cesarean delivery, or C-section). ? Raise your child's risk of developing obesity during childhood. Not gaining enough weight can be life-threatening for your baby, and it may raise your baby's chances of:  Being born early (preterm).  Growing more slowly than normal during pregnancy (growth restriction).  Having a low weight at birth. What actions can I take to gain a healthy amount of weight during pregnancy? General instructions  Keep track of your weight gain during pregnancy.  Take over-the-counter and prescription medicines only as told by your health care provider. Take all prenatal supplements as directed.  Keep all health care visits during pregnancy (prenatal visits). These visits are a good time to discuss your weight gain. Your health care provider will weigh you at each visit to make sure you are gaining a healthy amount of weight. Nutrition   Eat a balanced, nutrient-rich diet. Eat plenty of: ? Fruits and vegetables, such as berries and broccoli. ? Whole grains, such as millet, barley, whole-wheat breads and cereals, and oatmeal. ? Low-fat dairy products or non-dairy products such as almond milk or rice milk. ? Protein foods, such as lean meat, chicken, eggs, and legumes (such as peas, beans, soybeans, and lentils).  Avoid foods that are fried or have a lot of fat, salt (sodium), or sugar.  Drink enough fluid to keep your urine pale yellow.  Choose healthy snack and drink options when you are at work or on the go: ? Drink water. Avoid soda, sports drinks, and juices that have added sugar. ? Avoid drinks with caffeine, such as coffee and energy drinks. ? Eat snacks that are high in protein, such as nuts, protein bars, and low-fat yogurt. ? Carry convenient  snacks in your purse that do not need refrigeration, such as a pack of trail mix, an apple, or a granola bar.  If you need help improving your diet, work with a health care provider or a diet and nutrition specialist (dietitian). Activity   Exercise regularly, as told by your health care provider. ? If you were active before becoming pregnant, you may be able to  continue your regular fitness activities. ? If you were not active before pregnancy, you may gradually build up to exercising for 30 or more minutes on most days of the week. This may include walking, swimming, or yoga.  Ask your health care provider what activities are safe for you. Talk with your health care provider about whether you may need to be excused from certain school or work activities. Where to find more information Learn more about managing your weight gain during pregnancy from:  American Pregnancy Association: www.americanpregnancy.org  [place] Department of Agriculture pregnancy weight gain calculator: [employer] Summary  Too much weight gain during pregnancy can lead to complications for you and your baby.  Find out your pre-pregnancy BMI to determine how much weight gain is healthy for you.  Eat nutritious foods and stay active.  Keep all of your prenatal visits as told by your health care provider. This information is not intended to replace advice given to you by your health care provider. Make sure you discuss any questions you have with your health care provider. Document Revised: 03/21/2019 Document Reviewed: 03/18/2017 Elsevier Patient Education  [place].   Common Medications Safe in Pregnancy  Acne:      Constipation:  Benzoyl Peroxide     Colace  Clindamycin      Dulcolax Suppository  Topica Erythromycin     Fibercon  Salicylic Acid      Metamucil         Miralax AVOID:        Senakot   Accutane    Cough:  Retin-A       Cough Drops  Tetracycline      Phenergan w/ Codeine  if Rx  Minocycline      Robitussin (Plain & DM)  Antibiotics:     Crabs/Lice:  Ceclor       RID  Cephalosporins    AVOID:  E-Mycins      Kwell  Keflex  Macrobid/Macrodantin   Diarrhea:  Penicillin      Kao-Pectate  Zithromax      Imodium AD         PUSH FLUIDS AVOID:       Cipro     Fever:  Tetracycline      Tylenol (Regular or Extra  Minocycline       Strength)  Levaquin      Extra Strength-Do not          Exceed 8 tabs/24 hrs Caffeine:        <229m/day (equiv. To 1 cup of coffee or  approx. 3 12 oz sodas)         Gas: Cold/Hayfever:       Gas-X  Benadryl      Mylicon  Claritin       Phazyme  **Claritin-D        Chlor-Trimeton    Headaches:  Dimetapp      ASA-Free Excedrin  Drixoral-Non-Drowsy     Cold Compress  Mucinex (Guaifenasin)     Tylenol (Regular or Extra  Sudafed/Sudafed-12 Hour     Strength)  **Sudafed PE Pseudoephedrine   Tylenol Cold & Sinus     Vicks Vapor Rub  Zyrtec  **AVOID if Problems With Blood Pressure         Heartburn: Avoid lying down for at least 1 hour after meals  Aciphex      Maalox     Rash:  Milk of Magnesia     Benadryl  Mylanta       1% Hydrocortisone Cream  Pepcid  Pepcid Complete   Sleep Aids:  Prevacid      Ambien   Prilosec       Benadryl  Rolaids       Chamomile Tea  Tums (Limit 4/day)     Unisom  Zantac       Tylenol PM         Warm milk-add vanilla or  Hemorrhoids:       Sugar for taste  Anusol/Anusol H.C.  (RX: Analapram 2.5%)  Sugar Substitutes:  Hydrocortisone OTC     Ok in moderation  Preparation H      Tucks        Vaseline lotion applied to tissue with wiping    Herpes:     Throat:  Acyclovir      Oragel  Famvir  Valtrex     Vaccines:         Flu Shot Leg Cramps:       *Gardasil  Benadryl      Hepatitis A         Hepatitis B Nasal Spray:       Pneumovax  Saline Nasal Spray     Polio Booster         Tetanus Nausea:       Tuberculosis test or PPD  Vitamin B6 25 mg  TID   AVOID:    Dramamine      *Gardasil  Emetrol       Live Poliovirus  Ginger Root 250 mg QID    MMR (measles, mumps &  High Complex Carbs @ Bedtime    rebella)  Sea Bands-Accupressure    Varicella (Chickenpox)  Unisom 1/2 tab TID     *No known complications           If received before Pain:         Known pregnancy;   Darvocet       Resume series after  Lortab        Delivery  Percocet    Yeast:   Tramadol      Femstat  Tylenol 3      Gyne-lotrimin  Ultram       Monistat  Vicodin           MISC:         All Sunscreens           Hair Coloring/highlights          Insect Repellant's          (Including DEET)         Mystic Tans   First Trimester of Pregnancy  The first trimester of pregnancy is from week 1 until the end of week 13 (months 1 through 3). During this time, your baby will begin to develop inside you. At 6-8 weeks, the eyes and face are formed, and the heartbeat can be seen on ultrasound. At the end of 12 weeks, all the baby's organs are formed. Prenatal care is all the medical care you receive before the birth of your baby. Make sure you get good prenatal care and follow all of your doctor's instructions. Follow these instructions at home: Medicines  Take over-the-counter and prescription medicines only as told by your doctor. Some medicines are safe and some medicines are not safe during pregnancy.  Take a prenatal vitamin that contains at least 600 micrograms (mcg) of folic acid.  If you have trouble pooping (  constipation), take medicine that will make your stool soft (stool softener) if your doctor approves. Eating and drinking   Eat regular, healthy meals.  Your doctor will tell you the amount of weight gain that is right for you.  Avoid raw meat and uncooked cheese.  If you feel sick to your stomach (nauseous) or throw up (vomit): ? Eat 4 or 5 small meals a day instead of 3 large meals. ? Try eating a few soda crackers. ? Drink liquids between meals  instead of during meals.  To prevent constipation: ? Eat foods that are high in fiber, like fresh fruits and vegetables, whole grains, and beans. ? Drink enough fluids to keep your pee (urine) clear or pale yellow. Activity  Exercise only as told by your doctor. Stop exercising if you have cramps or pain in your lower belly (abdomen) or low back.  Do not exercise if it is too hot, too humid, or if you are in a place of great height (high altitude).  Try to avoid standing for long periods of time. Move your legs often if you must stand in one place for a long time.  Avoid heavy lifting.  Wear low-heeled shoes. Sit and stand up straight.  You can have sex unless your doctor tells you not to. Relieving pain and discomfort  Wear a good support bra if your breasts are sore.  Take warm water baths (sitz baths) to soothe pain or discomfort caused by hemorrhoids. Use hemorrhoid cream if your doctor says it is okay.  Rest with your legs raised if you have leg cramps or low back pain.  If you have puffy, bulging veins (varicose veins) in your legs: ? Wear support hose or compression stockings as told by your doctor. ? Raise (elevate) your feet for 15 minutes, 3-4 times a day. ? Limit salt in your food. Prenatal care  Schedule your prenatal visits by the twelfth week of pregnancy.  Write down your questions. Take them to your prenatal visits.  Keep all your prenatal visits as told by your doctor. This is important. Safety  Wear your seat belt at all times when driving.  Make a list of emergency phone numbers. The list should include numbers for family, friends, the hospital, and police and fire departments. General instructions  Ask your doctor for a referral to a local prenatal class. Begin classes no later than at the start of month 6 of your pregnancy.  Ask for help if you need counseling or if you need help with nutrition. Your doctor can give you advice or tell you where to go  for help.  Do not use hot tubs, steam rooms, or saunas.  Do not douche or use tampons or scented sanitary pads.  Do not cross your legs for long periods of time.  Avoid all herbs and alcohol. Avoid drugs that are not approved by your doctor.  Do not use any tobacco products, including cigarettes, chewing tobacco, and electronic cigarettes. If you need help quitting, ask your doctor. You may get counseling or other support to help you quit.  Avoid cat litter boxes and soil used by cats. These carry germs that can cause birth defects in the baby and can cause a loss of your baby (miscarriage) or stillbirth.  Visit your dentist. At home, brush your teeth with a soft toothbrush. Be gentle when you floss. Contact a doctor if:  You are dizzy.  You have mild cramps or pressure in your lower belly.  You have a nagging pain in your belly area.  You continue to feel sick to your stomach, you throw up, or you have watery poop (diarrhea).  You have a bad smelling fluid coming from your vagina.  You have pain when you pee (urinate).  You have increased puffiness (swelling) in your face, hands, legs, or ankles. Get help right away if:  You have a fever.  You are leaking fluid from your vagina.  You have spotting or bleeding from your vagina.  You have very bad belly cramping or pain.  You gain or lose weight rapidly.  You throw up blood. It may look like coffee grounds.  You are around people who have [place] measles, fifth disease, or chickenpox.  You have a very bad headache.  You have shortness of breath.  You have any kind of trauma, such as from a fall or a car accident. Summary  The first trimester of pregnancy is from week 1 until the end of week 13 (months 1 through 3).  To take care of yourself and your unborn baby, you will need to eat healthy meals, take medicines only if your doctor tells you to do so, and do activities that are safe for you and your baby.  Keep  all follow-up visits as told by your doctor. This is important as your doctor will have to ensure that your baby is healthy and growing well. This information is not intended to replace advice given to you by your health care provider. Make sure you discuss any questions you have with your health care provider. Document Revised: 10/19/2018 Document Reviewed: 07/06/2016 Elsevier Patient Education  2020 [employer].

## 2019-10-19 NOTE — Progress Notes (Signed)
GYN ENCOUNTER NOTE  Subjective:       Tamara Mills is a 29 y.o. G58P1001 female here for pregnancy confirmation.   Reports occasional breast tenderness and rare cramping.   Denies difficulty breathing or respiratory distress, chest pain, abdominal pain, vaginal bleeding, and leg pain or swelling.   This is a planned pregnancy. Taking prenatal vitamin.   History significant for gestational hypertension.    Gynecologic History  Patient's last menstrual period was 09/13/2019 (approximate). Period Pattern: (!) Irregular Menstrual Flow: Heavy, Light Menstrual Control: Thin pad Dysmenorrhea: None  Estimated date of birth: 06/19/2021  Gestational age: 40 weeks 1 day  Contraception: none  Last Pap: 05/2018. Results were: normal  Obstetric History  OB History  Gravida Para Term Preterm AB Living  1 1 1     1   SAB TAB Ectopic Multiple Live Births        0 1    # Outcome Date GA Lbr Len/2nd Weight Sex Delivery Anes PTL Lv  1 Term 09/10/17 [redacted]w[redacted]d / 04:02 7 lb 15 oz (3.6 kg) F Vag-Spont EPI  LIV    Past Medical History:  Diagnosis Date  . Acne   . Amenorrhea    Hx of  . Anxiety   . COVID-19   . Eating disorder    ? eating disorder in past ( pt states just running too much)  . Hand eczema   . Hypertension   . Seasonal allergies   . Underweight    Hx of    Past Surgical History:  Procedure Laterality Date  . WISDOM TOOTH EXTRACTION      Current Outpatient Medications on File Prior to Visit  Medication Sig Dispense Refill  . Prenatal Vit-Fe Fumarate-FA (PRENATAL MULTIVITAMIN) TABS tablet Take 1 tablet by mouth daily at 12 noon.    . drospirenone-ethinyl estradiol (YASMIN 28) 3-0.03 MG tablet Take 1 tablet by mouth daily. 3 Package 3  . lisinopril (ZESTRIL) 10 MG tablet Take 10 mg by mouth daily as needed.    . Vilazodone HCl (VIIBRYD) 40 MG TABS Take 1 tablet (40 mg total) by mouth daily. 90 tablet 3   No current facility-administered medications on file prior to  visit.    Allergies  Allergen Reactions  . Ortho Tri-Cyclen [Norgestimate-Eth Estradiol]     nausea  . Nickel Rash    Social History   Socioeconomic History  . Marital status: Married    Spouse name: Not on file  . Number of children: Not on file  . Years of education: Not on file  . Highest education level: Not on file  Occupational History  . Occupation: Pharmacist  Tobacco Use  . Smoking status: Never Smoker  . Smokeless tobacco: Never Used  Substance and Sexual Activity  . Alcohol use: Not Currently    Comment: occasional  . Drug use: No  . Sexual activity: Yes    Partners: Male    Birth control/protection: None  Other Topics Concern  . Not on file  Social History Narrative  . Not on file   Social Determinants of Health   Financial Resource Strain:   . Difficulty of Paying Living Expenses:   Food Insecurity:   . Worried About 12-16-1993 in the Last Year:   . Programme researcher, broadcasting/film/video in the Last Year:   Transportation Needs:   . Barista (Medical):   Freight forwarder Lack of Transportation (Non-Medical):   Physical Activity:   . Days of Exercise  per Week:   . Minutes of Exercise per Session:   Stress:   . Feeling of Stress :   Social Connections:   . Frequency of Communication with Friends and Family:   . Frequency of Social Gatherings with Friends and Family:   . Attends Religious Services:   . Active Member of Clubs or Organizations:   . Attends Archivist Meetings:   Marland Kitchen Marital Status:   Intimate Partner Violence:   . Fear of Current or Ex-Partner:   . Emotionally Abused:   Marland Kitchen Physically Abused:   . Sexually Abused:     Family History  Problem Relation Age of Onset  . Crohn's disease Brother   . Ulcerative colitis Brother   . Sudden Cardiac Death Brother   . Hypertension Mother        had also preeclampsia  . Diabetes Maternal Grandmother        Type 1  . Cancer Paternal Grandmother 40       breast  . Breast cancer Paternal  Grandmother   . Heart failure Paternal Grandfather        heart disease  . Cancer Paternal Aunt 82       breast  . Breast cancer Paternal Aunt   . Ovarian cancer Neg Hx   . Colon cancer Neg Hx   . Esophageal cancer Neg Hx   . Rectal cancer Neg Hx   . Stomach cancer Neg Hx     The following portions of the patient's history were reviewed and updated as appropriate: allergies, current medications, past family history, past medical history, past social history, past surgical history and problem list.  Review of Systems  ROS negative except as noted above. Information obtained from patient.   Objective:   BP 136/73   Pulse (!) 59   Ht 5' 5.5" (1.664 m)   Wt 145 lb 3 oz (65.9 kg)   LMP 09/13/2019 (Approximate)   BMI 23.79 kg/m    CONSTITUTIONAL: Well-developed, well-nourished female in no acute distress.   PHYSICAL EXAM: Not indicated.   Recent Results (from the past 2160 hour(s))  POCT urine pregnancy     Status: Abnormal   Collection Time: 10/19/19  8:52 AM  Result Value Ref Range   Preg Test, Ur Positive (A) Negative    Assessment:   1. Missed menses  - POCT urine pregnancy  2. History of gestational hypertension   Plan:   First trimester education; See AVS  Rx: Aspirin, see orders  Declines dating ultrasound.   Reviewed red flag symptoms and when to call.   RTC x 2-3 weeks for nurse intake, RTC x 6-7 weeks for NOB PE or sooner if needed.    Diona Fanti, CNM [facility], [facility] 10/19/19 9:20 AM

## 2019-11-02 ENCOUNTER — Ambulatory Visit: Payer: Managed Care, Other (non HMO) | Admitting: Family Medicine

## 2019-11-09 ENCOUNTER — Other Ambulatory Visit: Payer: Self-pay

## 2019-11-09 ENCOUNTER — Ambulatory Visit (INDEPENDENT_AMBULATORY_CARE_PROVIDER_SITE_OTHER): Payer: Managed Care, Other (non HMO) | Admitting: Certified Nurse Midwife

## 2019-11-09 VITALS — BP 132/80 | HR 61 | Ht 65.5 in | Wt 147.0 lb

## 2019-11-09 DIAGNOSIS — Z3491 Encounter for supervision of normal pregnancy, unspecified, first trimester: Secondary | ICD-10-CM

## 2019-11-09 NOTE — Progress Notes (Signed)
Tamara Mills presents for NOB nurse interview visit. Pregnancy confirmation done 10/19/2019. G2. P1001. Pregnancy education material explained and given. 0 cats in home. NOB labs ordered. TSH/HbgA1c ordered due to BMI 30 or greater. Sickle cell ordered due to patient's race. HIV labs and drug screen were explained and ordered. PNV encouraged. Genetic screening options discussed. Genetic testing: Unsure. Patient may discuss with the provider. Patient to follow up with provider on 11/30/2019 weeks for NOB physical. All questions answered.  Patient has no questions or concerns. She is going to check with her insurance company about cost of genetic testing.

## 2019-11-10 LAB — URINALYSIS, ROUTINE W REFLEX MICROSCOPIC
Bilirubin, UA: NEGATIVE
Glucose, UA: NEGATIVE
Ketones, UA: NEGATIVE
Nitrite, UA: NEGATIVE
Protein,UA: NEGATIVE
RBC, UA: NEGATIVE
Specific Gravity, UA: 1.023 (ref 1.005–1.030)
Urobilinogen, Ur: 0.2 mg/dL (ref 0.2–1.0)
pH, UA: 6 (ref 5.0–7.5)

## 2019-11-10 LAB — ABO AND RH: Rh Factor: POSITIVE

## 2019-11-10 LAB — MICROSCOPIC EXAMINATION
Casts: NONE SEEN /lpf
RBC, Urine: NONE SEEN /hpf (ref 0–2)
WBC, UA: NONE SEEN /hpf (ref 0–5)

## 2019-11-10 LAB — VARICELLA ZOSTER ANTIBODY, IGG: Varicella zoster IgG: 1720 index (ref >165)

## 2019-11-10 LAB — ANTIBODY SCREEN: Antibody Screen: NEGATIVE

## 2019-11-10 LAB — HIV ANTIBODY (ROUTINE TESTING W REFLEX): HIV Screen 4th Generation wRfx: NONREACTIVE

## 2019-11-10 LAB — RPR: RPR Ser Ql: NONREACTIVE

## 2019-11-10 LAB — HEPATITIS B SURFACE ANTIGEN: Hepatitis B Surface Ag: NEGATIVE

## 2019-11-10 LAB — RUBELLA SCREEN: Rubella Antibodies, IGG: 2.94 index (ref >0.99)

## 2019-11-11 LAB — GC/CHLAMYDIA PROBE AMP
Chlamydia trachomatis, NAA: NEGATIVE
Neisseria Gonorrhoeae by PCR: NEGATIVE

## 2019-11-11 LAB — CULTURE, OB URINE

## 2019-11-11 LAB — URINE CULTURE, OB REFLEX

## 2019-11-12 LAB — MONITOR DRUG PROFILE 14(MW)
Amphetamine Scrn, Ur: NEGATIVE ng/mL
BARBITURATE SCREEN URINE: NEGATIVE ng/mL
BENZODIAZEPINE SCREEN, URINE: NEGATIVE ng/mL
Buprenorphine, Urine: NEGATIVE ng/mL
CANNABINOIDS UR QL SCN: NEGATIVE ng/mL
Cocaine (Metab) Scrn, Ur: NEGATIVE ng/mL
Creatinine(Crt), U: 124.6 mg/dL (ref 20.0–300.0)
Fentanyl, Urine: NEGATIVE pg/mL
Meperidine Screen, Urine: NEGATIVE ng/mL
Methadone Screen, Urine: NEGATIVE ng/mL
OXYCODONE+OXYMORPHONE UR QL SCN: NEGATIVE ng/mL
Opiate Scrn, Ur: NEGATIVE ng/mL
Ph of Urine: 6 (ref 4.5–8.9)
Phencyclidine Qn, Ur: NEGATIVE ng/mL
Propoxyphene Scrn, Ur: NEGATIVE ng/mL
SPECIFIC GRAVITY: 1.023
Tramadol Screen, Urine: NEGATIVE ng/mL

## 2019-11-30 ENCOUNTER — Ambulatory Visit (INDEPENDENT_AMBULATORY_CARE_PROVIDER_SITE_OTHER): Payer: Managed Care, Other (non HMO) | Admitting: Certified Nurse Midwife

## 2019-11-30 ENCOUNTER — Other Ambulatory Visit: Payer: Self-pay

## 2019-11-30 VITALS — BP 113/73 | HR 57 | Wt 148.4 lb

## 2019-11-30 DIAGNOSIS — Z3A11 11 weeks gestation of pregnancy: Secondary | ICD-10-CM

## 2019-11-30 DIAGNOSIS — Z8759 Personal history of other complications of pregnancy, childbirth and the puerperium: Secondary | ICD-10-CM

## 2019-11-30 DIAGNOSIS — Z3481 Encounter for supervision of other normal pregnancy, first trimester: Secondary | ICD-10-CM

## 2019-11-30 DIAGNOSIS — O10919 Unspecified pre-existing hypertension complicating pregnancy, unspecified trimester: Secondary | ICD-10-CM

## 2019-11-30 DIAGNOSIS — O219 Vomiting of pregnancy, unspecified: Secondary | ICD-10-CM

## 2019-11-30 DIAGNOSIS — O26811 Pregnancy related exhaustion and fatigue, first trimester: Secondary | ICD-10-CM

## 2019-11-30 HISTORY — DX: Personal history of other complications of pregnancy, childbirth and the puerperium: Z87.59

## 2019-11-30 LAB — POCT URINALYSIS DIPSTICK OB
Bilirubin, UA: NEGATIVE
Blood, UA: NEGATIVE
Glucose, UA: NEGATIVE
Ketones, UA: NEGATIVE
Leukocytes, UA: NEGATIVE
Nitrite, UA: NEGATIVE
POC,PROTEIN,UA: NEGATIVE
Spec Grav, UA: <=1.005 — AB (ref 1.010–1.025)
Urobilinogen, UA: 0.2 E.U./dL
pH, UA: 5 (ref 5.0–8.0)

## 2019-11-30 NOTE — Patient Instructions (Addendum)
Exercise During Pregnancy Exercise is an important part of being healthy for people of all ages. Exercise improves the function of your heart and lungs and helps you maintain strength, flexibility, and a healthy body weight. Exercise also boosts energy levels and elevates mood. Most women should exercise regularly during pregnancy. In rare cases, women with certain medical conditions or complications may be asked to limit or avoid exercise during pregnancy. How does this affect me? Along with maintaining general strength and flexibility, exercising during pregnancy can help:  Keep strength in muscles that are used during labor and childbirth.  Decrease low back pain.  Reduce symptoms of depression.  Control weight gain during pregnancy.  Reduce the risk of needing insulin if you develop diabetes during pregnancy.  Decrease the risk of cesarean delivery.  Speed up your recovery after giving birth. How does this affect my baby? Exercise can help you have a healthy pregnancy. Exercise does not cause premature birth. It will not cause your baby to weigh less at birth. What exercises can I do? Many exercises are safe for you to do during pregnancy. Do a variety of exercises that safely increase your heart and breathing rates and help you build and maintain muscle strength. Do exercises exactly as told by your health care provider. You may do these exercises:  Walking or hiking.  Swimming.  Water aerobics.  Riding a stationary bike.  Strength training.  Modified yoga or Pilates. Tell your instructor that you are pregnant. Avoid overstretching, and avoid lying on your back for long periods of time.  Running or jogging. Only choose this type of exercise if you: ? Ran or jogged regularly before your pregnancy. ? Can run or jog and still talk in complete sentences. What exercises should I avoid? Depending on your level of fitness and whether you exercised regularly before your  pregnancy, you may be told to limit high-intensity exercise. You can tell that you are exercising at a high intensity if you are breathing much harder and faster and cannot hold a conversation while exercising. You must avoid:  Contact sports.  Activities that put you at risk for falling on or being hit in the belly, such as downhill skiing, water skiing, surfing, rock climbing, cycling, gymnastics, and horseback riding.  Scuba diving.  Skydiving.  Yoga or Pilates in a room that is heated to high temperatures.  Jogging or running, unless you ran or jogged regularly before your pregnancy. While jogging or running, you should always be able to talk in full sentences. Do not run or jog so fast that you are unable to have a conversation.  Do not exercise at more than 6,000 feet above sea level (high elevation) if you are not used to exercising at high elevation. How do I exercise in a safe way?   Avoid overheating. Do not exercise in very high temperatures.  Wear loose-fitting, breathable clothes.  Avoid dehydration. Drink enough water before, during, and after exercise to keep your urine pale yellow.  Avoid overstretching. Because of hormone changes during pregnancy, it is easy to overstretch muscles, tendons, and ligaments during pregnancy.  Start slowly and ask your health care provider to recommend the types of exercise that are safe for you.  Do not exercise to lose weight. Follow these instructions at home:  Exercise on most days or all days of the week. Try to exercise for 30 minutes a day, 5 days a week, unless your health care provider tells you not to.  If  you actively exercised before your pregnancy and you are healthy, your health care provider may tell you to continue to do moderate to high-intensity exercise.  If you are just starting to exercise or did not exercise much before your pregnancy, your health care provider may tell you to do low to moderate-intensity  exercise. Questions to ask your health care provider  Is exercise safe for me?  What are signs that I should stop exercising?  Does my health condition mean that I should not exercise during pregnancy?  When should I avoid exercising during pregnancy? Stop exercising and contact a health care provider if: You have any unusual symptoms, such as:  Mild contractions of the uterus or cramps in the abdomen.  Dizziness that does not go away when you rest. Stop exercising and get help right away if: You have any unusual symptoms, such as:  Sudden, severe pain in your low back or your belly.  Mild contractions of the uterus or cramps in the abdomen that do not improve with rest and drinking fluids.  Chest pain.  Bleeding or fluid leaking from your vagina.  Shortness of breath. These symptoms may represent a serious problem that is an emergency. Do not wait to see if the symptoms will go away. Get medical help right away. Call your local emergency services (911 in the [place] Do not drive yourself to the hospital. Summary  Most women should exercise regularly throughout pregnancy. In rare cases, women with certain medical conditions or complications may be asked to limit or avoid exercise during pregnancy.  Do not exercise to lose weight during pregnancy.  Your health care provider will tell you what level of physical activity is right for you.  Stop exercising and contact a health care provider if you have mild contractions of the uterus or cramps in the abdomen. Get help right away if these contractions or cramps do not improve with rest and drinking fluids.  Stop exercising and get help right away if you have sudden, severe pain in your low back or belly, chest pain, shortness of breath, or bleeding or leaking of fluid from your vagina. This information is not intended to replace advice given to you by your health care provider. Make sure you discuss any questions you have with your  health care provider. Document Revised: 10/19/2018 Document Reviewed: 08/02/2018 Elsevier Patient Education  [place] Weight Gain During Pregnancy, Adult A certain amount of weight gain during pregnancy is normal and healthy. How much weight you should gain depends on your overall health and a measurement called BMI (body mass index). BMI is an estimate of your body fat based on your height and weight. You can use an [employer] to figure out your BMI, or you can ask your health care provider to calculate it for you at your next visit. Your recommended pregnancy weight gain is based on your pre-pregnancy BMI. General guidelines for a healthy total weight gain during pregnancy are listed below. If your BMI at or before the start of your pregnancy is:  Less than 18.5 (underweight), you should gain 28-40 lb (13-18 kg).  18.5-24.9 (normal weight), you should gain 25-35 lb (11-16 kg).  25-29.9 (overweight), you should gain 15-25 lb (7-11 kg).  30 or higher (obese), you should gain 11-20 lb (5-9 kg). These ranges vary depending on your individual health. If you are carrying more than one baby (multiples), it may be safe to gain more weight than these recommendations.  If you gain less weight than recommended, that may be safe as long as your baby is growing and developing normally. How can unhealthy weight gain affect me and my baby? Gaining too much weight during pregnancy can lead to pregnancy complications, such as:  A temporary form of diabetes that develops during pregnancy (gestational diabetes).  High blood pressure during pregnancy and protein in your urine (preeclampsia).  High blood pressure during pregnancy without protein in your urine (gestational hypertension).  Your baby having a high weight at birth, which may: ? Raise your risk of having a more difficult delivery or a surgical delivery (cesarean delivery, or C-section). ? Raise your child's risk of  developing obesity during childhood. Not gaining enough weight can be life-threatening for your baby, and it may raise your baby's chances of:  Being born early (preterm).  Growing more slowly than normal during pregnancy (growth restriction).  Having a low weight at birth. What actions can I take to gain a healthy amount of weight during pregnancy? General instructions  Keep track of your weight gain during pregnancy.  Take over-the-counter and prescription medicines only as told by your health care provider. Take all prenatal supplements as directed.  Keep all health care visits during pregnancy (prenatal visits). These visits are a good time to discuss your weight gain. Your health care provider will weigh you at each visit to make sure you are gaining a healthy amount of weight. Nutrition   Eat a balanced, nutrient-rich diet. Eat plenty of: ? Fruits and vegetables, such as berries and broccoli. ? Whole grains, such as millet, barley, whole-wheat breads and cereals, and oatmeal. ? Low-fat dairy products or non-dairy products such as almond milk or rice milk. ? Protein foods, such as lean meat, chicken, eggs, and legumes (such as peas, beans, soybeans, and lentils).  Avoid foods that are fried or have a lot of fat, salt (sodium), or sugar.  Drink enough fluid to keep your urine pale yellow.  Choose healthy snack and drink options when you are at work or on the go: ? Drink water. Avoid soda, sports drinks, and juices that have added sugar. ? Avoid drinks with caffeine, such as coffee and energy drinks. ? Eat snacks that are high in protein, such as nuts, protein bars, and low-fat yogurt. ? Carry convenient snacks in your purse that do not need refrigeration, such as a pack of trail mix, an apple, or a granola bar.  If you need help improving your diet, work with a health care provider or a diet and nutrition specialist (dietitian). Activity   Exercise regularly, as told by  your health care provider. ? If you were active before becoming pregnant, you may be able to continue your regular fitness activities. ? If you were not active before pregnancy, you may gradually build up to exercising for 30 or more minutes on most days of the week. This may include walking, swimming, or yoga.  Ask your health care provider what activities are safe for you. Talk with your health care provider about whether you may need to be excused from certain school or work activities. Where to find more information Learn more about managing your weight gain during pregnancy from:  American Pregnancy Association: www.americanpregnancy.org  [place] Department of Agriculture pregnancy weight gain calculator: FormerBoss.no Summary  Too much weight gain during pregnancy can lead to complications for you and your baby.  Find out your pre-pregnancy BMI to determine how much weight gain is healthy for you.  Eat nutritious foods and stay active.  Keep all of your prenatal visits as told by your health care provider. This information is not intended to replace advice given to you by your health care provider. Make sure you discuss any questions you have with your health care provider. Document Revised: 03/21/2019 Document Reviewed: 03/18/2017 Elsevier Patient Education  2020 [employer].   Prenatal Care Prenatal care is health care during pregnancy. It helps you and your unborn baby (fetus) stay as healthy as possible. Prenatal care may be provided by a midwife, a family practice health care provider, or a childbirth and pregnancy specialist (obstetrician). How does this affect me? During pregnancy, you will be closely monitored for any new conditions that might develop. To lower your risk of pregnancy complications, you and your health care provider will talk about any underlying conditions you have. How does this affect my baby? Early and consistent prenatal care increases the  chance that your baby will be healthy during pregnancy. Prenatal care lowers the risk that your baby will be:  Born early (prematurely).  Smaller than expected at birth (small for gestational age). What can I expect at the first prenatal care visit? Your first prenatal care visit will likely be the longest. You should schedule your first prenatal care visit as soon as you know that you are pregnant. Your first visit is a good time to talk about any questions or concerns you have about pregnancy. At your visit, you and your health care provider will talk about:  Your medical history, including: ? Any past pregnancies. ? Your family's medical history. ? The baby's father's medical history. ? Any long-term (chronic) health conditions you have and how you manage them. ? Any surgeries or procedures you have had. ? Any current over-the-counter or prescription medicines, herbs, or supplements you are taking.  Other factors that could pose a risk to your baby, including:  Your home setting and your stress levels, including: ? Exposure to abuse or violence. ? Household financial strain. ? Mental health conditions you have.  Your daily health habits, including diet and exercise. Your health care provider will also:  Measure your weight, height, and blood pressure.  Do a physical exam, including a pelvic and breast exam.  Perform blood tests and urine tests to check for: ? Urinary tract infection. ? Sexually transmitted infections (STIs). ? Low iron levels in your blood (anemia). ? Blood type and certain proteins on red blood cells (Rh antibodies). ? Infections and immunity to viruses, such as hepatitis B and rubella. ? HIV (human immunodeficiency virus).  Do an ultrasound to confirm your baby's growth and development and to help predict your estimated due date (EDD). This ultrasound is done with a probe that is inserted into the vagina (transvaginal ultrasound).  Discuss your options  for genetic screening.  Give you information about how to keep yourself and your baby healthy, including: ? Nutrition and taking vitamins. ? Physical activity. ? How to manage pregnancy symptoms such as nausea and vomiting (morning sickness). ? Infections and substances that may be harmful to your baby and how to avoid them. ? Food safety. ? Dental care. ? Working. ? Travel. ? Warning signs to watch for and when to call your health care provider. How often will I have prenatal care visits? After your first prenatal care visit, you will have regular visits throughout your pregnancy. The visit schedule is often as follows:  Up to week 28 of pregnancy: once every 4 weeks.  28-36 weeks: once every 2 weeks.  After 36 weeks: every week until delivery. Some women may have visits more or less often depending on any underlying health conditions and the health of the baby. Keep all follow-up and prenatal care visits as told by your health care provider. This is important. What happens during routine prenatal care visits? Your health care provider will:  Measure your weight and blood pressure.  Check for fetal heart sounds.  Measure the height of your uterus in your abdomen (fundal height). This may be measured starting around week 20 of pregnancy.  Check the position of your baby inside your uterus.  Ask questions about your diet, sleeping patterns, and whether you can feel the baby move.  Review warning signs to watch for and signs of labor.  Ask about any pregnancy symptoms you are having and how you are dealing with them. Symptoms may include: ? Headaches. ? Nausea and vomiting. ? Vaginal discharge. ? Swelling. ? Fatigue. ? Constipation. ? Any discomfort, including back or pelvic pain. Make a list of questions to ask your health care provider at your routine visits. What tests might I have during prenatal care visits? You may have blood, urine, and imaging tests throughout  your pregnancy, such as:  Urine tests to check for glucose, protein, or signs of infection.  Glucose tests to check for a form of diabetes that can develop during pregnancy (gestational diabetes mellitus). This is usually done around week 24 of pregnancy.  An ultrasound to check your baby's growth and development and to check for birth defects. This is usually done around week 20 of pregnancy.  A test to check for group B strep (GBS) infection. This is usually done around week 36 of pregnancy.  Genetic testing. This may include blood or imaging tests, such as an ultrasound. Some genetic tests are done during the first trimester and some are done during the second trimester. What else can I expect during prenatal care visits? Your health care provider may recommend getting certain vaccines during pregnancy. These may include:  A yearly flu shot (annual influenza vaccine). This is especially important if you will be pregnant during flu season.  Tdap (tetanus, diphtheria, pertussis) vaccine. Getting this vaccine during pregnancy can protect your baby from whooping cough (pertussis) after birth. This vaccine may be recommended between weeks 27 and 36 of pregnancy. Later in your pregnancy, your health care provider may give you information about:  Childbirth and breastfeeding classes.  Choosing a health care provider for your baby.  Umbilical cord banking.  Breastfeeding.  Birth control after your baby is born.  The hospital labor and delivery unit and how to tour it.  Registering at the hospital before you go into labor. Where to find more information  Office on Women's Health: LegalWarrants.gl  American Pregnancy Association: americanpregnancy.org  March of Dimes: marchofdimes.org Summary  Prenatal care helps you and your baby stay as healthy as possible during pregnancy.  Your first prenatal care visit will most likely be the longest.  You will have visits and tests  throughout your pregnancy to monitor your health and your baby's health.  Bring a list of questions to your visits to ask your health care provider.  Make sure to keep all follow-up and prenatal care visits with your health care provider. This information is not intended to replace advice given to you by your health care provider. Make sure you discuss any questions you have with your health care provider. Document Revised:  10/18/2018 Document Reviewed: 06/27/2017 Elsevier Patient Education  2020 [employer].   First Trimester of Pregnancy  The first trimester of pregnancy is from week 1 until the end of week 13 (months 1 through 3). During this time, your baby will begin to develop inside you. At 6-8 weeks, the eyes and face are formed, and the heartbeat can be seen on ultrasound. At the end of 12 weeks, all the baby's organs are formed. Prenatal care is all the medical care you receive before the birth of your baby. Make sure you get good prenatal care and follow all of your doctor's instructions. Follow these instructions at home: Medicines  Take over-the-counter and prescription medicines only as told by your doctor. Some medicines are safe and some medicines are not safe during pregnancy.  Take a prenatal vitamin that contains at least 600 micrograms (mcg) of folic acid.  If you have trouble pooping (constipation), take medicine that will make your stool soft (stool softener) if your doctor approves. Eating and drinking   Eat regular, healthy meals.  Your doctor will tell you the amount of weight gain that is right for you.  Avoid raw meat and uncooked cheese.  If you feel sick to your stomach (nauseous) or throw up (vomit): ? Eat 4 or 5 small meals a day instead of 3 large meals. ? Try eating a few soda crackers. ? Drink liquids between meals instead of during meals.  To prevent constipation: ? Eat foods that are high in fiber, like fresh fruits and vegetables, whole  grains, and beans. ? Drink enough fluids to keep your pee (urine) clear or pale yellow. Activity  Exercise only as told by your doctor. Stop exercising if you have cramps or pain in your lower belly (abdomen) or low back.  Do not exercise if it is too hot, too humid, or if you are in a place of great height (high altitude).  Try to avoid standing for long periods of time. Move your legs often if you must stand in one place for a long time.  Avoid heavy lifting.  Wear low-heeled shoes. Sit and stand up straight.  You can have sex unless your doctor tells you not to. Relieving pain and discomfort  Wear a good support bra if your breasts are sore.  Take warm water baths (sitz baths) to soothe pain or discomfort caused by hemorrhoids. Use hemorrhoid cream if your doctor says it is okay.  Rest with your legs raised if you have leg cramps or low back pain.  If you have puffy, bulging veins (varicose veins) in your legs: ? Wear support hose or compression stockings as told by your doctor. ? Raise (elevate) your feet for 15 minutes, 3-4 times a day. ? Limit salt in your food. Prenatal care  Schedule your prenatal visits by the twelfth week of pregnancy.  Write down your questions. Take them to your prenatal visits.  Keep all your prenatal visits as told by your doctor. This is important. Safety  Wear your seat belt at all times when driving.  Make a list of emergency phone numbers. The list should include numbers for family, friends, the hospital, and police and fire departments. General instructions  Ask your doctor for a referral to a local prenatal class. Begin classes no later than at the start of month 6 of your pregnancy.  Ask for help if you need counseling or if you need help with nutrition. Your doctor can give you advice or tell  you where to go for help.  Do not use hot tubs, steam rooms, or saunas.  Do not douche or use tampons or scented sanitary pads.  Do not  cross your legs for long periods of time.  Avoid all herbs and alcohol. Avoid drugs that are not approved by your doctor.  Do not use any tobacco products, including cigarettes, chewing tobacco, and electronic cigarettes. If you need help quitting, ask your doctor. You may get counseling or other support to help you quit.  Avoid cat litter boxes and soil used by cats. These carry germs that can cause birth defects in the baby and can cause a loss of your baby (miscarriage) or stillbirth.  Visit your dentist. At home, brush your teeth with a soft toothbrush. Be gentle when you floss. Contact a doctor if:  You are dizzy.  You have mild cramps or pressure in your lower belly.  You have a nagging pain in your belly area.  You continue to feel sick to your stomach, you throw up, or you have watery poop (diarrhea).  You have a bad smelling fluid coming from your vagina.  You have pain when you pee (urinate).  You have increased puffiness (swelling) in your face, hands, legs, or ankles. Get help right away if:  You have a fever.  You are leaking fluid from your vagina.  You have spotting or bleeding from your vagina.  You have very bad belly cramping or pain.  You gain or lose weight rapidly.  You throw up blood. It may look like coffee grounds.  You are around people who have [place] measles, fifth disease, or chickenpox.  You have a very bad headache.  You have shortness of breath.  You have any kind of trauma, such as from a fall or a car accident. Summary  The first trimester of pregnancy is from week 1 until the end of week 13 (months 1 through 3).  To take care of yourself and your unborn baby, you will need to eat healthy meals, take medicines only if your doctor tells you to do so, and do activities that are safe for you and your baby.  Keep all follow-up visits as told by your doctor. This is important as your doctor will have to ensure that your baby is healthy  and growing well. This information is not intended to replace advice given to you by your health care provider. Make sure you discuss any questions you have with your health care provider. Document Revised: 10/19/2018 Document Reviewed: 07/06/2016 Elsevier Patient Education  2020 [employer].

## 2019-11-30 NOTE — Progress Notes (Signed)
I have seen, interviewed, and examined the patient in conjunction with the [employer] Nurse Practitioner student and affirm the diagnosis and management plan.   Gunnar Bulla, CNM [facility], [facility] 11/30/19 12:47 PM

## 2019-11-30 NOTE — Progress Notes (Signed)
NEW OB HISTORY AND PHYSICAL  SUBJECTIVE:       Tamara Mills is a 29 y.o. G29P1001 female, Patient's last menstrual period was 09/13/2019 (exact date)., Estimated Date of Delivery: 06/19/20, [redacted]w[redacted]d, presents today for establishment of Prenatal Care.  She has no unusual complaints and complains of nausea without vomiting that resolves with eating. Notes fatigue.  Denies headache, difficulty breathing or respiratory distress, chest pain, abdominal pain, constipation, excessive vaginal bleeding, dysuria, leg pain or swelling    Gynecologic History  Patient's last menstrual period was 09/13/2019 (exact date).  Contraception: none   Last Pap: 05/26/2018. Results were: normal  Obstetric History  OB History  Gravida Para Term Preterm AB Living  2 1 1     1   SAB TAB Ectopic Multiple Live Births        0 1    # Outcome Date GA Lbr Len/2nd Weight Sex Delivery Anes PTL Lv  2 Current           1 Term 09/10/17 [redacted]w[redacted]d / 04:02 7 lb 15 oz (3.6 kg) F Vag-Spont EPI  LIV    Past Medical History:  Diagnosis Date  . Acne   . Amenorrhea    Hx of  . Anxiety   . COVID-19   . Eating disorder    ? eating disorder in past ( pt states just running too much)  . Hand eczema   . Hypertension   . Seasonal allergies   . Underweight    Hx of    Past Surgical History:  Procedure Laterality Date  . WISDOM TOOTH EXTRACTION      Current Outpatient Medications on File Prior to Visit  Medication Sig Dispense Refill  . Prenatal Vit-Fe Fumarate-FA (PRENATAL MULTIVITAMIN) TABS tablet Take 1 tablet by mouth daily at 12 noon.    Marland Kitchen aspirin EC 81 MG tablet Take 1 tablet (81 mg total) by mouth daily. Take after 12 weeks for prevention of preeclampssia later in pregnancy (Patient not taking: Reported on 11/30/2019) 300 tablet 2   No current facility-administered medications on file prior to visit.    Allergies  Allergen Reactions  . Ortho Tri-Cyclen [Norgestimate-Eth Estradiol]     nausea  . Nickel Rash     Social History   Socioeconomic History  . Marital status: Married    Spouse name: Not on file  . Number of children: Not on file  . Years of education: Not on file  . Highest education level: Not on file  Occupational History  . Occupation: Pharmacist  Tobacco Use  . Smoking status: Never Smoker  . Smokeless tobacco: Never Used  Substance and Sexual Activity  . Alcohol use: Not Currently    Comment: occasional  . Drug use: No  . Sexual activity: Yes    Partners: Male    Birth control/protection: None  Other Topics Concern  . Not on file  Social History Narrative  . Not on file   Social Determinants of Health   Financial Resource Strain:   . Difficulty of Paying Living Expenses:   Food Insecurity:   . Worried About Charity fundraiser in the Last Year:   . Arboriculturist in the Last Year:   Transportation Needs:   . Film/video editor (Medical):   Marland Kitchen Lack of Transportation (Non-Medical):   Physical Activity:   . Days of Exercise per Week:   . Minutes of Exercise per Session:   Stress:   . Feeling of Stress :  Social Connections:   . Frequency of Communication with Friends and Family:   . Frequency of Social Gatherings with Friends and Family:   . Attends Religious Services:   . Active Member of Clubs or Organizations:   . Attends Banker Meetings:   Marland Kitchen Marital Status:   Intimate Partner Violence:   . Fear of Current or Ex-Partner:   . Emotionally Abused:   Marland Kitchen Physically Abused:   . Sexually Abused:     Family History  Problem Relation Age of Onset  . Crohn's disease Brother   . Ulcerative colitis Brother   . Sudden Cardiac Death Brother   . Hypertension Mother        had also preeclampsia  . Diabetes Maternal Grandmother        Type 1  . Cancer Paternal Grandmother 7       breast  . Breast cancer Paternal Grandmother   . Heart failure Paternal Grandfather        heart disease  . Cancer Paternal Aunt 40       breast  . Breast  cancer Paternal Aunt   . Ovarian cancer Neg Hx   . Colon cancer Neg Hx   . Esophageal cancer Neg Hx   . Rectal cancer Neg Hx   . Stomach cancer Neg Hx     The following portions of the patient's history were reviewed and updated as appropriate: allergies, current medications, past OB history, past medical history, past surgical history, past family history, past social history, and problem list.  Review of Systems:  ROS negative except as noted above. Information obtained from patient.   OBJECTIVE:  BP 113/73   Pulse (!) 57   Wt 148 lb 6 oz (67.3 kg)   LMP 09/13/2019 (Exact Date)   BMI 24.32 kg/m   Initial Physical Exam (New OB)  GENERAL APPEARANCE: alert, well appearing, in no apparent distress, oriented to person, place and time  HEAD: normocephalic, atraumatic  MOUTH: mucous membranes moist, pharynx normal without lesions and dental hygiene good  THYROID: no thyromegaly or masses present  BREASTS: no masses noted, no significant tenderness, no palpable axillary nodes, no skin changes, inverted nipples, scabbed blister to right nipple from "niplette".  LUNGS: clear to auscultation, no wheezes, rales or rhonchi, symmetric air entry  HEART: regular rate and rhythm, no murmurs  ABDOMEN: soft, nontender, nondistended, no abnormal masses, no epigastric pain, fundus soft, nontender 11 weeks size and FHT present  EXTREMITIES: no redness or tenderness in the calves or thighs, no edema SKIN: normal coloration and turgor, no rashes  LYMPH NODES: no adenopathy palpable  NEUROLOGIC: alert, oriented, normal speech, no focal findings or movement disorder noted  PELVIC EXAM EXTERNAL GENITALIA: normal appearing vulva with no masses, tenderness or lesions  UTERUS: gravid and consistent with 11 weeks  ADNEXA: no masses palpable and nontender  Pap Deferred.  ASSESSMENT:  Normal pregnancy  History of gestational hypertension  Chronic hypertension affecting  pregnancy.  Nausea in pregnancy  Fatigue in first trimester in pregnancy.  PLAN:  Prenatal care reviewed with patient.   Labs today: See orders  Desires Panorama for genetic screening. Will reach out to company to discuss cost.  Encouraged to continue with routine preventative care.  Start Aspirin 81 mg at 12 weeks.  Reviewed red flags and when to call the office.    RTC x 4 weeks for ROB or sooner if needed   Glorious Peach RN [facility] 11/30/19 10:03  AM

## 2019-12-01 LAB — COMPREHENSIVE METABOLIC PANEL
ALT: 8 IU/L (ref 0–32)
AST: 8 IU/L (ref 0–40)
Albumin/Globulin Ratio: 2 (ref 1.2–2.2)
Albumin: 4.3 g/dL (ref 3.9–5.0)
Alkaline Phosphatase: 65 IU/L (ref 48–121)
BUN/Creatinine Ratio: 18 (ref 9–23)
BUN: 8 mg/dL (ref 6–20)
Bilirubin Total: 0.8 mg/dL (ref 0.0–1.2)
CO2: 20 mmol/L (ref 20–29)
Calcium: 9.1 mg/dL (ref 8.7–10.2)
Chloride: 103 mmol/L (ref 96–106)
Creatinine, Ser: 0.45 mg/dL — ABNORMAL LOW (ref 0.57–1.00)
GFR calc Af Amer: 155 mL/min/{1.73_m2} (ref >59)
GFR calc non Af Amer: 135 mL/min/{1.73_m2} (ref >59)
Globulin, Total: 2.2 g/dL (ref 1.5–4.5)
Glucose: 79 mg/dL (ref 65–99)
Potassium: 4.4 mmol/L (ref 3.5–5.2)
Sodium: 135 mmol/L (ref 134–144)
Total Protein: 6.5 g/dL (ref 6.0–8.5)

## 2019-12-01 LAB — URIC ACID: Uric Acid: 2.7 mg/dL (ref 2.6–6.2)

## 2019-12-01 LAB — CBC
Hematocrit: 38.8 % (ref 34.0–46.6)
Hemoglobin: 12.9 g/dL (ref 11.1–15.9)
MCH: 29.8 pg (ref 26.6–33.0)
MCHC: 33.2 g/dL (ref 31.5–35.7)
MCV: 90 fL (ref 79–97)
Platelets: 246 10*3/uL (ref 150–450)
RBC: 4.33 x10E6/uL (ref 3.77–5.28)
RDW: 13.1 % (ref 11.7–15.4)
WBC: 6.4 10*3/uL (ref 3.4–10.8)

## 2019-12-06 ENCOUNTER — Encounter: Payer: Self-pay | Admitting: Family Medicine

## 2019-12-26 ENCOUNTER — Ambulatory Visit (INDEPENDENT_AMBULATORY_CARE_PROVIDER_SITE_OTHER): Payer: Managed Care, Other (non HMO) | Admitting: Certified Nurse Midwife

## 2019-12-26 ENCOUNTER — Other Ambulatory Visit: Payer: Self-pay

## 2019-12-26 VITALS — BP 117/79 | HR 62 | Wt 148.1 lb

## 2019-12-26 DIAGNOSIS — Z3A14 14 weeks gestation of pregnancy: Secondary | ICD-10-CM

## 2019-12-26 NOTE — Patient Instructions (Signed)

## 2019-12-26 NOTE — Progress Notes (Signed)
ROB doing well. Pt continues to have some fatigue and mild nausea that she declines medication for states it is manageable . She questions weather she will need the additional antenatal testing for chronic hypertesion because she states she stopped the BP medicine and has been off for more than 1 yr. Pt informed that we will discuss with MD at our high risk meeting. She verbalizes and agrees to plan . She is agreeable to taking the 81 mg asprin. Follow up 4 wk for anatomy u/s and ROB with Marcelino Duster.   Doreene Burke, CNM

## 2020-01-29 ENCOUNTER — Other Ambulatory Visit: Payer: Self-pay | Admitting: Certified Nurse Midwife

## 2020-01-29 DIAGNOSIS — Z3492 Encounter for supervision of normal pregnancy, unspecified, second trimester: Secondary | ICD-10-CM

## 2020-01-31 ENCOUNTER — Ambulatory Visit (INDEPENDENT_AMBULATORY_CARE_PROVIDER_SITE_OTHER): Payer: Managed Care, Other (non HMO) | Admitting: Certified Nurse Midwife

## 2020-01-31 ENCOUNTER — Telehealth: Payer: Self-pay | Admitting: Certified Nurse Midwife

## 2020-01-31 ENCOUNTER — Ambulatory Visit (INDEPENDENT_AMBULATORY_CARE_PROVIDER_SITE_OTHER): Payer: Managed Care, Other (non HMO)

## 2020-01-31 ENCOUNTER — Other Ambulatory Visit: Payer: Self-pay

## 2020-01-31 VITALS — BP 125/75 | HR 63 | Wt 155.1 lb

## 2020-01-31 DIAGNOSIS — Z3492 Encounter for supervision of normal pregnancy, unspecified, second trimester: Secondary | ICD-10-CM | POA: Diagnosis not present

## 2020-01-31 DIAGNOSIS — Z3A20 20 weeks gestation of pregnancy: Secondary | ICD-10-CM

## 2020-01-31 DIAGNOSIS — Z3482 Encounter for supervision of other normal pregnancy, second trimester: Secondary | ICD-10-CM

## 2020-01-31 DIAGNOSIS — O283 Abnormal ultrasonic finding on antenatal screening of mother: Secondary | ICD-10-CM

## 2020-01-31 DIAGNOSIS — Z1379 Encounter for other screening for genetic and chromosomal anomalies: Secondary | ICD-10-CM

## 2020-01-31 LAB — POCT URINALYSIS DIPSTICK OB
Bilirubin, UA: NEGATIVE
Blood, UA: NEGATIVE
Glucose, UA: NEGATIVE
Ketones, UA: NEGATIVE
Leukocytes, UA: NEGATIVE
Nitrite, UA: NEGATIVE
POC,PROTEIN,UA: NEGATIVE
Spec Grav, UA: <=1.005 — AB (ref 1.010–1.025)
Urobilinogen, UA: 0.2 E.U./dL
pH, UA: 6 (ref 5.0–8.0)

## 2020-01-31 NOTE — Telephone Encounter (Signed)
Pt called in and stated that panorama genetic. I told the pt that I will need to send a message that I'm not familiar with what type of appt type that genetic test is. The pt also said something about her second trimester  Blood work she said it was a lab , I told her I would send it with this message, that an order will have to be placed. The pt verbally understood. Please advise

## 2020-01-31 NOTE — Patient Instructions (Signed)
Round Ligament Pain  The round ligament is a cord of muscle and tissue that helps support the uterus. It can become a source of pain during pregnancy if it becomes stretched or twisted as the baby grows. The pain usually begins in the second trimester (13-28 weeks) of pregnancy, and it can come and go until the baby is delivered. It is not a serious problem, and it does not cause harm to the baby. Round ligament pain is usually a short, sharp, and pinching pain, but it can also be a dull, lingering, and aching pain. The pain is felt in the lower side of the abdomen or in the groin. It usually starts deep in the groin and moves up to the outside of the hip area. The pain may occur when you:  Suddenly change position, such as quickly going from a sitting to standing position.  Roll over in bed.  Cough or sneeze.  Do physical activity. Follow these instructions at home:   Watch your condition for any changes.  When the pain starts, relax. Then try any of these methods to help with the pain: ? Sitting down. ? Flexing your knees up to your abdomen. ? Lying on your side with one pillow under your abdomen and another pillow between your legs. ? Sitting in a warm bath for 15-20 minutes or until the pain goes away.  Take over-the-counter and prescription medicines only as told by your health care provider.  Move slowly when you sit down or stand up.  Avoid long walks if they cause pain.  Stop or reduce your physical activities if they cause pain.  Keep all follow-up visits as told by your health care provider. This is important. Contact a health care provider if:  Your pain does not go away with treatment.  You feel pain in your back that you did not have before.  Your medicine is not helping. Get help right away if:  You have a fever or chills.  You develop uterine contractions.  You have vaginal bleeding.  You have nausea or vomiting.  You have diarrhea.  You have pain  when you urinate. Summary  Round ligament pain is felt in the lower abdomen or groin. It is usually a short, sharp, and pinching pain. It can also be a dull, lingering, and aching pain.  This pain usually begins in the second trimester (13-28 weeks). It occurs because the uterus is stretching with the growing baby, and it is not harmful to the baby.  You may notice the pain when you suddenly change position, when you cough or sneeze, or during physical activity.  Relaxing, flexing your knees to your abdomen, lying on one side, or taking a warm bath may help to get rid of the pain.  Get help from your health care provider if the pain does not go away or if you have vaginal bleeding, nausea, vomiting, diarrhea, or painful urination. This information is not intended to replace advice given to you by your health care provider. Make sure you discuss any questions you have with your health care provider. Document Revised: 12/14/2017 Document Reviewed: 12/14/2017 Elsevier Patient Education  [place].   Back Pain in Pregnancy Back pain during pregnancy is common. Back pain may be caused by several factors that are related to changes during your pregnancy. Follow these instructions at home: Managing pain, stiffness, and swelling      If directed, for sudden (acute) back pain, put ice on the painful area. ?  Put ice in a plastic bag. ? Place a towel between your skin and the bag. ? Leave the ice on for 20 minutes, 2-3 times per day.  If directed, apply heat to the affected area before you exercise. Use the heat source that your health care provider recommends, such as a moist heat pack or a heating pad. ? Place a towel between your skin and the heat source. ? Leave the heat on for 20-30 minutes. ? Remove the heat if your skin turns bright red. This is especially important if you are unable to feel pain, heat, or cold. You may have a greater risk of getting burned.  If directed,  massage the affected area. Activity  Exercise as told by your health care provider. Gentle exercise is the best way to prevent or manage back pain.  Listen to your body when lifting. If lifting hurts, ask for help or bend your knees. This uses your leg muscles instead of your back muscles.  Squat down when picking up something from the floor. Do not bend over.  Only use bed rest for short periods as told by your health care provider. Bed rest should only be used for the most severe episodes of back pain. Standing, sitting, and lying down  Do not stand in one place for long periods of time.  Use good posture when sitting. Make sure your head rests over your shoulders and is not hanging forward. Use a pillow on your lower back if necessary.  Try sleeping on your side, preferably the left side, with a pregnancy support pillow or 1-2 regular pillows between your legs. ? If you have back pain after a night's rest, your bed may be too soft. ? A firm mattress may provide more support for your back during pregnancy. General instructions  Do not wear high heels.  Eat a healthy diet. Try to gain weight within your health care provider's recommendations.  Use a maternity girdle, elastic sling, or back brace as told by your health care provider.  Take over-the-counter and prescription medicines only as told by your health care provider.  Work with a physical therapist or massage therapist to find ways to manage back pain. Acupuncture or massage therapy may be helpful.  Keep all follow-up visits as told by your health care provider. This is important. Contact a health care provider if:  Your back pain interferes with your daily activities.  You have increasing pain in other parts of your body. Get help right away if:  You develop numbness, tingling, weakness, or problems with the use of your arms or legs.  You develop severe back pain that is not controlled with medicine.  You have a  change in bowel or bladder control.  You develop shortness of breath, dizziness, or you faint.  You develop nausea, vomiting, or sweating.  You have back pain that is a rhythmic, cramping pain similar to labor pains. Labor pain is usually 1-2 minutes apart, lasts for about 1 minute, and involves a bearing down feeling or pressure in your pelvis.  You have back pain and your water breaks or you have vaginal bleeding.  You have back pain or numbness that travels down your leg.  Your back pain developed after you fell.  You develop pain on one side of your back.  You see blood in your urine.  You develop skin blisters in the area of your back pain. Summary  Back pain may be caused by several factors that are  related to changes during your pregnancy.  Follow instructions as told by your health care provider for managing pain, stiffness, and swelling.  Exercise as told by your health care provider. Gentle exercise is the best way to prevent or manage back pain.  Take over-the-counter and prescription medicines only as told by your health care provider.  Keep all follow-up visits as told by your health care provider. This is important. This information is not intended to replace advice given to you by your health care provider. Make sure you discuss any questions you have with your health care provider. Document Revised: 10/17/2018 Document Reviewed: 12/14/2017 Elsevier Patient Education  2020 [employer].    Second Trimester of Pregnancy  The second trimester is from week 14 through week 27 (month 4 through 6). This is often the time in pregnancy that you feel your best. Often times, morning sickness has lessened or quit. You may have more energy, and you may get hungry more often. Your unborn baby is growing rapidly. At the end of the sixth month, he or she is about 9 inches long and weighs about 1 pounds. You will likely feel the baby move between 18 and 20 weeks of  pregnancy. Follow these instructions at home: Medicines  Take over-the-counter and prescription medicines only as told by your doctor. Some medicines are safe and some medicines are not safe during pregnancy.  Take a prenatal vitamin that contains at least 600 micrograms (mcg) of folic acid.  If you have trouble pooping (constipation), take medicine that will make your stool soft (stool softener) if your doctor approves. Eating and drinking   Eat regular, healthy meals.  Avoid raw meat and uncooked cheese.  If you get low calcium from the food you eat, talk to your doctor about taking a daily calcium supplement.  Avoid foods that are high in fat and sugars, such as fried and sweet foods.  If you feel sick to your stomach (nauseous) or throw up (vomit): ? Eat 4 or 5 small meals a day instead of 3 large meals. ? Try eating a few soda crackers. ? Drink liquids between meals instead of during meals.  To prevent constipation: ? Eat foods that are high in fiber, like fresh fruits and vegetables, whole grains, and beans. ? Drink enough fluids to keep your pee (urine) clear or pale yellow. Activity  Exercise only as told by your doctor. Stop exercising if you start to have cramps.  Do not exercise if it is too hot, too humid, or if you are in a place of great height (high altitude).  Avoid heavy lifting.  Wear low-heeled shoes. Sit and stand up straight.  You can continue to have sex unless your doctor tells you not to. Relieving pain and discomfort  Wear a good support bra if your breasts are tender.  Take warm water baths (sitz baths) to soothe pain or discomfort caused by hemorrhoids. Use hemorrhoid cream if your doctor approves.  Rest with your legs raised if you have leg cramps or low back pain.  If you develop puffy, bulging veins (varicose veins) in your legs: ? Wear support hose or compression stockings as told by your doctor. ? Raise (elevate) your feet for 15  minutes, 3-4 times a day. ? Limit salt in your food. Prenatal care  Write down your questions. Take them to your prenatal visits.  Keep all your prenatal visits as told by your doctor. This is important. Safety  Wear your seat belt  when driving.  Make a list of emergency phone numbers, including numbers for family, friends, the hospital, and police and fire departments. General instructions  Ask your doctor about the right foods to eat or for help finding a counselor, if you need these services.  Ask your doctor about local prenatal classes. Begin classes before month 6 of your pregnancy.  Do not use hot tubs, steam rooms, or saunas.  Do not douche or use tampons or scented sanitary pads.  Do not cross your legs for long periods of time.  Visit your dentist if you have not done so. Use a soft toothbrush to brush your teeth. Floss gently.  Avoid all smoking, herbs, and alcohol. Avoid drugs that are not approved by your doctor.  Do not use any products that contain nicotine or tobacco, such as cigarettes and e-cigarettes. If you need help quitting, ask your doctor.  Avoid cat litter boxes and soil used by cats. These carry germs that can cause birth defects in the baby and can cause a loss of your baby (miscarriage) or stillbirth. Contact a doctor if:  You have mild cramps or pressure in your lower belly.  You have pain when you pee (urinate).  You have bad smelling fluid coming from your vagina.  You continue to feel sick to your stomach (nauseous), throw up (vomit), or have watery poop (diarrhea).  You have a nagging pain in your belly area.  You feel dizzy. Get help right away if:  You have a fever.  You are leaking fluid from your vagina.  You have spotting or bleeding from your vagina.  You have severe belly cramping or pain.  You lose or gain weight rapidly.  You have trouble catching your breath and have chest pain.  You notice sudden or extreme  puffiness (swelling) of your face, hands, ankles, feet, or legs.  You have not felt the baby move in over an hour.  You have severe headaches that do not go away when you take medicine.  You have trouble seeing. Summary  The second trimester is from week 14 through week 27 (months 4 through 6). This is often the time in pregnancy that you feel your best.  To take care of yourself and your unborn baby, you will need to eat healthy meals, take medicines only if your doctor tells you to do so, and do activities that are safe for you and your baby.  Call your doctor if you get sick or if you notice anything unusual about your pregnancy. Also, call your doctor if you need help with the right food to eat, or if you want to know what activities are safe for you. This information is not intended to replace advice given to you by your health care provider. Make sure you discuss any questions you have with your health care provider. Document Revised: 10/20/2018 Document Reviewed: 08/03/2016 Elsevier Patient Education  2020 [employer].

## 2020-01-31 NOTE — Progress Notes (Signed)
ROB-Feeling "awesome" in the second trimester. Anatomy scan today normal except for echogenic cardiac foci. Ultrasound findings discussed with patient and handout provided, verbalized understanding. Panorama/cell free DNA testing offered, patient will check with insurance regarding coverage and then contact the office. Anticipatory guidance regarding course of prenatal care. Reviewed red flag symptoms and when to call. RTC x 4 weeks for ROB or sooner if needed.   ULTRASOUND REPORT  Location: [facility] Date of Service: 01/31/2020   Indications:Anatomy Ultrasound Findings:  Singleton intrauterine pregnancy is visualized with FHR at 145 BPM. Biometrics give an (U/S) Gestational age of [redacted]w[redacted]d and an (U/S) EDD of 06/09/2020; this does not correlates with the clinically established Estimated Date of Delivery: 06/19/20 .  Fetal presentation is Variable.  EFW: 418 g ( 15 oz). Fetal Percentile  Placenta: posterior. Grade: 1 AFI: subjectively normal.  Anatomic survey is complete. Echogenic foci with-in the fetal heart measuring 2 mm.  Gender - female.    Right Ovary is normal in appearance. Left Ovary is normal appearance. Survey of the adnexa demonstrates no adnexal masses. There is no free peritoneal fluid in the cul de sac.  Impression: 1. [redacted]w[redacted]d Viable Singleton Intrauterine pregnancy by U/S. 2. (U/S) EDD is consistent with Clinically established Estimated Date of Delivery: 06/19/20 . 3. Echogenic foci with-in fetal heart as described above.  Recommendations: 1.Clinical correlation with the patient's History and Physical Exam.

## 2020-01-31 NOTE — Telephone Encounter (Signed)
She called in and said she wants to do a  panorama genetic testing. I told the pt I will have to send a message because I do not know if that is a lab visit or an appt with a provider. Then she said that she wants to go ahead and do her second trimester blood work. I told her that an order will have to placed for both. But yes she wants to do both in one visit. Sorry.

## 2020-01-31 NOTE — Telephone Encounter (Signed)
Please reread your message. What does the patient need a lab visit? She would like her second trimester blood work and panorama collected at the same time? JML

## 2020-02-01 NOTE — Telephone Encounter (Signed)
Left HIPPA approved message on identified line asking patient to call back or contact via MyChart.    Tamara Mills, CNM [facility], [facility] 02/01/20 4:42 PM

## 2020-02-07 ENCOUNTER — Encounter: Payer: Self-pay | Admitting: Certified Nurse Midwife

## 2020-02-07 ENCOUNTER — Telehealth: Payer: Self-pay

## 2020-02-07 ENCOUNTER — Ambulatory Visit: Payer: Managed Care, Other (non HMO)

## 2020-02-07 DIAGNOSIS — O283 Abnormal ultrasonic finding on antenatal screening of mother: Secondary | ICD-10-CM

## 2020-02-07 DIAGNOSIS — O10919 Unspecified pre-existing hypertension complicating pregnancy, unspecified trimester: Secondary | ICD-10-CM

## 2020-02-07 DIAGNOSIS — Z3A20 20 weeks gestation of pregnancy: Secondary | ICD-10-CM

## 2020-02-07 NOTE — Telephone Encounter (Signed)
[facility]  Per JML pt needs panorama, CMP, uric acid(hx of HTN).  Pt can be placed on nurse schedule today or tomorrow.

## 2020-02-08 LAB — COMPREHENSIVE METABOLIC PANEL
ALT: 11 IU/L (ref 0–32)
AST: 16 IU/L (ref 0–40)
Albumin/Globulin Ratio: 1.8 (ref 1.2–2.2)
Albumin: 4.2 g/dL (ref 3.9–5.0)
Alkaline Phosphatase: 70 IU/L (ref 48–121)
BUN/Creatinine Ratio: 15 (ref 9–23)
BUN: 7 mg/dL (ref 6–20)
Bilirubin Total: 0.5 mg/dL (ref 0.0–1.2)
CO2: 21 mmol/L (ref 20–29)
Calcium: 9.6 mg/dL (ref 8.7–10.2)
Chloride: 103 mmol/L (ref 96–106)
Creatinine, Ser: 0.46 mg/dL — ABNORMAL LOW (ref 0.57–1.00)
GFR calc Af Amer: 154 mL/min/{1.73_m2} (ref >59)
GFR calc non Af Amer: 134 mL/min/{1.73_m2} (ref >59)
Globulin, Total: 2.3 g/dL (ref 1.5–4.5)
Glucose: 65 mg/dL (ref 65–99)
Potassium: 4 mmol/L (ref 3.5–5.2)
Sodium: 139 mmol/L (ref 134–144)
Total Protein: 6.5 g/dL (ref 6.0–8.5)

## 2020-02-08 LAB — URIC ACID: Uric Acid: 3.2 mg/dL (ref 2.6–6.2)

## 2020-02-08 NOTE — Telephone Encounter (Signed)
Pt aware. Appt was made for 7/29.

## 2020-02-15 ENCOUNTER — Telehealth: Payer: Self-pay

## 2020-02-15 NOTE — Telephone Encounter (Signed)
mychart message sent to patient

## 2020-03-04 ENCOUNTER — Ambulatory Visit (INDEPENDENT_AMBULATORY_CARE_PROVIDER_SITE_OTHER): Payer: Managed Care, Other (non HMO) | Admitting: Certified Nurse Midwife

## 2020-03-04 ENCOUNTER — Other Ambulatory Visit: Payer: Self-pay

## 2020-03-04 ENCOUNTER — Encounter: Payer: Self-pay | Admitting: Certified Nurse Midwife

## 2020-03-04 VITALS — BP 129/78 | HR 74 | Wt 164.0 lb

## 2020-03-04 DIAGNOSIS — Z3482 Encounter for supervision of other normal pregnancy, second trimester: Secondary | ICD-10-CM

## 2020-03-04 DIAGNOSIS — O358XX0 Maternal care for other (suspected) fetal abnormality and damage, not applicable or unspecified: Secondary | ICD-10-CM

## 2020-03-04 DIAGNOSIS — Z3A24 24 weeks gestation of pregnancy: Secondary | ICD-10-CM

## 2020-03-04 DIAGNOSIS — IMO0001 Reserved for inherently not codable concepts without codable children: Secondary | ICD-10-CM

## 2020-03-04 LAB — POCT URINALYSIS DIPSTICK OB
Bilirubin, UA: NEGATIVE
Blood, UA: NEGATIVE
Glucose, UA: NEGATIVE
Ketones, UA: NEGATIVE
Leukocytes, UA: NEGATIVE
Nitrite, UA: NEGATIVE
POC,PROTEIN,UA: NEGATIVE
Spec Grav, UA: <=1.005 — AB (ref 1.010–1.025)
Urobilinogen, UA: 0.2 E.U./dL
pH, UA: 6 (ref 5.0–8.0)

## 2020-03-04 NOTE — Progress Notes (Signed)
ROB doing well. Feels good movement. Discussed repeat u/s at 28 wks for EIF , reviewed glucose screen next visit. She verbalizes and agrees to plan Return in about 4 weeks (around 04/01/2020).   Doreene Burke, CNM

## 2020-03-04 NOTE — Patient Instructions (Signed)
Glucose Tolerance Test During Pregnancy Why am I having this test? The glucose tolerance test (GTT) is done to check how your body processes sugar (glucose). This is one of several tests used to diagnose diabetes that develops during pregnancy (gestational diabetes mellitus). Gestational diabetes is a temporary form of diabetes that some women develop during pregnancy. It usually occurs during the second trimester of pregnancy and goes away after delivery. Testing (screening) for gestational diabetes usually occurs between 24 and 28 weeks of pregnancy. You may have the GTT test after having a 1-hour glucose screening test if the results from that test indicate that you may have gestational diabetes. You may also have this test if:  You have a history of gestational diabetes.  You have a history of giving birth to very large babies or have experienced repeated fetal loss (stillbirth).  You have signs and symptoms of diabetes, such as: ? Changes in your vision. ? Tingling or numbness in your hands or feet. ? Changes in hunger, thirst, and urination that are not otherwise explained by your pregnancy. What is being tested? This test measures the amount of glucose in your blood at different times during a period of 3 hours. This indicates how well your body is able to process glucose. What kind of sample is taken?  Blood samples are required for this test. They are usually collected by inserting a needle into a blood vessel. How do I prepare for this test?  For 3 days before your test, eat normally. Have plenty of carbohydrate-rich foods.  Follow instructions from your health care provider about: ? Eating or drinking restrictions on the day of the test. You may be asked to not eat or drink anything other than water (fast) starting 8-10 hours before the test. ? Changing or stopping your regular medicines. Some medicines may interfere with this test. Tell a health care provider about:  All  medicines you are taking, including vitamins, herbs, eye drops, creams, and over-the-counter medicines.  Any blood disorders you have.  Any surgeries you have had.  Any medical conditions you have. What happens during the test? First, your blood glucose will be measured. This is referred to as your fasting blood glucose, since you fasted before the test. Then, you will drink a glucose solution that contains a certain amount of glucose. Your blood glucose will be measured again 1, 2, and 3 hours after drinking the solution. This test takes about 3 hours to complete. You will need to stay at the testing location during this time. During the testing period:  Do not eat or drink anything other than the glucose solution.  Do not exercise.  Do not use any products that contain nicotine or tobacco, such as cigarettes and e-cigarettes. If you need help stopping, ask your health care provider. The testing procedure may vary among health care providers and hospitals. How are the results reported? Your results will be reported as milligrams of glucose per deciliter of blood (mg/dL) or millimoles per liter (mmol/L). Your health care provider will compare your results to normal ranges that were established after testing a large group of people (reference ranges). Reference ranges may vary among labs and hospitals. For this test, common reference ranges are:  Fasting: less than 95-105 mg/dL (5.3-5.8 mmol/L).  1 hour after drinking glucose: less than 180-190 mg/dL (10.0-10.5 mmol/L).  2 hours after drinking glucose: less than 155-165 mg/dL (8.6-9.2 mmol/L).  3 hours after drinking glucose: 140-145 mg/dL (7.8-8.1 mmol/L). What do the   results mean? Results within reference ranges are considered normal, meaning that your glucose levels are well-controlled. If two or more of your blood glucose levels are high, you may be diagnosed with gestational diabetes. If only one level is high, your health care  provider may suggest repeat testing or other tests to confirm a diagnosis. Talk with your health care provider about what your results mean. Questions to ask your health care provider Ask your health care provider, or the department that is doing the test:  When will my results be ready?  How will I get my results?  What are my treatment options?  What other tests do I need?  What are my next steps? Summary  The glucose tolerance test (GTT) is one of several tests used to diagnose diabetes that develops during pregnancy (gestational diabetes mellitus). Gestational diabetes is a temporary form of diabetes that some women develop during pregnancy.  You may have the GTT test after having a 1-hour glucose screening test if the results from that test indicate that you may have gestational diabetes. You may also have this test if you have any symptoms or risk factors for gestational diabetes.  Talk with your health care provider about what your results mean. This information is not intended to replace advice given to you by your health care provider. Make sure you discuss any questions you have with your health care provider. Document Revised: 10/19/2018 Document Reviewed: 02/07/2017 Elsevier Patient Education  2020 [employer] Inc[employer]  

## 2020-04-10 ENCOUNTER — Other Ambulatory Visit: Payer: Self-pay | Admitting: Certified Nurse Midwife

## 2020-04-10 ENCOUNTER — Encounter: Payer: Self-pay | Admitting: Certified Nurse Midwife

## 2020-04-10 ENCOUNTER — Other Ambulatory Visit: Payer: Self-pay

## 2020-04-10 ENCOUNTER — Ambulatory Visit (INDEPENDENT_AMBULATORY_CARE_PROVIDER_SITE_OTHER): Payer: Managed Care, Other (non HMO)

## 2020-04-10 ENCOUNTER — Ambulatory Visit (INDEPENDENT_AMBULATORY_CARE_PROVIDER_SITE_OTHER): Payer: Managed Care, Other (non HMO) | Admitting: Certified Nurse Midwife

## 2020-04-10 VITALS — BP 132/89 | HR 60 | Wt 165.5 lb

## 2020-04-10 DIAGNOSIS — O10919 Unspecified pre-existing hypertension complicating pregnancy, unspecified trimester: Secondary | ICD-10-CM

## 2020-04-10 DIAGNOSIS — Z3A30 30 weeks gestation of pregnancy: Secondary | ICD-10-CM

## 2020-04-10 DIAGNOSIS — IMO0001 Reserved for inherently not codable concepts without codable children: Secondary | ICD-10-CM

## 2020-04-10 DIAGNOSIS — O358XX0 Maternal care for other (suspected) fetal abnormality and damage, not applicable or unspecified: Secondary | ICD-10-CM

## 2020-04-10 DIAGNOSIS — Z09 Encounter for follow-up examination after completed treatment for conditions other than malignant neoplasm: Secondary | ICD-10-CM

## 2020-04-10 DIAGNOSIS — Z23 Encounter for immunization: Secondary | ICD-10-CM | POA: Diagnosis not present

## 2020-04-10 DIAGNOSIS — Z3483 Encounter for supervision of other normal pregnancy, third trimester: Secondary | ICD-10-CM

## 2020-04-10 LAB — POCT URINALYSIS DIPSTICK OB
Bilirubin, UA: NEGATIVE
Blood, UA: NEGATIVE
Glucose, UA: NEGATIVE
Ketones, UA: NEGATIVE
Leukocytes, UA: NEGATIVE
Nitrite, UA: NEGATIVE
POC,PROTEIN,UA: NEGATIVE
Spec Grav, UA: <=1.005 — AB (ref 1.010–1.025)
Urobilinogen, UA: 0.2 E.U./dL
pH, UA: 6.5 (ref 5.0–8.0)

## 2020-04-10 NOTE — Patient Instructions (Signed)
Fetal Movement Counts Patient Name: ________________________________________________ Patient Due Date: ____________________ What is a fetal movement count?  A fetal movement count is the number of times that you feel your baby move during a certain amount of time. This may also be called a fetal kick count. A fetal movement count is recommended for every pregnant woman. You may be asked to start counting fetal movements as early as week 28 of your pregnancy. Pay attention to when your baby is most active. You may notice your baby's sleep and wake cycles. You may also notice things that make your baby move more. You should do a fetal movement count:  When your baby is normally most active.  At the same time each day. A good time to count movements is while you are resting, after having something to eat and drink. How do I count fetal movements? 1. Find a quiet, comfortable area. Sit, or lie down on your side. 2. Write down the date, the start time and stop time, and the number of movements that you felt between those two times. Take this information with you to your health care visits. 3. Write down your start time when you feel the first movement. 4. Count kicks, flutters, swishes, rolls, and jabs. You should feel at least 10 movements. 5. You may stop counting after you have felt 10 movements, or if you have been counting for 2 hours. Write down the stop time. 6. If you do not feel 10 movements in 2 hours, contact your health care provider for further instructions. Your health care provider may want to do additional tests to assess your baby's well-being. Contact a health care provider if:  You feel fewer than 10 movements in 2 hours.  Your baby is not moving like he or she usually does. Date: ____________ Start time: ____________ Stop time: ____________ Movements: ____________ Date: ____________ Start time: ____________ Stop time: ____________ Movements: ____________ Date: ____________  Start time: ____________ Stop time: ____________ Movements: ____________ Date: ____________ Start time: ____________ Stop time: ____________ Movements: ____________ Date: ____________ Start time: ____________ Stop time: ____________ Movements: ____________ Date: ____________ Start time: ____________ Stop time: ____________ Movements: ____________ Date: ____________ Start time: ____________ Stop time: ____________ Movements: ____________ Date: ____________ Start time: ____________ Stop time: ____________ Movements: ____________ Date: ____________ Start time: ____________ Stop time: ____________ Movements: ____________ This information is not intended to replace advice given to you by your health care provider. Make sure you discuss any questions you have with your health care provider. Document Revised: 02/15/2019 Document Reviewed: 02/15/2019 Elsevier Patient Education  [place] of Pregnancy  The third trimester is from week 28 through week 40 (months 7 through 9). This trimester is when your unborn baby (fetus) is growing very fast. At the end of the ninth month, the unborn baby is about 20 inches in length. It weighs about 6-10 pounds. Follow these instructions at home: Medicines  Take over-the-counter and prescription medicines only as told by your doctor. Some medicines are safe and some medicines are not safe during pregnancy.  Take a prenatal vitamin that contains at least 600 micrograms (mcg) of folic acid.  If you have trouble pooping (constipation), take medicine that will make your stool soft (stool softener) if your doctor approves. Eating and drinking   Eat regular, healthy meals.  Avoid raw meat and uncooked cheese.  If you get low calcium from the food you eat, talk to your doctor about taking a daily calcium supplement.  Eat four or five small meals rather than three large meals a day.  Avoid foods that are high in fat and sugars, such as  fried and sweet foods.  To prevent constipation: ? Eat foods that are high in fiber, like fresh fruits and vegetables, whole grains, and beans. ? Drink enough fluids to keep your pee (urine) clear or pale yellow. Activity  Exercise only as told by your doctor. Stop exercising if you start to have cramps.  Avoid heavy lifting, wear low heels, and sit up straight.  Do not exercise if it is too hot, too humid, or if you are in a place of great height (high altitude).  You may continue to have sex unless your doctor tells you not to. Relieving pain and discomfort  Wear a good support bra if your breasts are tender.  Take frequent breaks and rest with your legs raised if you have leg cramps or low back pain.  Take warm water baths (sitz baths) to soothe pain or discomfort caused by hemorrhoids. Use hemorrhoid cream if your doctor approves.  If you develop puffy, bulging veins (varicose veins) in your legs: ? Wear support hose or compression stockings as told by your doctor. ? Raise (elevate) your feet for 15 minutes, 3-4 times a day. ? Limit salt in your food. Safety  Wear your seat belt when driving.  Make a list of emergency phone numbers, including numbers for family, friends, the hospital, and police and fire departments. Preparing for your baby's arrival To prepare for the arrival of your baby:  Take prenatal classes.  Practice driving to the hospital.  Visit the hospital and tour the maternity area.  Talk to your work about taking leave once the baby comes.  Pack your hospital bag.  Prepare the baby's room.  Go to your doctor visits.  Buy a rear-facing car seat. Learn how to install it in your car. General instructions  Do not use hot tubs, steam rooms, or saunas.  Do not use any products that contain nicotine or tobacco, such as cigarettes and e-cigarettes. If you need help quitting, ask your doctor.  Do not drink alcohol.  Do not douche or use tampons or  scented sanitary pads.  Do not cross your legs for long periods of time.  Do not travel for long distances unless you must. Only do so if your doctor says it is okay.  Visit your dentist if you have not gone during your pregnancy. Use a soft toothbrush to brush your teeth. Be gentle when you floss.  Avoid cat litter boxes and soil used by cats. These carry germs that can cause birth defects in the baby and can cause a loss of your baby (miscarriage) or stillbirth.  Keep all your prenatal visits as told by your doctor. This is important. Contact a doctor if:  You are not sure if you are in labor or if your water has broken.  You are dizzy.  You have mild cramps or pressure in your lower belly.  You have a nagging pain in your belly area.  You continue to feel sick to your stomach, you throw up, or you have watery poop.  You have bad smelling fluid coming from your vagina.  You have pain when you pee. Get help right away if:  You have a fever.  You are leaking fluid from your vagina.  You are spotting or bleeding from your vagina.  You have severe belly cramps or pain.  You   lose or gain weight quickly.  You have trouble catching your breath and have chest pain.  You notice sudden or extreme puffiness (swelling) of your face, hands, ankles, feet, or legs.  You have not felt the baby move in over an hour.  You have severe headaches that do not go away with medicine.  You have trouble seeing.  You are leaking, or you are having a gush of fluid, from your vagina before you are 37 weeks.  You have regular belly spasms (contractions) before you are 37 weeks. Summary  The third trimester is from week 28 through week 40 (months 7 through 9). This time is when your unborn baby is growing very fast.  Follow your doctor's advice about medicine, food, and activity.  Get ready for the arrival of your baby by taking prenatal classes, getting all the baby items ready,  preparing the baby's room, and visiting your doctor to be checked.  Get help right away if you are bleeding from your vagina, or you have chest pain and trouble catching your breath, or if you have not felt your baby move in over an hour. This information is not intended to replace advice given to you by your health care provider. Make sure you discuss any questions you have with your health care provider. Document Revised: 10/19/2018 Document Reviewed: 08/03/2016 Elsevier Patient Education  2020 Elsevier Inc.  

## 2020-04-10 NOTE — Progress Notes (Signed)
ROB-Doing well. TDaP and flu vaccines given today, see chart. Discussed COVID vaccine during pregnancy, patient awaiting recommendations regarding Moderna booster. Third trimester handouts provided. Breastfeeding education completed. 28 week and baseline PIH labs collected today, see orders. Patient request toxoplasmosis screening after eating very rare hamburger last month, see orders. Ultrasound today wnl except for EIF. Anticipatory guidance regarding course of prenatal care. Reviewed red flag symptoms and when to call. RTC x 2 weeks for ROB or sooner if needed.   ULTRASOUND REPORT  Location: [facility] Date of Service: 04/10/2020   Indications:growth/afi Findings:  [place] intrauterine pregnancy is visualized with FHR at 126 BPM. Fetal presentation is Cephalic.  Placenta: posterior. Grade: 1 AFI: 12 cm  Growth percentile is 56. EFW: 1637 g ( 3 lbs 10 oz)   Echogenic foci is still seen within fetal heart 3 mm in size.  Impression: 1. [redacted]w[redacted]d Viable [place] Intrauterine pregnancy previously established criteria. 2. Growth is 56 %ile.  AFI is 12 cm.  3. Echogenic foci still seen within fetal heart. Recommendations: 1.Clinical correlation with the patient's History and Physical Exam.

## 2020-04-11 LAB — PROTEIN / CREATININE RATIO, URINE
Creatinine, Urine: 53.6 mg/dL
Protein, Ur: 7.9 mg/dL
Protein/Creat Ratio: 147 mg/g creat (ref 0–200)

## 2020-04-11 LAB — CBC
Hematocrit: 40.1 % (ref 34.0–46.6)
Hemoglobin: 13.9 g/dL (ref 11.1–15.9)
MCH: 31.1 pg (ref 26.6–33.0)
MCHC: 34.7 g/dL (ref 31.5–35.7)
MCV: 90 fL (ref 79–97)
Platelets: 198 10*3/uL (ref 150–450)
RBC: 4.47 x10E6/uL (ref 3.77–5.28)
RDW: 13.5 % (ref 11.7–15.4)
WBC: 8.3 10*3/uL (ref 3.4–10.8)

## 2020-04-11 LAB — COMPREHENSIVE METABOLIC PANEL
ALT: 10 IU/L (ref 0–32)
AST: 19 IU/L (ref 0–40)
Albumin/Globulin Ratio: 2 (ref 1.2–2.2)
Albumin: 4 g/dL (ref 3.9–5.0)
Alkaline Phosphatase: 94 IU/L (ref 44–121)
BUN/Creatinine Ratio: 15 (ref 9–23)
BUN: 8 mg/dL (ref 6–20)
Bilirubin Total: 0.5 mg/dL (ref 0.0–1.2)
CO2: 20 mmol/L (ref 20–29)
Calcium: 9.2 mg/dL (ref 8.7–10.2)
Chloride: 102 mmol/L (ref 96–106)
Creatinine, Ser: 0.54 mg/dL — ABNORMAL LOW (ref 0.57–1.00)
GFR calc Af Amer: 146 mL/min/{1.73_m2} (ref >59)
GFR calc non Af Amer: 127 mL/min/{1.73_m2} (ref >59)
Globulin, Total: 2 g/dL (ref 1.5–4.5)
Glucose: 90 mg/dL (ref 65–99)
Potassium: 4.4 mmol/L (ref 3.5–5.2)
Sodium: 136 mmol/L (ref 134–144)
Total Protein: 6 g/dL (ref 6.0–8.5)

## 2020-04-11 LAB — URIC ACID: Uric Acid: 3.7 mg/dL (ref 2.6–6.2)

## 2020-04-11 LAB — TOXOPLASMA ANTIBODIES- IGG AND  IGM
Toxoplasma Antibody- IgM: <3 AU/mL (ref 0.0–7.9)
Toxoplasma IgG Ratio: <3 IU/mL (ref 0.0–7.1)

## 2020-04-11 LAB — RPR: RPR Ser Ql: NONREACTIVE

## 2020-04-11 LAB — GLUCOSE, 1 HOUR GESTATIONAL: Gestational Diabetes Screen: 84 mg/dL (ref 65–139)

## 2020-04-22 ENCOUNTER — Telehealth: Payer: Self-pay

## 2020-04-22 NOTE — Telephone Encounter (Signed)
Spoke to [employer] from "[employer]" in regards to pt message- Pt is requesting a breast pump however, a script is requested. Dawn stated she will send me a form to be filled out and to fax it back when completed. Pt aware.

## 2020-04-24 ENCOUNTER — Telehealth: Payer: Self-pay

## 2020-04-24 NOTE — Telephone Encounter (Signed)
mychart message sent

## 2020-05-07 ENCOUNTER — Other Ambulatory Visit: Payer: Self-pay

## 2020-05-07 ENCOUNTER — Encounter: Payer: Self-pay | Admitting: Certified Nurse Midwife

## 2020-05-07 ENCOUNTER — Ambulatory Visit (INDEPENDENT_AMBULATORY_CARE_PROVIDER_SITE_OTHER): Payer: Managed Care, Other (non HMO) | Admitting: Certified Nurse Midwife

## 2020-05-07 VITALS — BP 124/80 | HR 61 | Wt 172.0 lb

## 2020-05-07 DIAGNOSIS — Z3A33 33 weeks gestation of pregnancy: Secondary | ICD-10-CM

## 2020-05-07 LAB — POCT URINALYSIS DIPSTICK OB
Bilirubin, UA: NEGATIVE
Blood, UA: NEGATIVE
Glucose, UA: NEGATIVE
Ketones, UA: NEGATIVE
Leukocytes, UA: NEGATIVE
Nitrite, UA: NEGATIVE
POC,PROTEIN,UA: NEGATIVE
Spec Grav, UA: 1.01 (ref 1.010–1.025)
Urobilinogen, UA: 0.2 E.U./dL
pH, UA: 5 (ref 5.0–8.0)

## 2020-05-07 NOTE — Progress Notes (Signed)
ROB doing well. Feels good movement. Discussed GBS at next visit. NST next visit due to history chronic hypertension. She verbalizes and agrees. Follow up 2 wk with Marcelino Duster.   Doreene Burke, CNM

## 2020-05-07 NOTE — Patient Instructions (Signed)

## 2020-05-22 ENCOUNTER — Other Ambulatory Visit: Payer: Managed Care, Other (non HMO)

## 2020-05-22 ENCOUNTER — Other Ambulatory Visit: Payer: Self-pay

## 2020-05-22 ENCOUNTER — Ambulatory Visit (INDEPENDENT_AMBULATORY_CARE_PROVIDER_SITE_OTHER): Payer: Managed Care, Other (non HMO) | Admitting: Certified Nurse Midwife

## 2020-05-22 ENCOUNTER — Encounter: Payer: Self-pay | Admitting: Certified Nurse Midwife

## 2020-05-22 VITALS — BP 120/76 | HR 59 | Wt 175.0 lb

## 2020-05-22 DIAGNOSIS — Z3A36 36 weeks gestation of pregnancy: Secondary | ICD-10-CM | POA: Diagnosis not present

## 2020-05-22 DIAGNOSIS — Z3483 Encounter for supervision of other normal pregnancy, third trimester: Secondary | ICD-10-CM

## 2020-05-22 DIAGNOSIS — O10919 Unspecified pre-existing hypertension complicating pregnancy, unspecified trimester: Secondary | ICD-10-CM | POA: Diagnosis not present

## 2020-05-22 LAB — POCT URINALYSIS DIPSTICK OB
Bilirubin, UA: NEGATIVE
Blood, UA: NEGATIVE
Glucose, UA: NEGATIVE
Ketones, UA: NEGATIVE
Leukocytes, UA: NEGATIVE
Nitrite, UA: NEGATIVE
POC,PROTEIN,UA: NEGATIVE
Spec Grav, UA: 1.02 (ref 1.010–1.025)
Urobilinogen, UA: 0.2 E.U./dL
pH, UA: 6 (ref 5.0–8.0)

## 2020-05-22 NOTE — Patient Instructions (Addendum)
ACOG suggested the following approach for delivery of women with chronic hypertension [2]:  .?38+0 to 39+6 weeks of gestation for women not requiring medication .?37+0 to 39+0 weeks for women with hypertension controlled with medication .34+0 to 36+6 weeks for women with severe hypertension that is difficult to control The ranges allow for clinician judgment on a case-by-case basis, with consideration of factors such as steady-state levels of and trends in blood pressure, fetal growth and amniotic fluid volume, and cervical status.    Fetal Movement Counts Patient Name: ________________________________________________ Patient Due Date: ____________________ What is a fetal movement count?  A fetal movement count is the number of times that you feel your baby move during a certain amount of time. This may also be called a fetal kick count. A fetal movement count is recommended for every pregnant woman. You may be asked to start counting fetal movements as early as week 28 of your pregnancy. Pay attention to when your baby is most active. You may notice your baby's sleep and wake cycles. You may also notice things that make your baby move more. You should do a fetal movement count:  When your baby is normally most active.  At the same time each day. A good time to count movements is while you are resting, after having something to eat and drink. How do I count fetal movements? 1. Find a quiet, comfortable area. Sit, or lie down on your side. 2. Write down the date, the start time and stop time, and the number of movements that you felt between those two times. Take this information with you to your health care visits. 3. Write down your start time when you feel the first movement. 4. Count kicks, flutters, swishes, rolls, and jabs. You should feel at least 10 movements. 5. You may stop counting after you have felt 10 movements, or if you have been counting for 2 hours. Write down the stop  time. 6. If you do not feel 10 movements in 2 hours, contact your health care provider for further instructions. Your health care provider may want to do additional tests to assess your baby's well-being. Contact a health care provider if:  You feel fewer than 10 movements in 2 hours.  Your baby is not moving like he or she usually does. Date: ____________ Start time: ____________ Stop time: ____________ Movements: ____________ Date: ____________ Start time: ____________ Stop time: ____________ Movements: ____________ Date: ____________ Start time: ____________ Stop time: ____________ Movements: ____________ Date: ____________ Start time: ____________ Stop time: ____________ Movements: ____________ Date: ____________ Start time: ____________ Stop time: ____________ Movements: ____________ Date: ____________ Start time: ____________ Stop time: ____________ Movements: ____________ Date: ____________ Start time: ____________ Stop time: ____________ Movements: ____________ Date: ____________ Start time: ____________ Stop time: ____________ Movements: ____________ Date: ____________ Start time: ____________ Stop time: ____________ Movements: ____________ This information is not intended to replace advice given to you by your health care provider. Make sure you discuss any questions you have with your health care provider. Document Revised: 02/15/2019 Document Reviewed: 02/15/2019 Elsevier Patient Education  2020 [employer].

## 2020-05-22 NOTE — Progress Notes (Signed)
ROB-Doing well. NST today reactive, see chart. 36 week cultures collected today, see orders. Discussed recommendations for antenatal testing and IOL due to [facility], patient verbalized understanding. IOL scheduled 06/09/2020 at 0500. Reviewed red flag symptoms and when to call. RTC x 1 week for growth ultrasound/AFI and ROB or sooner if needed.

## 2020-05-24 LAB — STREP GP B NAA: Strep Gp B NAA: NEGATIVE

## 2020-05-25 LAB — GC/CHLAMYDIA PROBE AMP
Chlamydia trachomatis, NAA: NEGATIVE
Neisseria Gonorrhoeae by PCR: NEGATIVE

## 2020-05-29 ENCOUNTER — Encounter: Payer: Self-pay | Admitting: Certified Nurse Midwife

## 2020-05-29 ENCOUNTER — Ambulatory Visit (INDEPENDENT_AMBULATORY_CARE_PROVIDER_SITE_OTHER): Payer: Managed Care, Other (non HMO) | Admitting: Certified Nurse Midwife

## 2020-05-29 ENCOUNTER — Ambulatory Visit (INDEPENDENT_AMBULATORY_CARE_PROVIDER_SITE_OTHER): Payer: Managed Care, Other (non HMO)

## 2020-05-29 ENCOUNTER — Other Ambulatory Visit: Payer: Self-pay

## 2020-05-29 VITALS — BP 126/88 | Wt 176.7 lb

## 2020-05-29 DIAGNOSIS — O10919 Unspecified pre-existing hypertension complicating pregnancy, unspecified trimester: Secondary | ICD-10-CM

## 2020-05-29 DIAGNOSIS — Z3403 Encounter for supervision of normal first pregnancy, third trimester: Secondary | ICD-10-CM | POA: Diagnosis not present

## 2020-05-29 DIAGNOSIS — Z3483 Encounter for supervision of other normal pregnancy, third trimester: Secondary | ICD-10-CM | POA: Diagnosis not present

## 2020-05-29 DIAGNOSIS — Z3A37 37 weeks gestation of pregnancy: Secondary | ICD-10-CM

## 2020-05-29 DIAGNOSIS — Z3A36 36 weeks gestation of pregnancy: Secondary | ICD-10-CM

## 2020-05-29 LAB — POCT URINALYSIS DIPSTICK OB
Bilirubin, UA: NEGATIVE
Blood, UA: NEGATIVE
Glucose, UA: NEGATIVE
Ketones, UA: NEGATIVE
Leukocytes, UA: NEGATIVE
Nitrite, UA: NEGATIVE
POC,PROTEIN,UA: NEGATIVE
Spec Grav, UA: 1.015 (ref 1.010–1.025)
Urobilinogen, UA: 0.2 E.U./dL
pH, UA: 6 (ref 5.0–8.0)

## 2020-05-29 NOTE — Patient Instructions (Addendum)
Fetal Movement Counts Patient Name: ________________________________________________ Patient Due Date: ____________________ What is a fetal movement count?  A fetal movement count is the number of times that you feel your baby move during a certain amount of time. This may also be called a fetal kick count. A fetal movement count is recommended for every pregnant woman. You may be asked to start counting fetal movements as early as week 28 of your pregnancy. Pay attention to when your baby is most active. You may notice your baby's sleep and wake cycles. You may also notice things that make your baby move more. You should do a fetal movement count:  When your baby is normally most active.  At the same time each day. A good time to count movements is while you are resting, after having something to eat and drink. How do I count fetal movements? 1. Find a quiet, comfortable area. Sit, or lie down on your side. 2. Write down the date, the start time and stop time, and the number of movements that you felt between those two times. Take this information with you to your health care visits. 3. Write down your start time when you feel the first movement. 4. Count kicks, flutters, swishes, rolls, and jabs. You should feel at least 10 movements. 5. You may stop counting after you have felt 10 movements, or if you have been counting for 2 hours. Write down the stop time. 6. If you do not feel 10 movements in 2 hours, contact your health care provider for further instructions. Your health care provider may want to do additional tests to assess your baby's well-being. Contact a health care provider if:  You feel fewer than 10 movements in 2 hours.  Your baby is not moving like he or she usually does. Date: ____________ Start time: ____________ Stop time: ____________ Movements: ____________ Date: ____________ Start time: ____________ Stop time: ____________ Movements: ____________ Date: ____________  Start time: ____________ Stop time: ____________ Movements: ____________ Date: ____________ Start time: ____________ Stop time: ____________ Movements: ____________ Date: ____________ Start time: ____________ Stop time: ____________ Movements: ____________ Date: ____________ Start time: ____________ Stop time: ____________ Movements: ____________ Date: ____________ Start time: ____________ Stop time: ____________ Movements: ____________ Date: ____________ Start time: ____________ Stop time: ____________ Movements: ____________ Date: ____________ Start time: ____________ Stop time: ____________ Movements: ____________ This information is not intended to replace advice given to you by your health care provider. Make sure you discuss any questions you have with your health care provider. Document Revised: 02/15/2019 Document Reviewed: 02/15/2019 Elsevier Patient Education  2020 [employer]  Nonstress Test A nonstress test is a procedure that is done during pregnancy in order to check the baby's heartbeat. The procedure can help show if the baby (fetus) is healthy. It is commonly done when:  The baby is past his or her due date.  The pregnancy is high risk.  The baby is moving less than normal.  The mother has lost a pregnancy in the past.  The health care provider suspects a problem with the baby's growth.  There is too much or too little amniotic fluid. The procedure is often done in the third trimester of pregnancy to find out if an early delivery is needed and whether such a delivery is safe. During a nonstress test, the baby's heartbeat is monitored when the baby is resting and when the baby is moving. If the baby is healthy, the heart rate will increase when he or she moves or kicks and   will return to normal when he or she rests. Tell a health care provider about:  Any allergies you have.  Any medical conditions you have.  All medicines you are taking, including vitamins, herbs,  eye drops, creams, and over-the-counter medicines. What are the risks? There are no risks to you or your baby from a nonstress test. This procedure should not be painful or uncomfortable. What happens before the procedure?  Eat a meal right before the test or as directed by your health care provider. Food may help encourage the baby to move.  Use the restroom right before the test. What happens during the procedure?  Two monitors will be placed on your abdomen. One will record the baby's heart rate and the other will record the contractions of your uterus.  You may be asked to lie down on your side or to sit upright.  You may be given a button to press when you feel your baby move.  Your health care provider will listen to your baby's heartbeat and recorded it. He or she may also watch your baby's heartbeat on a screen.  If the baby seems to be sleeping, you may be asked to drink some juice or soda, eat a snack, or change positions. The procedure may vary among health care providers and hospitals. What happens after the procedure?  Your health care provider will discuss the test results with you and make recommendations for the future. Depending on the results, your health care provider may order additional tests or another course of action.  If your health care provider gave you any diet or activity instructions, make sure to follow them.  Keep all follow-up visits as told by your health care provider. This is important. Summary  A nonstress test is a procedure that is done during pregnancy in order to check the baby's heartbeat. The procedure can help show if the baby is healthy.  The procedure is often done in the third trimester of pregnancy to find out if an early delivery is needed and whether such a delivery is safe.  During a nonstress test, the baby's heartbeat is monitored when the baby is resting and when the baby is moving. If the baby is healthy, the heart rate will  increase when he or she moves or kicks and will return to normal when he or she rests.  Your health care provider will discuss the test results with you and make recommendations for the future. This information is not intended to replace advice given to you by your health care provider. Make sure you discuss any questions you have with your health care provider. Document Revised: 10/07/2016 Document Reviewed: 10/07/2016 Elsevier Patient Education  2020 [employer]  

## 2020-05-29 NOTE — Progress Notes (Signed)
ROB-Doing well, no questions or concerns. Growth [place] today wnl, see findings below. NST reactive, see chart. Anticipatory guidance regarding course of prenatal care. Reviewed red flag symptoms and when to call. RTC x 1 week for NST and ROB or sooner if needed.   ULTRASOUND REPORT  Location: [facility] Date of Service: 05/29/2020   Indications:growth/afi Findings:  T[place]ara [place] intrauterine pregnancy is visualized with FHR at 121 BPM. Fetal presentation is Cephalic.  Placenta: posterior. Grade: 1 AFI: 13.0 cm  Growth percentile is 75. EFW: 3472g ( 7 lbs 9 oz)   Impression: 1. [redacted]w[redacted]d Viable Singleton Intrauterine pregnancy previously established criteria. 2. Growth is 75 %ile.  AFI is 13.0 cm.   Recommendations: 1.Clinical correlation with the patient's History and Physical Exam.

## 2020-05-30 ENCOUNTER — Encounter: Payer: Self-pay | Admitting: Family Medicine

## 2020-06-02 ENCOUNTER — Other Ambulatory Visit: Payer: Self-pay

## 2020-06-02 ENCOUNTER — Ambulatory Visit (INDEPENDENT_AMBULATORY_CARE_PROVIDER_SITE_OTHER): Payer: Managed Care, Other (non HMO) | Admitting: Certified Nurse Midwife

## 2020-06-02 ENCOUNTER — Other Ambulatory Visit: Payer: Managed Care, Other (non HMO)

## 2020-06-02 VITALS — BP 139/87 | Wt 174.5 lb

## 2020-06-02 DIAGNOSIS — O10919 Unspecified pre-existing hypertension complicating pregnancy, unspecified trimester: Secondary | ICD-10-CM

## 2020-06-02 DIAGNOSIS — Z3A37 37 weeks gestation of pregnancy: Secondary | ICD-10-CM

## 2020-06-02 DIAGNOSIS — Z3403 Encounter for supervision of normal first pregnancy, third trimester: Secondary | ICD-10-CM

## 2020-06-02 LAB — POCT URINALYSIS DIPSTICK OB
Bilirubin, UA: NEGATIVE
Blood, UA: NEGATIVE
Glucose, UA: NEGATIVE
Ketones, UA: NEGATIVE
Leukocytes, UA: NEGATIVE
Nitrite, UA: NEGATIVE
POC,PROTEIN,UA: NEGATIVE
Spec Grav, UA: 1.025 (ref 1.010–1.025)
Urobilinogen, UA: 0.2 E.U./dL
pH, UA: 5 (ref 5.0–8.0)

## 2020-06-02 NOTE — Patient Instructions (Signed)
Labor Induction ° °Labor induction is when steps are taken to cause a pregnant woman to begin the labor process. Most women go into labor on their own between 37 weeks and 42 weeks of pregnancy. When this does not happen or when there is a medical need for labor to begin, steps may be taken to induce labor. Labor induction causes a pregnant woman's uterus to contract. It also causes the cervix to soften (ripen), open (dilate), and thin out (efface). Usually, labor is not induced before 39 weeks of pregnancy unless there is a medical reason to do so. Your health care provider will determine if labor induction is needed. °Before inducing labor, your health care provider will consider a number of factors, including: °· Your medical condition and your baby's. °· How many weeks along you are in your pregnancy. °· How mature your baby's lungs are. °· The condition of your cervix. °· The position of your baby. °· The size of your birth canal. °What are some reasons for labor induction? °Labor may be induced if: °· Your health or your baby's health is at risk. °· Your pregnancy is overdue by 1 week or more. °· Your water breaks but labor does not start on its own. °· There is a low amount of amniotic fluid around your baby. °You may also choose (elect) to have labor induced at a certain time. Generally, elective labor induction is done no earlier than 39 weeks of pregnancy. °What methods are used for labor induction? °Methods used for labor induction include: °· Prostaglandin medicine. This medicine starts contractions and causes the cervix to dilate and ripen. It can be taken by mouth (orally) or by being inserted into the vagina (suppository). °· Inserting a small, thin tube (catheter) with a balloon into the vagina and then expanding the balloon with water to dilate the cervix. °· Stripping the membranes. In this method, your health care provider gently separates amniotic sac tissue from the cervix. This causes the  cervix to stretch, which in turn causes the release of a hormone called progesterone. The hormone causes the uterus to contract. This procedure is often done during an office visit, after which you will be sent home to wait for contractions to begin. °· Breaking the water. In this method, your health care provider uses a small instrument to make a small hole in the amniotic sac. This eventually causes the amniotic sac to break. Contractions should begin after a few hours. °· Medicine to trigger or strengthen contractions. This medicine is given through an IV that is inserted into a vein in your arm. °Except for membrane stripping, which can be done in a clinic, labor induction is done in the hospital so that you and your baby can be carefully monitored. °How long does it take for labor to be induced? °The length of time it takes to induce labor depends on how ready your body is for labor. Some inductions can take up to 2-3 days, while others may take less than a day. Induction may take longer if: °· You are induced early in your pregnancy. °· It is your first pregnancy. °· Your cervix is not ready. °What are some risks associated with labor induction? °Some risks associated with labor induction include: °· Changes in fetal heart rate, such as being too high, too low, or irregular (erratic). °· Failed induction. °· Infection in the mother or the baby. °· Increased risk of having a cesarean delivery. °· Fetal death. °· Breaking off (abruption)   of the placenta from the uterus (rare). °· Rupture of the uterus (very rare). °When induction is needed for medical reasons, the benefits of induction generally outweigh the risks. °What are some reasons for not inducing labor? °Labor induction should not be done if: °· Your baby does not tolerate contractions. °· You have had previous surgeries on your uterus, such as a myomectomy, removal of fibroids, or a vertical scar from a previous cesarean delivery. °· Your placenta lies  very low in your uterus and blocks the opening of the cervix (placenta previa). °· Your baby is not in a head-down position. °· The umbilical cord drops down into the birth canal in front of the baby. °· There are unusual circumstances, such as the baby being very early (premature). °· You have had more than 2 previous cesarean deliveries. °Summary °· Labor induction is when steps are taken to cause a pregnant woman to begin the labor process. °· Labor induction causes a pregnant woman's uterus to contract. It also causes the cervix to ripen, dilate, and efface. °· Labor is not induced before 39 weeks of pregnancy unless there is a medical reason to do so. °· When induction is needed for medical reasons, the benefits of induction generally outweigh the risks. °This information is not intended to replace advice given to you by your health care provider. Make sure you discuss any questions you have with your health care provider. °Document Revised: 07/01/2017 Document Reviewed: 08/11/2016 °Elsevier Patient Education © 2020 [employer] ° ° °Fetal Movement Counts °Patient Name: ________________________________________________ Patient Due Date: ____________________ °What is a fetal movement count? ° °A fetal movement count is the number of times that you feel your baby move during a certain amount of time. This may also be called a fetal kick count. A fetal movement count is recommended for every pregnant woman. You may be asked to start counting fetal movements as early as week 28 of your pregnancy. °Pay attention to when your baby is most active. You may notice your baby's sleep and wake cycles. You may also notice things that make your baby move more. You should do a fetal movement count: °· When your baby is normally most active. °· At the same time each day. °A good time to count movements is while you are resting, after having something to eat and drink. °How do I count fetal movements? °1. Find a quiet,  comfortable area. Sit, or lie down on your side. °2. Write down the date, the start time and stop time, and the number of movements that you felt between those two times. Take this information with you to your health care visits. °3. Write down your start time when you feel the first movement. °4. Count kicks, flutters, swishes, rolls, and jabs. You should feel at least 10 movements. °5. You may stop counting after you have felt 10 movements, or if you have been counting for 2 hours. Write down the stop time. °6. If you do not feel 10 movements in 2 hours, contact your health care provider for further instructions. Your health care provider may want to do additional tests to assess your baby's well-being. °Contact a health care provider if: °· You feel fewer than 10 movements in 2 hours. °· Your baby is not moving like he or she usually does. °Date: ____________ Start time: ____________ Stop time: ____________ Movements: ____________ °Date: ____________ Start time: ____________ Stop time: ____________ Movements: ____________ °Date: ____________ Start time: ____________ Stop time: ____________ Movements: ____________ °  Date: ____________ Start time: ____________ Stop time: ____________ Movements: ____________ °Date: ____________ Start time: ____________ Stop time: ____________ Movements: ____________ °Date: ____________ Start time: ____________ Stop time: ____________ Movements: ____________ °Date: ____________ Start time: ____________ Stop time: ____________ Movements: ____________ °Date: ____________ Start time: ____________ Stop time: ____________ Movements: ____________ °Date: ____________ Start time: ____________ Stop time: ____________ Movements: ____________ °This information is not intended to replace advice given to you by your health care provider. Make sure you discuss any questions you have with your health care provider. °Document Revised: 02/15/2019 Document Reviewed: 02/15/2019 °Elsevier Patient  Education © 2020 [employer] ° °

## 2020-06-02 NOTE — Progress Notes (Signed)
ROB-Doing well, no questions or concerns. Scheduled for IOL 06/09/2020 at 0500. Discussed pre-admit COVID testing. NST today reactive, see chart. Continue daily kick counts. Reviewed red flag symptoms and when to call. RTC x  7 weeks for PPV or sooner if needed.

## 2020-06-06 ENCOUNTER — Other Ambulatory Visit
Admission: RE | Admit: 2020-06-06 | Discharge: 2020-06-06 | Disposition: A | Discharge status: Home / Self Care | Payer: Managed Care, Other (non HMO) | Source: Ambulatory Visit | Attending: Certified Nurse Midwife | Admitting: Certified Nurse Midwife

## 2020-06-06 ENCOUNTER — Other Ambulatory Visit: Payer: Self-pay

## 2020-06-06 DIAGNOSIS — Z20822 Contact with and (suspected) exposure to covid-19: Secondary | ICD-10-CM | POA: Insufficient documentation

## 2020-06-06 DIAGNOSIS — Z01812 Encounter for preprocedural laboratory examination: Secondary | ICD-10-CM | POA: Insufficient documentation

## 2020-06-06 LAB — SARS CORONAVIRUS 2 (TAT 6-24 HRS): SARS Coronavirus 2: NEGATIVE

## 2020-06-09 ENCOUNTER — Inpatient Hospital Stay: Payer: Managed Care, Other (non HMO) | Admitting: Anesthesiology

## 2020-06-09 ENCOUNTER — Encounter: Payer: Self-pay | Admitting: Certified Nurse Midwife

## 2020-06-09 ENCOUNTER — Other Ambulatory Visit: Payer: Self-pay

## 2020-06-09 ENCOUNTER — Inpatient Hospital Stay
Admission: EM | Admit: 2020-06-09 | Discharge: 2020-06-10 | DRG: 807 | Disposition: A | Discharge status: Home / Self Care | Payer: Managed Care, Other (non HMO) | Attending: Certified Nurse Midwife | Admitting: Certified Nurse Midwife

## 2020-06-09 DIAGNOSIS — O10919 Unspecified pre-existing hypertension complicating pregnancy, unspecified trimester: Secondary | ICD-10-CM | POA: Diagnosis not present

## 2020-06-09 DIAGNOSIS — Z3A38 38 weeks gestation of pregnancy: Secondary | ICD-10-CM | POA: Diagnosis not present

## 2020-06-09 DIAGNOSIS — Z3490 Encounter for supervision of normal pregnancy, unspecified, unspecified trimester: Secondary | ICD-10-CM | POA: Diagnosis present

## 2020-06-09 DIAGNOSIS — O1002 Pre-existing essential hypertension complicating childbirth: Secondary | ICD-10-CM | POA: Diagnosis present

## 2020-06-09 DIAGNOSIS — Z20822 Contact with and (suspected) exposure to covid-19: Secondary | ICD-10-CM | POA: Diagnosis present

## 2020-06-09 LAB — COMPREHENSIVE METABOLIC PANEL
ALT: 10 U/L (ref 0–44)
AST: 23 U/L (ref 15–41)
Albumin: 3.2 g/dL — ABNORMAL LOW (ref 3.5–5.0)
Alkaline Phosphatase: 162 U/L — ABNORMAL HIGH (ref 38–126)
Anion gap: 10 (ref 5–15)
BUN: 15 mg/dL (ref 6–20)
CO2: 17 mmol/L — ABNORMAL LOW (ref 22–32)
Calcium: 9.4 mg/dL (ref 8.9–10.3)
Chloride: 108 mmol/L (ref 98–111)
Creatinine, Ser: 0.71 mg/dL (ref 0.44–1.00)
GFR, Estimated: >60 mL/min (ref >60)
Glucose, Bld: 112 mg/dL — ABNORMAL HIGH (ref 70–99)
Potassium: 4 mmol/L (ref 3.5–5.1)
Sodium: 135 mmol/L (ref 135–145)
Total Bilirubin: 0.8 mg/dL (ref 0.3–1.2)
Total Protein: 6 g/dL — ABNORMAL LOW (ref 6.5–8.1)

## 2020-06-09 LAB — CBC
HCT: 40.9 % (ref 36.0–46.0)
Hemoglobin: 14 g/dL (ref 12.0–15.0)
MCH: 30.2 pg (ref 26.0–34.0)
MCHC: 34.2 g/dL (ref 30.0–36.0)
MCV: 88.1 fL (ref 80.0–100.0)
Platelets: 149 10*3/uL — ABNORMAL LOW (ref 150–400)
RBC: 4.64 MIL/uL (ref 3.87–5.11)
RDW: 13.4 % (ref 11.5–15.5)
WBC: 8.6 10*3/uL (ref 4.0–10.5)
nRBC: 0 % (ref 0.0–0.2)

## 2020-06-09 LAB — RPR: RPR Ser Ql: NONREACTIVE

## 2020-06-09 LAB — TYPE AND SCREEN
ABO/RH(D): O POS
Antibody Screen: NEGATIVE

## 2020-06-09 LAB — ABO/RH: ABO/RH(D): O POS

## 2020-06-09 LAB — PROTEIN / CREATININE RATIO, URINE
Creatinine, Urine: 103 mg/dL
Protein Creatinine Ratio: 0.11 mg/mg{Cre} (ref 0.00–0.15)
Total Protein, Urine: 11 mg/dL

## 2020-06-09 MED ORDER — ACETAMINOPHEN 325 MG PO TABS: 650.0000 mg | ORAL_TABLET | ORAL | Status: DC | PRN

## 2020-06-09 MED ORDER — LIDOCAINE HCL (PF) 1 % IJ SOLN
INTRAMUSCULAR | Status: AC
  Filled 2020-06-09: qty 30

## 2020-06-09 MED ORDER — SODIUM CHLORIDE 0.9 % IV SOLN: 250.0000 mL | INTRAVENOUS | Status: DC | PRN

## 2020-06-09 MED ORDER — OXYCODONE-ACETAMINOPHEN 5-325 MG PO TABS: 2.0000 | ORAL_TABLET | ORAL | Status: DC | PRN

## 2020-06-09 MED ORDER — EPHEDRINE 5 MG/ML INJ: 10.0000 mg | INTRAVENOUS | Status: DC | PRN

## 2020-06-09 MED ORDER — FENTANYL 2.5 MCG/ML W/ROPIVACAINE 0.15% IN NS 100 ML EPIDURAL (ARMC)
12.0000 mL/h | EPIDURAL | Status: DC
  Administered 2020-06-09: 12 mL/h via EPIDURAL

## 2020-06-09 MED ORDER — MISOPROSTOL 50MCG HALF TABLET
50.0000 ug | ORAL_TABLET | Freq: Once | ORAL | Status: AC
  Administered 2020-06-09: 50 ug via ORAL

## 2020-06-09 MED ORDER — OXYTOCIN-SODIUM CHLORIDE 30-0.9 UT/500ML-% IV SOLN: 2.5000 [IU]/h | INTRAVENOUS | Status: DC

## 2020-06-09 MED ORDER — DIPHENHYDRAMINE HCL 25 MG PO CAPS: 25.0000 mg | ORAL_CAPSULE | Freq: Four times a day (QID) | ORAL | Status: DC | PRN

## 2020-06-09 MED ORDER — BENZOCAINE-MENTHOL 20-0.5 % EX AERO: 1.0000 "application " | INHALATION_SPRAY | CUTANEOUS | Status: DC | PRN

## 2020-06-09 MED ORDER — SOD CITRATE-CITRIC ACID 500-334 MG/5ML PO SOLN: 30.0000 mL | ORAL | Status: DC | PRN

## 2020-06-09 MED ORDER — DIPHENHYDRAMINE HCL 50 MG/ML IJ SOLN: 12.5000 mg | INTRAMUSCULAR | Status: DC | PRN

## 2020-06-09 MED ORDER — OXYTOCIN BOLUS FROM INFUSION
333.0000 mL | Freq: Once | INTRAVENOUS | Status: DC
  Administered 2020-06-09: 333 mL via INTRAVENOUS

## 2020-06-09 MED ORDER — MISOPROSTOL 25 MCG QUARTER TABLET
25.0000 ug | ORAL_TABLET | Freq: Once | ORAL | Status: AC
  Filled 2020-06-09: qty 1

## 2020-06-09 MED ORDER — FENTANYL 2.5 MCG/ML W/ROPIVACAINE 0.15% IN NS 100 ML EPIDURAL (ARMC)
EPIDURAL | Status: AC
  Filled 2020-06-09: qty 100

## 2020-06-09 MED ORDER — COCONUT OIL OIL
1.0000 "application " | TOPICAL_OIL | Status: DC | PRN
  Filled 2020-06-09: qty 120

## 2020-06-09 MED ORDER — TERBUTALINE SULFATE 1 MG/ML IJ SOLN: 0.2500 mg | Freq: Once | INTRAMUSCULAR | Status: DC | PRN

## 2020-06-09 MED ORDER — MISOPROSTOL 200 MCG PO TABS
ORAL_TABLET | ORAL | Status: AC
  Administered 2020-06-09: 25 ug via ORAL
  Filled 2020-06-09: qty 4

## 2020-06-09 MED ORDER — OXYCODONE-ACETAMINOPHEN 5-325 MG PO TABS: 1.0000 | ORAL_TABLET | ORAL | Status: DC | PRN

## 2020-06-09 MED ORDER — SODIUM CHLORIDE 0.9% FLUSH: 3.0000 mL | Freq: Two times a day (BID) | INTRAVENOUS | Status: DC

## 2020-06-09 MED ORDER — LACTATED RINGERS IV SOLN: 500.0000 mL | Freq: Once | INTRAVENOUS | Status: DC

## 2020-06-09 MED ORDER — LACTATED RINGERS IV SOLN: INTRAVENOUS | Status: DC

## 2020-06-09 MED ORDER — SENNOSIDES-DOCUSATE SODIUM 8.6-50 MG PO TABS
2.0000 | ORAL_TABLET | ORAL | Status: DC
  Administered 2020-06-10: 2 via ORAL
  Filled 2020-06-09: qty 2

## 2020-06-09 MED ORDER — OXYTOCIN-SODIUM CHLORIDE 30-0.9 UT/500ML-% IV SOLN
1.0000 m[IU]/min | INTRAVENOUS | Status: DC
  Filled 2020-06-09: qty 1000

## 2020-06-09 MED ORDER — ZOLPIDEM TARTRATE 5 MG PO TABS: 5.0000 mg | ORAL_TABLET | Freq: Every evening | ORAL | Status: DC | PRN

## 2020-06-09 MED ORDER — ONDANSETRON HCL 4 MG/2ML IJ SOLN: 4.0000 mg | INTRAMUSCULAR | Status: DC | PRN

## 2020-06-09 MED ORDER — PHENYLEPHRINE 40 MCG/ML (10ML) SYRINGE FOR IV PUSH (FOR BLOOD PRESSURE SUPPORT): 80.0000 ug | PREFILLED_SYRINGE | INTRAVENOUS | Status: DC | PRN

## 2020-06-09 MED ORDER — ONDANSETRON HCL 4 MG/2ML IJ SOLN: 4.0000 mg | Freq: Four times a day (QID) | INTRAMUSCULAR | Status: DC | PRN

## 2020-06-09 MED ORDER — PRENATAL MULTIVITAMIN CH
1.0000 | ORAL_TABLET | Freq: Every day | ORAL | Status: DC
  Administered 2020-06-10: 1 via ORAL
  Filled 2020-06-09: qty 1

## 2020-06-09 MED ORDER — MISOPROSTOL 50MCG HALF TABLET: 50.0000 ug | ORAL_TABLET | Freq: Once | ORAL | Status: DC

## 2020-06-09 MED ORDER — OXYCODONE HCL 5 MG PO TABS: 10.0000 mg | ORAL_TABLET | ORAL | Status: DC | PRN

## 2020-06-09 MED ORDER — SIMETHICONE 80 MG PO CHEW: 80.0000 mg | CHEWABLE_TABLET | ORAL | Status: DC | PRN

## 2020-06-09 MED ORDER — OXYCODONE HCL 5 MG PO TABS: 5.0000 mg | ORAL_TABLET | ORAL | Status: DC | PRN

## 2020-06-09 MED ORDER — LIDOCAINE HCL (PF) 1 % IJ SOLN
INTRAMUSCULAR | Status: DC | PRN
  Administered 2020-06-09: 3 mL via INTRADERMAL

## 2020-06-09 MED ORDER — IBUPROFEN 600 MG PO TABS
600.0000 mg | ORAL_TABLET | Freq: Four times a day (QID) | ORAL | Status: DC
  Administered 2020-06-10: 600 mg via ORAL
  Filled 2020-06-09: qty 1

## 2020-06-09 MED ORDER — OXYTOCIN 10 UNIT/ML IJ SOLN
INTRAMUSCULAR | Status: AC
  Filled 2020-06-09: qty 2

## 2020-06-09 MED ORDER — LIDOCAINE-EPINEPHRINE (PF) 1.5 %-1:200000 IJ SOLN
INTRAMUSCULAR | Status: DC | PRN
  Administered 2020-06-09: 4 mL via EPIDURAL

## 2020-06-09 MED ORDER — IBUPROFEN 600 MG PO TABS
ORAL_TABLET | ORAL | Status: AC
  Administered 2020-06-09: 600 mg via ORAL
  Filled 2020-06-09: qty 1

## 2020-06-09 MED ORDER — ONDANSETRON HCL 4 MG PO TABS: 4.0000 mg | ORAL_TABLET | ORAL | Status: DC | PRN

## 2020-06-09 MED ORDER — BUTORPHANOL TARTRATE 1 MG/ML IJ SOLN: 1.0000 mg | INTRAMUSCULAR | Status: DC | PRN

## 2020-06-09 MED ORDER — DIBUCAINE (PERIANAL) 1 % EX OINT: 1.0000 "application " | TOPICAL_OINTMENT | CUTANEOUS | Status: DC | PRN

## 2020-06-09 MED ORDER — LACTATED RINGERS IV SOLN: 500.0000 mL | INTRAVENOUS | Status: DC | PRN

## 2020-06-09 MED ORDER — MISOPROSTOL 50MCG HALF TABLET
ORAL_TABLET | ORAL | Status: AC
  Administered 2020-06-09: 50 ug via ORAL
  Filled 2020-06-09: qty 1

## 2020-06-09 MED ORDER — SODIUM CHLORIDE 0.9% FLUSH: 3.0000 mL | INTRAVENOUS | Status: DC | PRN

## 2020-06-09 MED ORDER — WITCH HAZEL-GLYCERIN EX PADS: 1.0000 "application " | MEDICATED_PAD | CUTANEOUS | Status: DC | PRN

## 2020-06-09 MED ORDER — BUPIVACAINE HCL (PF) 0.25 % IJ SOLN
INTRAMUSCULAR | Status: DC | PRN
  Administered 2020-06-09: 3 mL via EPIDURAL
  Administered 2020-06-09: 5 mL via EPIDURAL

## 2020-06-09 MED ORDER — MISOPROSTOL 50MCG HALF TABLET
50.0000 ug | ORAL_TABLET | ORAL | Status: DC
  Filled 2020-06-09: qty 1

## 2020-06-09 MED ORDER — AMMONIA AROMATIC IN INHA
RESPIRATORY_TRACT | Status: AC
  Filled 2020-06-09: qty 10

## 2020-06-09 MED ORDER — MISOPROSTOL 50MCG HALF TABLET: 50.0000 ug | ORAL_TABLET | Freq: Once | ORAL | Status: AC

## 2020-06-09 MED ORDER — LIDOCAINE HCL (PF) 1 % IJ SOLN: 30.0000 mL | INTRAMUSCULAR | Status: DC | PRN

## 2020-06-09 NOTE — Anesthesia Preprocedure Evaluation (Signed)
Anesthesia Evaluation  Patient identified by MRN, date of birth, ID band Patient awake    Reviewed: Allergy & Precautions, H&P , NPO status , Patient's Chart, lab work & pertinent test results, reviewed documented beta blocker date and time   Airway Mallampati: II  TM Distance: >3 FB Neck ROM: full    Dental no notable dental hx. (+) Teeth Intact   Pulmonary neg pulmonary ROS, Current Smoker,    Pulmonary exam normal breath sounds clear to auscultation       Cardiovascular Exercise Tolerance: Good hypertension, On Medications negative cardio ROS   Rhythm:regular Rate:Normal     Neuro/Psych PSYCHIATRIC DISORDERS Anxiety negative neurological ROS     GI/Hepatic negative GI ROS, Neg liver ROS,   Endo/Other  negative endocrine ROSdiabetes, Gestational  Renal/GU      Musculoskeletal   Abdominal   Peds  Hematology negative hematology ROS (+)   Anesthesia Other Findings   Reproductive/Obstetrics (+) Pregnancy                             Anesthesia Physical Anesthesia Plan  ASA: II  Anesthesia Plan: Epidural   Post-op Pain Management:    Induction:   PONV Risk Score and Plan:   Airway Management Planned:   Additional Equipment:   Intra-op Plan:   Post-operative Plan:   Informed Consent: I have reviewed the patients History and Physical, chart, labs and discussed the procedure including the risks, benefits and alternatives for the proposed anesthesia with the patient or authorized representative who has indicated his/her understanding and acceptance.       Plan Discussed with:   Anesthesia Plan Comments:         Anesthesia Quick Evaluation

## 2020-06-09 NOTE — H&P (Addendum)
Obstetric History and Physical  Tamara Mills is a 29 y.o. G2P1001 with IUP at [redacted]w[redacted]d presenting for induction of labor due to chronic hypertension not requiring medication.   Patient states she has been having irregular contractions, none vaginal bleeding, intact membranes, with active fetal movement.    Denies difficulty breathing or respiratory distress, chest pain, abdominal pain, dysuria, and leg pain or swelling.   Prenatal Course  Source of Care: [facility]-initial visit at 11 weeks, total visits 9   Pregnancy complications or risks: Chronic hypertension-no medications, Rh positive, GBS negative, History of anxiety  Prenatal labs and studies:  ABO, Rh: --/--/O POS Performed at [facility], [address] [place], [place] [address]  [phone]/29 0800)  Antibody: NEG (11/29 0506)  Rubella: 2.94 (04/30 0086)  Varicella: 1,720 (04/30 0927)  RPR: Non Reactive (09/30 1114)   HBsAg: Negative (04/30 0927)   HIV: Non Reactive (04/30 0927)   PYP:PJKDTOIZ/-- (11/11 1105)  1 hr Glucola: 84 (09/30 1114)  Genetic screening: Low risk female (07/29 1556)  Anatomy US: Complete, normal except for unresolved echogenic intracardiac foci (07/22 1245)  Past Medical History:  Diagnosis Date  . Acne   . Amenorrhea    Hx of  . Anxiety   . COVID-19   . Eating disorder    ? eating disorder in past ( pt states just running too much)  . Hand eczema   . Hypertension   . Seasonal allergies   . Underweight    Hx of    Past Surgical History:  Procedure Laterality Date  . WISDOM TOOTH EXTRACTION      OB History  Gravida Para Term Preterm AB Living  2 1 1     1   SAB TAB Ectopic Multiple Live Births        0 1    # Outcome Date GA Lbr Len/2nd Weight Sex Delivery Anes PTL Lv  2 Current           1 Term 09/10/17 [redacted]w[redacted]d / 04:02 3600 g F Vag-Spont EPI  LIV    Social History   Socioeconomic History  . Marital status: Married    Spouse name: Not on file  . Number of children: Not  on file  . Years of education: Not on file  . Highest education level: Not on file  Occupational History  . Occupation: Pharmacist  Tobacco Use  . Smoking status: Never Smoker  . Smokeless tobacco: Never Used  Vaping Use  . Vaping Use: Never used  Substance and Sexual Activity  . Alcohol use: Not Currently    Comment: occasional  . Drug use: No  . Sexual activity: Yes    Partners: Male    Birth control/protection: Pill  Other Topics Concern  . Not on file  Social History Narrative  . Not on file   Social Determinants of Health   Financial Resource Strain:   . Difficulty of Paying Living Expenses: Not on file  Food Insecurity:   . Worried About [redacted]w[redacted]d in the Last Year: Not on file  . Ran Out of Food in the Last Year: Not on file  Transportation Needs:   . Lack of Transportation (Medical): Not on file  . Lack of Transportation (Non-Medical): Not on file  Physical Activity:   . Days of Exercise per Week: Not on file  . Minutes of Exercise per Session: Not on file  Stress:   . Feeling of Stress : Not on file  Social Connections:   . Frequency of Communication with Friends and Family: Not on file  . Frequency of Social Gatherings with Friends and Family: Not on file  . Attends Religious Services: Not on file  . Active Member of Clubs or Organizations: Not on file  . Attends Banker Meetings: Not on file  . Marital Status: Not on file    Family History  Problem Relation Age of Onset  . Crohn's disease Brother   . Ulcerative colitis Brother   . Sudden Cardiac Death Brother   . Hypertension Mother        had also preeclampsia  . Diabetes Maternal Grandmother        Type 1  . Cancer Paternal Grandmother 26       breast  . Breast cancer Paternal Grandmother   . Heart failure Paternal Grandfather        heart disease  . Cancer Paternal Aunt 40       breast  . Breast cancer Paternal Aunt   . Ovarian cancer Neg Hx   . Colon cancer Neg Hx    . Esophageal cancer Neg Hx   . Rectal cancer Neg Hx   . Stomach cancer Neg Hx     Medications Prior to Admission  Medication Sig Dispense Refill Last Dose  . aspirin EC 81 MG tablet Take 1 tablet (81 mg total) by mouth daily. Take after 12 weeks for prevention of preeclampssia later in pregnancy 300 tablet 2 06/08/2020 at Unknown time  . Prenatal Vit-Fe Fumarate-FA (PRENATAL MULTIVITAMIN) TABS tablet Take 1 tablet by mouth daily at 12 noon.   06/08/2020 at Unknown time    Allergies  Allergen Reactions  . Ortho Tri-Cyclen [Norgestimate-Eth Estradiol]     nausea  . Nickel Rash    Review of Systems: Negative except for what is mentioned in HPI.  Physical Exam:  Temp:  [97.6 F (36.4 C)-98.3 F (36.8 C)] 98.3 F (36.8 C) (11/29 0806) Pulse Rate:  [76-84] 76 (11/29 0806) Resp:  [16-18] 18 (11/29 0806) BP: (131-142)/(90-98) 131/90 (11/29 0806) Weight:  [79.4 kg] 79.4 kg (11/29 0517)  GENERAL: Well-developed, well-nourished female in no acute distress.   LUNGS: Clear to auscultation bilaterally.   HEART: Regular rate and rhythm.  ABDOMEN: Soft, nontender, nondistended, gravid.  EXTREMITIES: Nontender, no edema, 2+ distal pulses.  Cervical Exam: Dilation: 1 Effacement (%): 50 Cervical Position: Posterior Station: Ballotable Exam by:: B Darnell RN  FHR Category I  Contractions: Every two (2) to five (5) minutes; soft resting tone   Pertinent Labs/Studies:   Results for orders placed or performed during the hospital encounter of 06/09/20 (from the past 24 hour(s))  Type and screen     Status: None   Collection Time: 06/09/20  5:06 AM  Result Value Ref Range   ABO/RH(D) O POS    Antibody Screen NEG    Sample Expiration      06/12/2020,2359 Performed at [facility], [address] [place], [place] [address]   CBC     Status: Abnormal   Collection Time: 06/09/20  5:21 AM  Result Value Ref Range   WBC 8.6 4.0 - 10.5 K/uL   RBC 4.64 3.87 - 5.11 MIL/uL    Hemoglobin 14.0 12.0 - 15.0 g/dL   HCT 37.6 36 - 46 %   MCV 88.1 80.0 - 100.0 fL   MCH 30.2 26.0 - 34.0 pg   MCHC 34.2 30.0 - 36.0 g/dL   RDW 13.4  11.5 - 15.5 %   Platelets 149 (L) 150 - 400 K/uL   nRBC 0.0 0.0 - 0.2 %  Comprehensive metabolic panel     Status: Abnormal   Collection Time: 06/09/20  5:21 AM  Result Value Ref Range   Sodium 135 135 - 145 mmol/L   Potassium 4.0 3.5 - 5.1 mmol/L   Chloride 108 98 - 111 mmol/L   CO2 17 (L) 22 - 32 mmol/L   Glucose, Bld 112 (H) 70 - 99 mg/dL   BUN 15 6 - 20 mg/dL   Creatinine, Ser 2.83 0.44 - 1.00 mg/dL   Calcium 9.4 8.9 - 15.1 mg/dL   Total Protein 6.0 (L) 6.5 - 8.1 g/dL   Albumin 3.2 (L) 3.5 - 5.0 g/dL   AST 23 15 - 41 U/L   ALT 10 0 - 44 U/L   Alkaline Phosphatase 162 (H) 38 - 126 U/L   Total Bilirubin 0.8 0.3 - 1.2 mg/dL   GFR, Estimated >76 >16 mL/min   Anion gap 10 5 - 15  Protein / creatinine ratio, urine     Status: None   Collection Time: 06/09/20  5:21 AM  Result Value Ref Range   Creatinine, Urine 103 mg/dL   Total Protein, Urine 11 mg/dL   Protein Creatinine Ratio 0.11 0.00 - 0.15 mg/mg[Cre]  ABO/Rh     Status: None   Collection Time: 06/09/20  8:00 AM  Result Value Ref Range   ABO/RH(D)      O POS Performed at [facility], [address] [place], [place] [address]     Assessment :  Tamara Mills is a 29 y.o. G2P1001 at 106w4d being admitted for induction of labor due to Chronic hypertension, Rh positive, GBS negative  FHR Category I  Plan:  Admit to birthing suites, see orders.   Induction/Augmentation per protocol.  Delivery plan: Hopeful for vaginal birth.   Dr Logan Bores notified of admission and plan of care.    Gunnar Bulla, CNM [facility], [facility] 06/09/20 9:35 AM

## 2020-06-09 NOTE — Progress Notes (Signed)
Pt requests epidural,  1715 Dr Pernell Dupre notified 1725 MD at bedside 1738 Test dose given maternal hr58 Bolus 1 16ml .25% Marcaine given at 1742 Bolus 2 3 ml .25% Marcaine given at 1745 1746 Drip started

## 2020-06-09 NOTE — Progress Notes (Signed)
Patient ID: Tamara Mills, female   DOB: April 01, 1991, 29 y.o.   MRN: [id number]  Tamara Mills is a 29 y.o. G2P1001 at [redacted]w[redacted]d by LMP admitted for induction of labor due to chronic hypertension.  Subjective:  Patient sitting on birthing ball, reports irregular contractions. FOB at bedside for continuous labor support.   Denies difficulty breathing or respiratory distress, chest pain, dysuria, and leg pain or swelling.   Objective:  Temp:  [97.6 F (36.4 C)-98.7 F (37.1 C)] 98.7 F (37.1 C) (11/29 1127) Pulse Rate:  [68-157] 68 (11/29 1233) Resp:  [16-18] 18 (11/29 1127) BP: (129-151)/(17-99) 141/90 (11/29 1233) Weight:  [79.4 kg] 79.4 kg (11/29 0517)  Fetal Wellbeing:  Category I  UC:   irregular, every two (2) to four (4) minutes; soft resting tone  SVE:   Dilation: 3.5 Effacement (%): 70 Station: -2 Exam by:: Christean Leaf RN  Labs: Lab Results  Component Value Date   WBC 8.6 06/09/2020   HGB 14.0 06/09/2020   HCT 40.9 06/09/2020   MCV 88.1 06/09/2020   PLT 149 (L) 06/09/2020    Assessment:  Tamara Mills is a 29 y.o. G2P1001 at [redacted]w[redacted]d admitted for induction of labor due to chronic hypertension, Rh positive, GBS negative  FHR Category I  Plan:  Patient contacted doula, on the way to provided additional support.   Encouraged position change and use of peanut ball.   Reviewed red flag symptoms and when to call.   Continue orders as written. Reassess as needed.    Serafina Royals, CNM [facility], [facility] 06/09/2020, 3:17 PM

## 2020-06-09 NOTE — Anesthesia Procedure Notes (Signed)
Epidural Patient location during procedure: OB  Staffing Performed: anesthesiologist   Preanesthetic Checklist Completed: patient identified, IV checked, site marked, risks and benefits discussed, surgical consent, monitors and equipment checked, pre-op evaluation and timeout performed  Epidural Patient position: sitting Prep: Betadine Patient monitoring: heart rate, continuous pulse ox and blood pressure Approach: midline Location: L4-L5 Injection technique: LOR saline  Needle:  Needle type: Tuohy  Needle gauge: 17 G Needle length: 9 cm and 9 Needle insertion depth: 5 cm Catheter type: closed end flexible Catheter size: 19 Gauge Catheter at skin depth: 11 cm Test dose: negative and 1.5% lidocaine with Epi 1:200 K  Assessment Sensory level: T10 Events: blood not aspirated, injection not painful, no injection resistance, no paresthesia and negative IV test  Additional Notes   Patient tolerated the insertion well without complications.-SATD -IVTD. No paresthesia. Refer to [facility] nursing for VS and dosingReason for block:procedure for pain     

## 2020-06-10 ENCOUNTER — Encounter: Payer: Self-pay | Admitting: Certified Nurse Midwife

## 2020-06-10 LAB — COMPREHENSIVE METABOLIC PANEL
ALT: 11 U/L (ref 0–44)
AST: 26 U/L (ref 15–41)
Albumin: 2.6 g/dL — ABNORMAL LOW (ref 3.5–5.0)
Alkaline Phosphatase: 123 U/L (ref 38–126)
Anion gap: 10 (ref 5–15)
BUN: 10 mg/dL (ref 6–20)
CO2: 20 mmol/L — ABNORMAL LOW (ref 22–32)
Calcium: 8.8 mg/dL — ABNORMAL LOW (ref 8.9–10.3)
Chloride: 106 mmol/L (ref 98–111)
Creatinine, Ser: 0.74 mg/dL (ref 0.44–1.00)
GFR, Estimated: >60 mL/min (ref >60)
Glucose, Bld: 72 mg/dL (ref 70–99)
Potassium: 3.9 mmol/L (ref 3.5–5.1)
Sodium: 136 mmol/L (ref 135–145)
Total Bilirubin: 0.7 mg/dL (ref 0.3–1.2)
Total Protein: 5.2 g/dL — ABNORMAL LOW (ref 6.5–8.1)

## 2020-06-10 LAB — CBC
HCT: 37.9 % (ref 36.0–46.0)
Hemoglobin: 13 g/dL (ref 12.0–15.0)
MCH: 29.9 pg (ref 26.0–34.0)
MCHC: 34.3 g/dL (ref 30.0–36.0)
MCV: 87.1 fL (ref 80.0–100.0)
Platelets: 123 10*3/uL — ABNORMAL LOW (ref 150–400)
RBC: 4.35 MIL/uL (ref 3.87–5.11)
RDW: 13.2 % (ref 11.5–15.5)
WBC: 12.1 10*3/uL — ABNORMAL HIGH (ref 4.0–10.5)
nRBC: 0 % (ref 0.0–0.2)

## 2020-06-10 MED ORDER — ACETAMINOPHEN 325 MG PO TABS: 650.0000 mg | ORAL_TABLET | ORAL | 0 refills | Status: DC | PRN

## 2020-06-10 MED ORDER — IBUPROFEN 600 MG PO TABS
600.0000 mg | ORAL_TABLET | Freq: Four times a day (QID) | ORAL | Status: DC
  Administered 2020-06-10 (×2): 600 mg via ORAL
  Filled 2020-06-10 (×2): qty 1

## 2020-06-10 MED ORDER — METOPROLOL TARTRATE 25 MG PO TABS: 25.0000 mg | ORAL_TABLET | Freq: Two times a day (BID) | ORAL | 0 refills | Status: DC

## 2020-06-10 MED ORDER — METOPROLOL TARTRATE 50 MG PO TABS
25.0000 mg | ORAL_TABLET | Freq: Two times a day (BID) | ORAL | Status: DC
  Administered 2020-06-10: 25 mg via ORAL
  Filled 2020-06-10 (×2): qty 0.5

## 2020-06-10 MED ORDER — IBUPROFEN 600 MG PO TABS: 600.0000 mg | ORAL_TABLET | Freq: Four times a day (QID) | ORAL | 0 refills | Status: DC

## 2020-06-10 NOTE — Discharge Instructions (Signed)
Postpartum Care After Vaginal Delivery °This sheet gives you information about how to care for yourself from the time you deliver your baby to up to 6-12 weeks after delivery (postpartum period). Your health care provider may also give you more specific instructions. If you have problems or questions, contact your health care provider. °Follow these instructions at home: °Vaginal bleeding °· It is normal to have vaginal bleeding (lochia) after delivery. Wear a sanitary pad for vaginal bleeding and discharge. °? During the first week after delivery, the amount and appearance of lochia is often similar to a menstrual period. °? Over the next few weeks, it will gradually decrease to a dry, yellow-brown discharge. °? For most women, lochia stops completely by 4-6 weeks after delivery. Vaginal bleeding can vary from woman to woman. °· Change your sanitary pads frequently. Watch for any changes in your flow, such as: °? A sudden increase in volume. °? A change in color. °? Large blood clots. °· If you pass a blood clot from your vagina, save it and call your health care provider to discuss. Do not flush blood clots down the toilet before talking with your health care provider. °· Do not use tampons or douches until your health care provider says this is safe. °· If you are not breastfeeding, your period should return 6-8 weeks after delivery. If you are feeding your child breast milk only (exclusive breastfeeding), your period may not return until you stop breastfeeding. °Perineal care °· Keep the area between the vagina and the anus (perineum) clean and dry as told by your health care provider. Use medicated pads and pain-relieving sprays and creams as directed. °· If you had a cut in the perineum (episiotomy) or a tear in the vagina, check the area for signs of infection until you are healed. Check for: °? More redness, swelling, or pain. °? Fluid or blood coming from the cut or tear. °? Warmth. °? Pus or a bad  smell. °· You may be given a squirt bottle to use instead of wiping to clean the perineum area after you go to the bathroom. As you start healing, you may use the squirt bottle before wiping yourself. Make sure to wipe gently. °· To relieve pain caused by an episiotomy, a tear in the vagina, or swollen veins in the anus (hemorrhoids), try taking a warm sitz bath 2-3 times a day. A sitz bath is a warm water bath that is taken while you are sitting down. The water should only come up to your hips and should cover your buttocks. °Breast care °· Within the first few days after delivery, your breasts may feel heavy, full, and uncomfortable (breast engorgement). Milk may also leak from your breasts. Your health care provider can suggest ways to help relieve the discomfort. Breast engorgement should go away within a few days. °· If you are breastfeeding: °? Wear a bra that supports your breasts and fits you well. °? Keep your nipples clean and dry. Apply creams and ointments as told by your health care provider. °? You may need to use breast pads to absorb milk that leaks from your breasts. °? You may have uterine contractions every time you breastfeed for up to several weeks after delivery. Uterine contractions help your uterus return to its normal size. °? If you have any problems with breastfeeding, work with your health care provider or lactation consultant. °· If you are not breastfeeding: °? Avoid touching your breasts a lot. Doing this can make   your breasts produce more milk. °? Wear a good-fitting bra and use cold packs to help with swelling. °? Do not squeeze out (express) milk. This causes you to make more milk. °Intimacy and sexuality °· Ask your health care provider when you can engage in sexual activity. This may depend on: °? Your risk of infection. °? How fast you are healing. °? Your comfort and desire to engage in sexual activity. °· You are able to get pregnant after delivery, even if you have not had  your period. If desired, talk with your health care provider about methods of birth control (contraception). °Medicines °· Take over-the-counter and prescription medicines only as told by your health care provider. °· If you were prescribed an antibiotic medicine, take it as told by your health care provider. Do not stop taking the antibiotic even if you start to feel better. °Activity °· Gradually return to your normal activities as told by your health care provider. Ask your health care provider what activities are safe for you. °· Rest as much as possible. Try to rest or take a nap while your baby is sleeping. °Eating and drinking ° °· Drink enough fluid to keep your urine pale yellow. °· Eat high-fiber foods every day. These may help prevent or relieve constipation. High-fiber foods include: °? Whole grain cereals and breads. °? Brown rice. °? Beans. °? Fresh fruits and vegetables. °· Do not try to lose weight quickly by cutting back on calories. °· Take your prenatal vitamins until your postpartum checkup or until your health care provider tells you it is okay to stop. °Lifestyle °· Do not use any products that contain nicotine or tobacco, such as cigarettes and e-cigarettes. If you need help quitting, ask your health care provider. °· Do not drink alcohol, especially if you are breastfeeding. °General instructions °· Keep all follow-up visits for you and your baby as told by your health care provider. Most women visit their health care provider for a postpartum checkup within the first 3-6 weeks after delivery. °Contact a health care provider if: °· You feel unable to cope with the changes that your child brings to your life, and these feelings do not go away. °· You feel unusually sad or worried. °· Your breasts become red, painful, or hard. °· You have a fever. °· You have trouble holding urine or keeping urine from leaking. °· You have little or no interest in activities you used to enjoy. °· You have not  breastfed at all and you have not had a menstrual period for 12 weeks after delivery. °· You have stopped breastfeeding and you have not had a menstrual period for 12 weeks after you stopped breastfeeding. °· You have questions about caring for yourself or your baby. °· You pass a blood clot from your vagina. °Get help right away if: °· You have chest pain. °· You have difficulty breathing. °· You have sudden, severe leg pain. °· You have severe pain or cramping in your lower abdomen. °· You bleed from your vagina so much that you fill more than one sanitary pad in one hour. Bleeding should not be heavier than your heaviest period. °· You develop a severe headache. °· You faint. °· You have blurred vision or spots in your vision. °· You have bad-smelling vaginal discharge. °· You have thoughts about hurting yourself or your baby. °If you ever feel like you may hurt yourself or others, or have thoughts about taking your own life, get help   right away. You can go to the nearest emergency department or call:  Your local emergency services (911 in the [place]  A suicide crisis helpline, such as the National Suicide Prevention Lifeline at 1-800-273-8255. This is open 24 hours a day. Summary  The period of time right after you deliver your newborn up to 6-12 weeks after delivery is called the postpartum period.  Gradually return to your normal activities as told by your health care provider.  Keep all follow-up visits for you and your baby as told by your health care provider. This information is not intended to replace advice given to you by your health care provider. Make sure you discuss any questions you have with your health care provider. Document Revised: 07/01/2017 Document Reviewed: 04/11/2017 Elsevier Patient Education  2020 [employer]  Postpartum Baby Blues The postpartum period begins right after the birth of a baby. During this time, there is often a lot of joy and excitement. It is also a  time of many changes in the life of the parents. No matter how many times a mother gives birth, each child brings new challenges to the family, including different ways of relating to one another. It is common to have feelings of excitement along with confusing changes in moods, emotions, and thoughts. You may feel happy one minute and sad or stressed the next. These feelings of sadness usually happen in the period right after you have your baby, and they go away within a week or two. This is called the "baby blues." What are the causes? There is no known cause of baby blues. It is likely caused by a combination of factors. However, changes in hormone levels after childbirth are believed to trigger some of the symptoms. Other factors that can play a role in these mood changes include:  Lack of sleep.  Stressful life events, such as poverty, caring for a loved one, or death of a loved one.  Genetics. What are the signs or symptoms? Symptoms of this condition include:  Brief changes in mood, such as going from extreme happiness to sadness.  Decreased concentration.  Difficulty sleeping.  Crying spells and tearfulness.  Loss of appetite.  Irritability.  Anxiety. If the symptoms of baby blues last for more than 2 weeks or become more severe, you may have postpartum depression. How is this diagnosed? This condition is diagnosed based on an evaluation of your symptoms. There are no medical or lab tests that lead to a diagnosis, but there are various questionnaires that a health care provider may use to identify women with the baby blues or postpartum depression. How is this treated? Treatment is not needed for this condition. The baby blues usually go away on their own in 1-2 weeks. Social support is often all that is needed. You will be encouraged to get adequate sleep and rest. Follow these instructions at home: Lifestyle      Get as much rest as you can. Take a nap when the baby  sleeps.  Exercise regularly as told by your health care provider. Some women find yoga and walking to be helpful.  Eat a balanced and nourishing diet. This includes plenty of fruits and vegetables, whole grains, and lean proteins.  Do little things that you enjoy. Have a cup of tea, take a bubble bath, read your favorite magazine, or listen to your favorite music.  Avoid alcohol.  Ask for help with household chores, cooking, grocery shopping, or running errands. Do not try to   to do everything yourself. Consider hiring a postpartum doula to help. This is a professional who specializes in providing support to new mothers.  Try not to make any major life changes during pregnancy or right after giving birth. This can add stress. General instructions  Talk to people close to you about how you are feeling. Get support from your partner, family members, friends, or other new moms. You may want to join a support group.  Find ways to cope with stress. This may include: ? Writing your thoughts and feelings in a journal. ? Spending time outside. ? Spending time with people who make you laugh.  Try to stay positive in how you think. Think about the things you are grateful for.  Take over-the-counter and prescription medicines only as told by your health care provider.  Let your health care provider know if you have any concerns.  Keep all postpartum visits as told by your health care provider. This is important. Contact a health care provider if:  Your baby blues do not go away after 2 weeks. Get help right away if:  You have thoughts of taking your own life (suicidal thoughts).  You think you may harm the baby or other people.  You see or hear things that are not there (hallucinations). Summary  After giving birth, you may feel happy one minute and sad or stressed the next. Feelings of sadness that happen right after the baby is born and go away after a week or two are called the "baby  blues."  You can manage the baby blues by getting enough rest, eating a healthy diet, exercising, spending time with supportive people, and finding ways to cope with stress.  If feelings of sadness and stress last longer than 2 weeks or get in the way of caring for your baby, talk to your health care provider. This may mean you have postpartum depression. This information is not intended to replace advice given to you by your health care provider. Make sure you discuss any questions you have with your health care provider. Document Revised: 10/20/2018 Document Reviewed: 08/24/2016 Elsevier Patient Education  2020 [employer] Inc[employer]   Metoprolol Tablets What is this medicine? METOPROLOL (me TOE proe lole) is a beta blocker. It decreases the amount of work your heart has to do and helps your heart beat regularly. It is used to treat high blood pressure and/or prevent chest pain (also called angina). It is also used after a heart attack to prevent a second one. This medicine may be used for other purposes; ask your health care provider or pharmacist if you have questions. COMMON BRAND NAME(S): Lopressor What should I tell my health care provider before I take this medicine? They need to know if you have any of these conditions:  diabetes  heart or vessel disease like slow heart rate, worsening heart failure, heart block, sick sinus syndrome or Raynaud's disease  kidney disease  liver disease  lung or breathing disease, like asthma or emphysema  pheochromocytoma  thyroid disease  an unusual or allergic reaction to metoprolol, other beta-blockers, medicines, foods, dyes, or preservatives  pregnant or trying to get pregnant  breast-feeding How should I use this medicine? Take this drug by mouth with water. Take it as directed on the prescription label at the same time every day. You can take it with or without food. You should always take it the same way. Keep taking it unless your health  care provider tells you to stop.  Talk to your health care provider about the use of this drug in children. Special care may be needed. Overdosage: If you think you have taken too much of this medicine contact a poison control center or emergency room at once. NOTE: This medicine is only for you. Do not share this medicine with others. What if I miss a dose? If you miss a dose, take it as soon as you can. If it is almost time for your next dose, take only that dose. Do not take double or extra doses. What may interact with this medicine? This medicine may interact with the following medications:  certain medicines for blood pressure, heart disease, irregular heart beat  certain medicines for depression like monoamine oxidase (MAO) inhibitors, fluoxetine, or paroxetine  clonidine  dobutamine  epinephrine  isoproterenol  reserpine This list may not describe all possible interactions. Give your health care provider a list of all the medicines, herbs, non-prescription drugs, or dietary supplements you use. Also tell them if you smoke, drink alcohol, or use illegal drugs. Some items may interact with your medicine. What should I watch for while using this medicine? Visit your doctor or health care professional for regular check ups. Contact your doctor right away if your symptoms worsen. Check your blood pressure and pulse rate regularly. Ask your health care professional what your blood pressure and pulse rate should be, and when you should contact them. You may get drowsy or dizzy. Do not drive, use machinery, or do anything that needs mental alertness until you know how this medicine affects you. Do not sit or stand up quickly, especially if you are an older patient. This reduces the risk of dizzy or fainting spells. Contact your doctor if these symptoms continue. Alcohol may interfere with the effect of this medicine. Avoid alcoholic drinks. This medicine may increase blood sugar. Ask your  healthcare provider if changes in diet or medicines are needed if you have diabetes. What side effects may I notice from receiving this medicine? Side effects that you should report to your doctor or health care professional as soon as possible:  allergic reactions like skin rash, itching or hives  cold or numb hands or feet  depression  difficulty breathing  faint  fever with sore throat  irregular heartbeat, chest pain  rapid weight gain   signs and symptoms of high blood sugar such as being more thirsty or hungry or having to urinate more than normal. You may also feel very tired or have blurry vision.  swollen legs or ankles Side effects that usually do not require medical attention (report to your doctor or health care professional if they continue or are bothersome):  anxiety or nervousness  change in sex drive or performance  dry skin  headache  nightmares or trouble sleeping  short term memory loss  stomach upset or diarrhea This list may not describe all possible side effects. Call your doctor for medical advice about side effects. You may report side effects to FDA at 1-800-FDA-1088. Where should I keep my medicine? Keep out of the reach of children and pets. Store at room temperature between 15 and 30 degrees C (59 and 86 degrees F). Protect from moisture. Keep the container tightly closed. Throw away any unused drug after the expiration date. NOTE: This sheet is a summary. It may not cover all possible information. If you have questions about this medicine, talk to your doctor, pharmacist, or health care provider.  2020 Elsevier/Gold Standard (2019-02-08 17:21:17)

## 2020-06-10 NOTE — Progress Notes (Signed)
All discharge instructions reviewed at 1925; pt breastfeeding; after breastfeeding, baby was weighed, had diaper changed, vasoline applied to circumcision site, cord clamp removed and security device removed; pt discharged via wheelchair holding baby in her ams; escorted by nurse tech to ED entrance; pt going home with husband and baby

## 2020-06-10 NOTE — Progress Notes (Addendum)
Patient ID: Tamara Mills, female   DOB: 03/12/1991, 29 y.o.   MRN: [id number]  Post Partum Day # 1, s/p spontaneous vaginal birth after induction of labor due to chronic hypertension, Rh positive, GBS negative, Breastfeeding  Subjective:  Patient sitting in bed, nursing infant. FOB at bedside for support.   Desires discharge later today if possible.  Denies difficulty breathing or respiratory distress, chest pain, abdominal pain, excessive vaginal bleeding, dysuria, and leg pain or swelling.   Objective: Temp:  [97.7 F (36.5 C)-98.7 F (37.1 C)] 98 F (36.7 C) (11/30 0810) Pulse Rate:  [48-157] 62 (11/30 0813) Resp:  [18-20] 20 (11/30 0810) BP: (106-151)/(17-99) 140/95 (11/30 0810) SpO2:  [95 %-99 %] 99 % (11/30 0810)  Physical Exam:   General: alert and cooperative   Lungs: clear to auscultation bilaterally  Breasts: normal appearance, no masses or tenderness  Heart: normal apical impulse  Abdomen: soft, non-tender; bowel sounds normal; no masses,  no organomegaly  Pelvis: Lochia: appropriate, Uterine Fundus:  firm  Extremities: DVT Evaluation: No evidence of DVT seen on physical exam.  Recent Labs    06/09/20 0521 06/10/20 0435  HGB 14.0 13.0  HCT 40.9 37.9    Assessment:  29 year old female G2P2, Post Partum Day # 1, s/p spontaneous vaginal birth after induction of labor due to chronic hypertension, Rh positive, GBS negative  Breastfeeding  Plan:  Encouraged lactation consult.   Routine postpartum care and orders.   Reviewed red flag symptoms and when to call.   Blood pressure borderline, will monitor throughout the day and consider discharge this evening.   Update given to Dr. Logan Bores.    LOS: 1 day   Gunnar Bulla, CNM [facility] 06/10/2020 10:25 AM

## 2020-06-10 NOTE — Discharge Summary (Signed)
Obstetric Discharge Summary  Patient ID: Tamara Mills MRN: [id number] DOB/AGE: 10/13/1990 29 y.o.   Date of Admission: 06/09/2020  Date of Discharge:  06/10/20  Admitting Diagnosis: Induction of labor at [redacted]w[redacted]d  Secondary Diagnosis:  Patient Active Problem List   Diagnosis Date Noted   Encounter for induction of labor 06/09/2020   [redacted] weeks gestation of pregnancy    Echogenic intracardiac focus of fetus on prenatal ultrasound 01/31/2020   Chronic hypertension affecting pregnancy 11/30/2019   History of gestational hypertension 11/30/2019   Oral contraceptive use 08/23/2019   Diarrhea 11/16/2018   Essential hypertension 11/04/2017   Family history of sudden cardiac death 06-27-16   Fatigue 05/22/2015   Bruising 05-22-15   Acne vulgaris 04/28/2012   Rectal leakage 01/27/2011   Anxiety disorder 02/28/2007   DEFORMITY, CAVUS, FOOT, ACQUIRED 02/23/2007     Mode of Delivery: normal spontaneous vaginal delivery     Discharge Diagnosis: No other diagnosis   Intrapartum Procedures: epidural and Cytotec induction   Post partum procedures: None  Complications: Periurethral  laceration, unrepaired   Brief Hospital Course   LAKOTA SCHWEPPE is a [id number] who had a SVD on 06/09/2020;  for further details of this birth, please refer to the delivey summary.  Patient had an uncomplicated postpartum course except for elevated blood pressure requiring the initiation of oral metoprolol.  By time of patient requested discharge on PPD#1, her pain was controlled on oral pain medications; she had appropriate lochia and was ambulating, voiding without difficulty and tolerating regular diet.  She was deemed stable for discharge to home with close follow up. Patient works as Teacher, early years/pre and agrees to monitor blood pressures daily and follow up in office Friday for official check.    Labs: CBC Latest Ref Rng & Units 06/10/2020 06/09/2020 04/10/2020  WBC 4.0 - 10.5 K/uL 12.1(H) 8.6 8.3   Hemoglobin 12.0 - 15.0 g/dL 51.8 84.1 66.0  Hematocrit 36 - 46 % 37.9 40.9 40.1  Platelets 150 - 400 K/uL 123(L) 149(L) 198   O POS Performed at [facility], [address] [place], [place] [address]   Physical exam:   Temp:  [97.7 F (36.5 C)-98.7 F (37.1 C)] 98 F (36.7 C) (11/30 1512) Pulse Rate:  [48-66] 59 (11/30 1512) Resp:  [16-20] 20 (11/30 1512) BP: (124-158)/(72-97) 140/94 (11/30 1512) SpO2:  [95 %-99 %] 99 % (11/30 1512)  General: alert and no distress  Lochia: appropriate  Abdomen: soft, NT  Uterine Fundus: firm  Laceration: healing well, no significant drainage, no dehiscence, no significant erythema  Extremities: No evidence of DVT seen on physical exam. No lower extremity edema  Edinburgh Postnatal Depression Scale Screening Tool 06/10/2020  I have been able to laugh and see the funny side of things. 0  I have looked forward with enjoyment to things. 0  I have blamed myself unnecessarily when things went wrong. 0  I have been anxious or worried for no good reason. 2  I have felt scared or panicky for no good reason. 0  Things have been getting on top of me. 0  I have been so unhappy that I have had difficulty sleeping. 0  I have felt sad or miserable. 0  I have been so unhappy that I have been crying. 0  The thought of harming myself has occurred to me. 0  Edinburgh Postnatal Depression Scale Total 2     Discharge Instructions: Per After Visit Summary.  Activity: Advance as tolerated. Pelvic rest  for 6 weeks.  Also refer to After Visit Summary  Diet: Regular  Medications: Allergies as of 06/10/2020      Reactions   Ortho Tri-cyclen [norgestimate-eth Estradiol]    nausea   Nickel Rash      Medication List    STOP taking these medications   aspirin EC 81 MG tablet     TAKE these medications   acetaminophen 325 MG tablet Commonly known as: Tylenol Take 2 tablets (650 mg total) by mouth every 4 (four) hours as needed for  mild pain.   ibuprofen 600 MG tablet Commonly known as: ADVIL Take 1 tablet (600 mg total) by mouth every 6 (six) hours.   metoprolol tartrate 25 MG tablet Commonly known as: LOPRESSOR Take 1 tablet (25 mg total) by mouth 2 (two) times daily.   prenatal multivitamin Tabs tablet Take 1 tablet by mouth daily at 12 noon.      Outpatient follow up:   Follow-up Information    Gunnar Bulla, CNM. Call.   Specialties: Certified Nurse Midwife, Obstetrics and Gynecology, Radiology Why: Blood pressure check on Friday Two week televisit (phone) for mood check Six week postpartum visit Contact information: [address] [place] [address] [phone]              Postpartum contraception: oral progesterone-only contraceptive  Discharged Condition: stable  Discharged to: home   Newborn Data:  Disposition:home with mother  Apgars: APGAR (1 MIN): 9   APGAR (5 MINS): 9     Baby Feeding: Breast   Serafina Royals, CNM  [facility], [facility] 06/10/20 6:37 PM

## 2020-06-10 NOTE — Lactation Note (Signed)
This note was copied from a baby's chart. Lactation Consultation Note  Patient Name: Tamara Mills [id number] Date: 06/10/2020 Reason for consult: Follow-up assessment;Mother's request;Early term 74-38.6wks  Lactation called in to observe/assist with feeding attempt. Mom desires assistance with learning new technique to ensure baby is latched well and transferring.  LC assisted with addition of support pillows, talked mom through cross-cradle, sandwiching of breast tissue behind the areolar, nose to nipple, and tummy turned in. LC brought baby to mom, assisted with placement of arms/hands in cross-cradle. Baby grasped the breast easily, began strong rhythmic sucking pattern with audible swallows.  LC provided tips for keeping baby alert/awake, reviewed signs of adequate transfer through out put and audible swallows, breast warmth/massage/compression throughout the feed. Reviewed breast fullness and engorgement and management of both, and overall breast and nipple care. Information given for outpatient lactation services and community breastfeeding support. Encouraged mom to seek support as needed.   Maternal Data Formula Feeding for Exclusion: No Has patient been taught Hand Expression?: Yes Does the patient have breastfeeding experience prior to this delivery?: Yes  Feeding Feeding Type: Breast Fed  LATCH Score Latch: Grasps breast easily, tongue down, lips flanged, rhythmical sucking.  Audible Swallowing: Spontaneous and intermittent  Type of Nipple: Everted at rest and after stimulation  Comfort (Breast/Nipple): Soft / non-tender  Hold (Positioning): Assistance needed to correctly position infant at breast and maintain latch. (trialed cross-cradle)  LATCH Score: 9  Interventions Interventions: Breast feeding basics reviewed;Assisted with latch;Adjust position;Support pillows;Position options  Lactation Tools Discussed/Used     Consult Status Consult Status: Complete Date:  06/10/20 Follow-up type: Call as needed    Danford Bad 06/10/2020, 4:22 PM

## 2020-06-10 NOTE — Lactation Note (Signed)
This note was copied from a baby's chart. Lactation Consultation Note  Patient Name: Tamara Mills [id number] Date: 06/10/2020 Reason for consult: Follow-up assessment;Early term 37-38.6wks  Initial lactation visit, mom expressed desire to work with [facility] team in finding comfortable positions, how to hold and support baby, and become more confident in breastfeeding. Overall parents feel that baby is BF well, no pain or discomfort, latching without a shield and with minimal assistance. Baby has had several void/stools since delivery and is on the schedule to be circumcised.  LC and LC student provided parents with basic breastfeeding education: newborn stomach size, feeding cues and patterns, newborn behaviors, output expectations, and how circ may make baby sleepy for a period of time. Mom has been taught hand expression and is comfortable with the technique. Collection vials have been given, mom encouraged to express every 2-3 hours as baby rests, between feeds, and before feeds if necessary to soften the breast tissue.  Baby was sleepy at time of lactation follow-up. LC used demonstration doll to help mom with positioning, and support pillow placement, encouraging to bring baby to breast and not breast to baby, ensuring baby is turned in, wide open mouth with flanged lips for deepest latch and optimal transfer.  Information given for signs of adequate intake, identifying swallows and tracking output for the next couple of weeks. Information for outpatient lactation services and community breastfeeding resources provided. Family encouraged to call for ongoing breastfeeding support as needed.  Maternal Data Formula Feeding for Exclusion: No Has patient been taught Hand Expression?: Yes Does the patient have breastfeeding experience prior to this delivery?: Yes  Feeding Feeding Type: Breast Fed  LATCH Score                   Interventions Interventions: Breast feeding basics  reviewed  Lactation Tools Discussed/Used     Consult Status Consult Status: Follow-up Date: 06/10/20 Follow-up type: Call as needed    Danford Bad 06/10/2020, 2:10 PM

## 2020-06-10 NOTE — Anesthesia Postprocedure Evaluation (Signed)
Anesthesia Post Note  Patient: Tamara Mills  Procedure(s) Performed: AN AD HOC LABOR EPIDURAL  Patient location during evaluation: Mother Baby Anesthesia Type: Epidural Level of consciousness: awake and alert Pain management: pain level controlled Vital Signs Assessment: post-procedure vital signs reviewed and stable Respiratory status: spontaneous breathing, nonlabored ventilation and respiratory function stable Cardiovascular status: stable Postop Assessment: no headache, no backache and epidural receding Anesthetic complications: no   No complications documented.   Last Vitals:  Vitals:   06/09/20 2349 06/10/20 0302  BP: 130/75 134/75  Pulse: (!) 49 (!) 50  Resp:  20  Temp: 37 C 36.7 C  SpO2: 99% 99%    Last Pain:  Vitals:   06/10/20 0302  TempSrc: Oral  PainSc:                  [name]

## 2020-06-11 ENCOUNTER — Encounter: Payer: Self-pay | Admitting: Family Medicine

## 2020-06-13 ENCOUNTER — Other Ambulatory Visit: Payer: Self-pay

## 2020-06-13 ENCOUNTER — Encounter: Payer: Managed Care, Other (non HMO) | Admitting: Certified Nurse Midwife

## 2020-06-13 ENCOUNTER — Ambulatory Visit: Payer: Managed Care, Other (non HMO) | Admitting: Family Medicine

## 2020-06-13 ENCOUNTER — Encounter: Payer: Self-pay | Admitting: Family Medicine

## 2020-06-13 VITALS — BP 133/80 | HR 80 | Temp 96.9°F | Ht 66.0 in | Wt 159.5 lb

## 2020-06-13 DIAGNOSIS — F411 Generalized anxiety disorder: Secondary | ICD-10-CM | POA: Diagnosis not present

## 2020-06-13 DIAGNOSIS — I10 Essential (primary) hypertension: Secondary | ICD-10-CM

## 2020-06-13 NOTE — Assessment & Plan Note (Signed)
bp did not increase until very end of the pregnancy /delivery  Has improved at home on metoprolol 25 mg bid  No edema/HA or other symptoms   Will continue to watch BP: 133/80  Disc parameters for low bp/or dizzy and we will scale back on medication

## 2020-06-13 NOTE — Patient Instructions (Addendum)
Take care of yourself  Keep me posted about the anxiety  Continue the metoprolol   If BP starts to get low 90s/50s or dizzy - we can cut it back

## 2020-06-13 NOTE — Assessment & Plan Note (Signed)
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

## 2020-06-13 NOTE — Progress Notes (Signed)
Subjective:    Patient ID: Tamara Mills, female    DOB: 1990-09-05, 29 y.o.   MRN: [id number]  This visit occurred during the SARS-CoV-2 public health emergency.  Safety protocols were in place, including screening questions prior to the visit, additional usage of staff PPE, and extensive cleaning of exam room while observing appropriate contact time as indicated for disinfecting solutions.    HPI Pt presents for elevated bp s/p delivery of newborn  Wt Readings from Last 3 Encounters:  06/13/20 159 lb 8 oz (72.3 kg)  06/09/20 175 lb (79.4 kg)  06/02/20 174 lb 8 oz (79.2 kg)   25.74 kg/m  Did not have much trouble with BP until the end  Was induced  Right after delivery 160s/90s  Better since coming home   120s-140/80s  No longer on ibuprofen   Appetite is ok  Getting good fluids   Good delivery  Had 5 min of pushing  First deg tear-no sutures (still sore, not bad)   Mood -struggling some  Anxiety is bad (for instance will worry about things )  Does not feel she needs medicine now  occ glass of wine   Sleep is good (new baby-gets interrupted_ Appetite is good  Not depressed today - does not feel down or hopeless H/o post partum mood change in the past-will monitor closely for this  Rev s/s of pp depression   Stress-in laws are there/ a little tough     BP Readings from Last 3 Encounters:  06/13/20 (!) 142/84  06/10/20 (!) 144/98  06/02/20 139/87   BP: 133/80  Re check after sitting  Pulse Readings from Last 3 Encounters:  06/13/20 80  06/10/20 (!) 55  05/22/20 (!) 59   Lab Results  Component Value Date   WBC 12.1 (H) 06/10/2020   HGB 13.0 06/10/2020   HCT 37.9 06/10/2020   MCV 87.1 06/10/2020   PLT 123 (L) 06/10/2020   Lab Results  Component Value Date   CREATININE 0.74 06/10/2020   BUN 10 06/10/2020   NA 136 06/10/2020   K 3.9 06/10/2020   CL 106 06/10/2020   CO2 20 (L) 06/10/2020   Patient Active Problem List   Diagnosis Date Noted  .  Encounter for induction of labor 06/09/2020  . [redacted] weeks gestation of pregnancy   . Echogenic intracardiac focus of fetus on prenatal ultrasound 01/31/2020  . Chronic hypertension affecting pregnancy 11/30/2019  . History of gestational hypertension 11/30/2019  . Oral contraceptive use 08/23/2019  . Essential hypertension 11/04/2017  . Family history of sudden cardiac death 07/04/16  . Fatigue 29-May-2015  . Bruising 29-May-2015  . Acne vulgaris 04/28/2012  . Rectal leakage 01/27/2011  . Anxiety disorder 02/28/2007  . DEFORMITY, CAVUS, FOOT, ACQUIRED 02/23/2007   Past Medical History:  Diagnosis Date  . Acne   . Amenorrhea    Hx of  . Anxiety   . COVID-19   . Eating disorder    ? eating disorder in past ( pt states just running too much)  . Hand eczema   . Hypertension   . Seasonal allergies   . Underweight    Hx of   Past Surgical History:  Procedure Laterality Date  . WISDOM TOOTH EXTRACTION     Social History   Tobacco Use  . Smoking status: Never Smoker  . Smokeless tobacco: Never Used  Vaping Use  . Vaping Use: Never used  Substance Use Topics  . Alcohol use: Not Currently  Comment: occasional  . Drug use: No   Family History  Problem Relation Age of Onset  . Crohn's disease Brother   . Ulcerative colitis Brother   . Sudden Cardiac Death Brother   . Hypertension Mother        had also preeclampsia  . Diabetes Maternal Grandmother        Type 1  . Cancer Paternal Grandmother 57       breast  . Breast cancer Paternal Grandmother   . Heart failure Paternal Grandfather        heart disease  . Cancer Paternal Aunt 40       breast  . Breast cancer Paternal Aunt   . Ovarian cancer Neg Hx   . Colon cancer Neg Hx   . Esophageal cancer Neg Hx   . Rectal cancer Neg Hx   . Stomach cancer Neg Hx    Allergies  Allergen Reactions  . Ortho Tri-Cyclen [Norgestimate-Eth Estradiol]     nausea  . Nickel Rash   Current Outpatient Medications on File Prior  to Visit  Medication Sig Dispense Refill  . acetaminophen (TYLENOL) 325 MG tablet Take 2 tablets (650 mg total) by mouth every 4 (four) hours as needed for mild pain. 60 tablet 0  . metoprolol tartrate (LOPRESSOR) 25 MG tablet Take 1 tablet (25 mg total) by mouth 2 (two) times daily. 60 tablet 0  . Prenatal Vit-Fe Fumarate-FA (PRENATAL MULTIVITAMIN) TABS tablet Take 1 tablet by mouth daily at 12 noon.     No current facility-administered medications on file prior to visit.    Review of Systems  Constitutional: Positive for fatigue. Negative for activity change, appetite change, fever and unexpected weight change.  HENT: Negative for congestion, ear pain, rhinorrhea, sinus pressure and sore throat.   Eyes: Negative for pain, redness and visual disturbance.  Respiratory: Negative for cough, shortness of breath and wheezing.   Cardiovascular: Negative for chest pain and palpitations.  Gastrointestinal: Negative for abdominal pain, blood in stool, constipation and diarrhea.  Endocrine: Negative for polydipsia and polyuria.  Genitourinary: Negative for dysuria, frequency and urgency.  Musculoskeletal: Negative for arthralgias, back pain and myalgias.  Skin: Negative for pallor and rash.  Allergic/Immunologic: Negative for environmental allergies.  Neurological: Negative for dizziness, syncope and headaches.  Hematological: Negative for adenopathy. Does not bruise/bleed easily.  Psychiatric/Behavioral: Negative for decreased concentration, dysphoric mood, sleep disturbance and suicidal ideas. The patient is nervous/anxious.        Objective:   Physical Exam Constitutional:      General: She is not in acute distress.    Appearance: Normal appearance. She is well-developed and normal weight.  HENT:     Head: Normocephalic and atraumatic.  Eyes:     Conjunctiva/sclera: Conjunctivae normal.     Pupils: Pupils are equal, round, and reactive to light.  Neck:     Thyroid: No thyromegaly.      Vascular: No carotid bruit or JVD.  Cardiovascular:     Rate and Rhythm: Normal rate and regular rhythm.     Heart sounds: Normal heart sounds. No gallop.   Pulmonary:     Effort: Pulmonary effort is normal. No respiratory distress.     Breath sounds: Normal breath sounds. No wheezing or rales.  Abdominal:     General: Bowel sounds are normal. There is no distension or abdominal bruit.     Palpations: Abdomen is soft. There is no mass.     Tenderness: There is no abdominal  tenderness.  Musculoskeletal:     Cervical back: Normal range of motion and neck supple.     Right lower leg: No edema.     Left lower leg: No edema.  Lymphadenopathy:     Cervical: No cervical adenopathy.  Skin:    General: Skin is warm and dry.     Findings: No rash.  Neurological:     Mental Status: She is alert.     Sensory: No sensory deficit.     Coordination: Coordination normal.     Deep Tendon Reflexes: Reflexes are normal and symmetric. Reflexes normal.  Psychiatric:        Mood and Affect: Mood normal.           Assessment & Plan:   Problem List Items Addressed This Visit      Cardiovascular and Mediastinum   Essential hypertension - Primary    bp did not increase until very end of the pregnancy /delivery  Has improved at home on metoprolol 25 mg bid  No edema/HA or other symptoms   Will continue to watch BP: 133/80  Disc parameters for low bp/or dizzy and we will scale back on medication         Other   Anxiety disorder    Some worse anxiety and worry after delivery of newborn  Does not feel like she needs medication at this time  Will watch closely for pp depression (explained importance of treating this)   Gradually getting in some self care and eventually will be able to exercise again  inst to call if symptoms worsen Has good support  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

## 2020-06-17 ENCOUNTER — Encounter: Payer: Self-pay | Admitting: Family Medicine

## 2020-06-17 DIAGNOSIS — F411 Generalized anxiety disorder: Secondary | ICD-10-CM

## 2020-06-19 ENCOUNTER — Inpatient Hospital Stay: Admit: 2020-06-19 | Payer: Self-pay

## 2020-06-26 ENCOUNTER — Ambulatory Visit (INDEPENDENT_AMBULATORY_CARE_PROVIDER_SITE_OTHER): Payer: 59 | Admitting: Psychology

## 2020-06-26 DIAGNOSIS — F411 Generalized anxiety disorder: Secondary | ICD-10-CM | POA: Diagnosis not present

## 2020-06-27 ENCOUNTER — Other Ambulatory Visit: Payer: Self-pay

## 2020-06-27 ENCOUNTER — Encounter: Payer: Self-pay | Admitting: Certified Nurse Midwife

## 2020-06-27 ENCOUNTER — Ambulatory Visit (INDEPENDENT_AMBULATORY_CARE_PROVIDER_SITE_OTHER): Payer: Managed Care, Other (non HMO) | Admitting: Certified Nurse Midwife

## 2020-06-27 DIAGNOSIS — O1093 Unspecified pre-existing hypertension complicating the puerperium: Secondary | ICD-10-CM

## 2020-06-27 DIAGNOSIS — Z1331 Encounter for screening for depression: Secondary | ICD-10-CM

## 2020-06-27 DIAGNOSIS — I10 Essential (primary) hypertension: Secondary | ICD-10-CM

## 2020-06-27 DIAGNOSIS — F419 Anxiety disorder, unspecified: Secondary | ICD-10-CM

## 2020-06-27 NOTE — Progress Notes (Signed)
2 week postpartum televisit. Pt has light spotting which increases after BM. BM without difficulty.  Breastfeeding the baby. No intercourse.

## 2020-06-27 NOTE — Progress Notes (Signed)
Virtual Visit via Telephone Note  I connected with Jodie Cavey Hatler on 06/27/20 at  4:30 PM EST by telephone and verified that I am speaking with the correct person using two identifiers.  Location:  Patient: Tamara Mills (home)  Provider: Serafina Royals, CNM ([facility], [facility])   I discussed the limitations, risks, security and privacy concerns of performing an evaluation and management service by telephone and the availability of in person appointments. I also discussed with the patient that there may be a patient responsible charge related to this service. The patient expressed understanding and agreed to proceed.   History of Present Illness:  Patient is two (2) weeks status post induced vaginal birth of live female infant at term due to history of chronic hypertension.   Monitoring blood pressures at home. Taking Metoprolol twice daily as needed; however, has held a few doses due to hypotension and bradycardia.   Notes spotting after bowel movements only, questions hemorrhoids. Nursing without difficulties.   Starting speaking virtually with counselor this week and plans to continue weekly sessions at this time.   Denies difficulty breathing or respiratory distress, chest pain, abdominal pain, excessive vaginal bleeding, dysuria, and leg pain or swelling.   Observations/Objective:  GAD 7 : Generalized Anxiety Score 06/27/2020  Nervous, Anxious, on Edge 2  Control/stop worrying 1  Worry too much - different things 2  Trouble relaxing 0  Restless 0  Easily annoyed or irritable 0  Afraid - awful might happen 1  Total GAD 7 Score 6    Depression screen [facility] 2/9 06/27/2020 08/23/2019 10/18/2017  Decreased Interest 0 0 0  Down, Depressed, Hopeless 0 0 0  PHQ - 2 Score 0 0 0  Altered sleeping 0 0 0  Tired, decreased energy 0 0 0  Change in appetite 0 0 0  Feeling bad or failure about yourself  0 0 0  Trouble concentrating 0 0 0  Moving slowly or fidgety/restless 0 0 0   Suicidal thoughts 0 0 0  PHQ-9 Score 0 0 0  Difficult doing work/chores - Not difficult at all -    Assessment:  Postpartum care and examination Lactating mother Depression screen negative Anxiety Chronic hypertension  Plan:  Praise given for starting counseling.  Emotional support provided.   Routine postpartum education.   Reviewed red flag symptoms and when to call.   RTC x 4 weeks for PPV or sooner if needed.    Follow Up Instructions:    I discussed the assessment and treatment plan with the patient. The patient was provided an opportunity to ask questions and all were answered. The patient agreed with the plan and demonstrated an understanding of the instructions.   The patient was advised to call back or seek an in-person evaluation if the symptoms worsen or if the condition fails to improve as anticipated.  I provided 6 minutes of non-face-to-face time during this encounter.   Serafina Royals, CNM [facility], [facility] 06/27/20 11:39 AM

## 2020-06-30 ENCOUNTER — Ambulatory Visit (INDEPENDENT_AMBULATORY_CARE_PROVIDER_SITE_OTHER): Payer: 59 | Admitting: Psychology

## 2020-06-30 DIAGNOSIS — F411 Generalized anxiety disorder: Secondary | ICD-10-CM | POA: Diagnosis not present

## 2020-07-03 ENCOUNTER — Other Ambulatory Visit: Payer: Self-pay | Admitting: Certified Nurse Midwife

## 2020-07-17 ENCOUNTER — Telehealth: Payer: Self-pay

## 2020-07-17 ENCOUNTER — Other Ambulatory Visit: Payer: Self-pay | Admitting: Certified Nurse Midwife

## 2020-07-17 ENCOUNTER — Ambulatory Visit (INDEPENDENT_AMBULATORY_CARE_PROVIDER_SITE_OTHER): Payer: BC Managed Care – PPO | Admitting: Psychology

## 2020-07-17 DIAGNOSIS — F411 Generalized anxiety disorder: Secondary | ICD-10-CM

## 2020-07-17 NOTE — Telephone Encounter (Signed)
mychart message sent to patient- re no visitor policy 

## 2020-07-21 ENCOUNTER — Encounter: Payer: Managed Care, Other (non HMO) | Admitting: Certified Nurse Midwife

## 2020-07-24 ENCOUNTER — Encounter: Payer: Managed Care, Other (non HMO) | Admitting: Certified Nurse Midwife

## 2020-07-27 NOTE — Telephone Encounter (Signed)
Please contact patient via MyChart. May bring child. Please reschedule postpartum visit. Thanks, JML

## 2020-07-31 ENCOUNTER — Ambulatory Visit (INDEPENDENT_AMBULATORY_CARE_PROVIDER_SITE_OTHER): Payer: BC Managed Care – PPO | Admitting: Psychology

## 2020-07-31 DIAGNOSIS — F411 Generalized anxiety disorder: Secondary | ICD-10-CM | POA: Diagnosis not present

## 2020-08-11 ENCOUNTER — Ambulatory Visit (INDEPENDENT_AMBULATORY_CARE_PROVIDER_SITE_OTHER): Payer: BC Managed Care – PPO | Admitting: Certified Nurse Midwife

## 2020-08-11 ENCOUNTER — Other Ambulatory Visit: Payer: Self-pay

## 2020-08-11 ENCOUNTER — Encounter: Payer: Self-pay | Admitting: Certified Nurse Midwife

## 2020-08-11 DIAGNOSIS — Z30019 Encounter for initial prescription of contraceptives, unspecified: Secondary | ICD-10-CM

## 2020-08-11 DIAGNOSIS — Z1331 Encounter for screening for depression: Secondary | ICD-10-CM

## 2020-08-11 LAB — POCT URINE PREGNANCY: Preg Test, Ur: NEGATIVE

## 2020-08-11 MED ORDER — SLYND 4 MG PO TABS: 4.0000 mg | ORAL_TABLET | ORAL | 11 refills | Status: AC

## 2020-08-11 NOTE — Progress Notes (Signed)
Subjective:    Tamara Mills is a 30 y.o. [id number] Caucasian female who presents for a postpartum visit. She is 9 weeks postpartum following a spontaneous vaginal delivery at [redacted]w[redacted]d gestational weeks. Anesthesia: epidural. I have fully reviewed the prenatal and intrapartum course.   Postpartum course has been uncomplicated   Baby's course has been uncomplicated. Baby is feeding by breast, reports difficulty switching to bottle feeding. Starting daycare in three (3) weeks.   Bleeding no bleeding. Bowel function is normal, except pain when having bowel movement. Bladder function is normal.   Patient is not sexually active. Contraception method is oral progesterone-only contraceptive.  Depression score is negative. PHQ-9. Score 2. GAD=12. Seeing counselor, declines medication at this time.   Reports normal blood pressures and stopped medication approximately five (5) weeks ago.   Denies difficulty breathing, respiratory distress, chest pain, nipple pain or soreness, vaginal bleeding, and leg swelling or pain.  Menstrual History   No LMP recorded. (Menstrual status: Lactating).  Last pap 05/26/2018  and was Neg.  The following portions of the patient's history were reviewed and updated as appropriate: allergies, current medications, past medical history, past surgical history and problem list.  Review of Systems   Pertinent items are noted in HPI.   Objective:   BP 128/81   Pulse 84   Ht 5\' 6"  (1.676 m)   Wt 67.1 kg   Breastfeeding Yes   BMI 23.89 kg/m   General:  alert, cooperative and no distress   Breasts:  deferred, no complaints  Lungs: clear to auscultation bilaterally  Heart:  regular rate and rhythm  Abdomen: soft, nontender   Vulva: normal  Vagina: normal vagina  Cervix:  closed  Corpus: Well-involuted  Adnexa:  Non-palpable  Rectal Exam: No hemorrhoids visualized   GAD 7 : Generalized Anxiety Score 08/11/2020 06/27/2020  Nervous, Anxious, on Edge 3 2   Control/stop worrying 2 1  Worry too much - different things 3 2  Trouble relaxing 0 0  Restless 0 0  Easily annoyed or irritable 2 0  Afraid - awful might happen 2 1  Total GAD 7 Score 12 6    Depression screen [facility] 2/9 08/11/2020 06/27/2020 08/23/2019 10/18/2017  Decreased Interest 0 0 0 0  Down, Depressed, Hopeless 0 0 0 0  PHQ - 2 Score 0 0 0 0  Altered sleeping 1 0 0 0  Tired, decreased energy 0 0 0 0  Change in appetite 0 0 0 0  Feeling bad or failure about yourself  1 0 0 0  Trouble concentrating 0 0 0 0  Moving slowly or fidgety/restless 0 0 0 0  Suicidal thoughts 0 0 0 0  PHQ-9 Score 2 0 0 0  Difficult doing work/chores - - Not difficult at all -    Assessment:   Postpartum care following vaginal delivery  Lactating Mother    Depression screening  Initial prescription of contraceptives   Plan:   Contraception: oral progesterone-only contraceptive Rx: Slynd  Reassurance provided.  Encouraged to take a stool softener, let 12/18/2017 know if no improvement.  RTC x 3-4 months for ANNUAL exam or sooner if needed.  [place], Student-MidWife [employer] 08/11/20 3:56 PM

## 2020-08-11 NOTE — Patient Instructions (Signed)
Preventive Care 21-31 Years Old, Female Preventive care refers to lifestyle choices and visits with your health care provider that can promote health and wellness. This includes:  A yearly physical exam. This is also called an annual wellness visit.  Regular dental and eye exams.  Immunizations.  Screening for certain conditions.  Healthy lifestyle choices, such as: ? Eating a healthy diet. ? Getting regular exercise. ? Not using drugs or products that contain nicotine and tobacco. ? Limiting alcohol use. What can I expect for my preventive care visit? Physical exam Your health care provider may check your:  Height and weight. These may be used to calculate your BMI (body mass index). BMI is a measurement that tells if you are at a healthy weight.  Heart rate and blood pressure.  Body temperature.  Skin for abnormal spots. Counseling Your health care provider may ask you questions about your:  Past medical problems.  Family's medical history.  Alcohol, tobacco, and drug use.  Emotional well-being.  Home life and relationship well-being.  Sexual activity.  Diet, exercise, and sleep habits.  Work and work environment.  Access to firearms.  Method of birth control.  Menstrual cycle.  Pregnancy history. What immunizations do I need? Vaccines are usually given at various ages, according to a schedule. Your health care provider will recommend vaccines for you based on your age, medical history, and lifestyle or other factors, such as travel or where you work.   What tests do I need? Blood tests  Lipid and cholesterol levels. These may be checked every 5 years starting at age 20.  Hepatitis C test.  Hepatitis B test. Screening  Diabetes screening. This is done by checking your blood sugar (glucose) after you have not eaten for a while (fasting).  STD (sexually transmitted disease) testing, if you are at risk.  BRCA-related cancer screening. This may be  done if you have a family history of breast, ovarian, tubal, or peritoneal cancers.  Pelvic exam and Pap test. This may be done every 3 years starting at age 21. Starting at age 30, this may be done every 5 years if you have a Pap test in combination with an HPV test. Talk with your health care provider about your test results, treatment options, and if necessary, the need for more tests.   Follow these instructions at home: Eating and drinking  Eat a healthy diet that includes fresh fruits and vegetables, whole grains, lean protein, and low-fat dairy products.  Take vitamin and mineral supplements as recommended by your health care provider.  Do not drink alcohol if: ? Your health care provider tells you not to drink. ? You are pregnant, may be pregnant, or are planning to become pregnant.  If you drink alcohol: ? Limit how much you have to 0-1 drink a day. ? Be aware of how much alcohol is in your drink. In the [place] one drink equals one 12 oz bottle of beer (355 mL), one 5 oz glass of wine (148 mL), or one 1 oz glass of hard liquor (44 mL).   Lifestyle  Take daily care of your teeth and gums. Brush your teeth every morning and night with fluoride toothpaste. Floss one time each day.  Stay active. Exercise for at least 30 minutes 5 or more days each week.  Do not use any products that contain nicotine or tobacco, such as cigarettes, e-cigarettes, and chewing tobacco. If you need help quitting, ask your health care provider.  Do not   use drugs.  If you are sexually active, practice safe sex. Use a condom or other form of protection to prevent STIs (sexually transmitted infections).  If you do not wish to become pregnant, use a form of birth control. If you plan to become pregnant, see your health care provider for a prepregnancy visit.  Find healthy ways to cope with stress, such as: ? Meditation, yoga, or listening to music. ? Journaling. ? Talking to a trusted  person. ? Spending time with friends and family. Safety  Always wear your seat belt while driving or riding in a vehicle.  Do not drive: ? If you have been drinking alcohol. Do not ride with someone who has been drinking. ? When you are tired or distracted. ? While texting.  Wear a helmet and other protective equipment during sports activities.  If you have firearms in your house, make sure you follow all gun safety procedures.  Seek help if you have been physically or sexually abused. What's next?  Go to your health care provider once a year for an annual wellness visit.  Ask your health care provider how often you should have your eyes and teeth checked.  Stay up to date on all vaccines. This information is not intended to replace advice given to you by your health care provider. Make sure you discuss any questions you have with your health care provider. Document Revised: 02/24/2020 Document Reviewed: 03/09/2018 Elsevier Patient Education  2021 Elsevier Inc.  

## 2020-08-11 NOTE — Progress Notes (Signed)
I have seen, interviewed, and examined the patient in conjunction with the [employer] and affirm the diagnosis and management plan.   Gunnar Bulla, CNM [facility], [facility] 08/11/20 4:52 PM

## 2020-08-11 NOTE — Progress Notes (Signed)
  PT is present today for her postpartum visit. Pt stated that she is breastfeeding and have not had sexually intercourse recently. Pt stated that she would like to get the OCP for birth control. UPT-NEG.  PHQ-9= 2  GAD-7= 12.  Pt stated that she is doing well no complaints.

## 2020-08-20 ENCOUNTER — Ambulatory Visit: Payer: BC Managed Care – PPO | Admitting: Psychology

## 2020-08-22 ENCOUNTER — Encounter: Payer: Self-pay | Admitting: Family Medicine

## 2020-09-03 ENCOUNTER — Encounter: Payer: Self-pay | Admitting: Family Medicine

## 2020-09-03 NOTE — Telephone Encounter (Signed)
Shapale, she is having edema and weight gain and elevated bp Would you please put her on the schedule for 12:30 on Friday (hopefully I can be here)  If she gets in with gyn first then we can see her next week   Please call her , thanks

## 2020-09-04 ENCOUNTER — Encounter: Payer: Self-pay | Admitting: Primary Care

## 2020-09-04 ENCOUNTER — Ambulatory Visit: Payer: BC Managed Care – PPO | Admitting: Primary Care

## 2020-09-04 ENCOUNTER — Other Ambulatory Visit: Payer: Self-pay

## 2020-09-04 VITALS — BP 136/82 | HR 76 | Temp 97.3°F | Ht 66.0 in | Wt 166.0 lb

## 2020-09-04 DIAGNOSIS — R635 Abnormal weight gain: Secondary | ICD-10-CM | POA: Insufficient documentation

## 2020-09-04 DIAGNOSIS — Z832 Family history of diseases of the blood and blood-forming organs and certain disorders involving the immune mechanism: Secondary | ICD-10-CM | POA: Insufficient documentation

## 2020-09-04 DIAGNOSIS — R601 Generalized edema: Secondary | ICD-10-CM | POA: Diagnosis not present

## 2020-09-04 DIAGNOSIS — Z3041 Encounter for surveillance of contraceptive pills: Secondary | ICD-10-CM

## 2020-09-04 DIAGNOSIS — I10 Essential (primary) hypertension: Secondary | ICD-10-CM

## 2020-09-04 LAB — CBC
HCT: 34.1 % — ABNORMAL LOW (ref 36.0–46.0)
Hemoglobin: 12.1 g/dL (ref 12.0–15.0)
MCHC: 35.5 g/dL (ref 30.0–36.0)
MCV: 84.8 fl (ref 78.0–100.0)
Platelets: 276 10*3/uL (ref 150.0–400.0)
RBC: 4.03 Mil/uL (ref 3.87–5.11)
RDW: 13.4 % (ref 11.5–15.5)
WBC: 7.4 10*3/uL (ref 4.0–10.5)

## 2020-09-04 LAB — C-REACTIVE PROTEIN: CRP: <1 mg/dL (ref 0.5–20.0)

## 2020-09-04 LAB — COMPREHENSIVE METABOLIC PANEL
ALT: 27 U/L (ref 0–35)
AST: 24 U/L (ref 0–37)
Albumin: 2.5 g/dL — ABNORMAL LOW (ref 3.5–5.2)
Alkaline Phosphatase: 87 U/L (ref 39–117)
BUN: 22 mg/dL (ref 6–23)
CO2: 25 mEq/L (ref 19–32)
Calcium: 7.6 mg/dL — ABNORMAL LOW (ref 8.4–10.5)
Chloride: 105 mEq/L (ref 96–112)
Creatinine, Ser: 0.68 mg/dL (ref 0.40–1.20)
GFR: 116.33 mL/min (ref >60.00)
Glucose, Bld: 84 mg/dL (ref 70–99)
Potassium: 4.4 mEq/L (ref 3.5–5.1)
Sodium: 135 mEq/L (ref 135–145)
Total Bilirubin: 0.6 mg/dL (ref 0.2–1.2)
Total Protein: 4.4 g/dL — ABNORMAL LOW (ref 6.0–8.3)

## 2020-09-04 LAB — SEDIMENTATION RATE: Sed Rate: 16 mm/hr (ref 0–20)

## 2020-09-04 LAB — T4, FREE: Free T4: 0.83 ng/dL (ref 0.60–1.60)

## 2020-09-04 LAB — CORTISOL: Cortisol, Plasma: 2.5 ug/dL

## 2020-09-04 LAB — TSH: TSH: 2.01 u[IU]/mL (ref 0.35–4.50)

## 2020-09-04 LAB — HEMOGLOBIN A1C: Hgb A1c MFr Bld: 5.1 % (ref 4.6–6.5)

## 2020-09-04 NOTE — Assessment & Plan Note (Signed)
Weight gain of 20+ pounds over the last month.  Differentials include hormonal cause, autoimmune cause, thyroid disorder, stress eating.  Low suspicion for renal disorder, congestive heart failure.  Continue daily weights, continue proper water hydration. Await results.

## 2020-09-04 NOTE — Progress Notes (Signed)
Subjective:    Patient ID: Tamara Mills, female    DOB: 17-Apr-1991, 30 y.o.   MRN: [id number]  HPI  This visit occurred during the SARS-CoV-2 public health emergency.  Safety protocols were in place, including screening questions prior to the visit, additional usage of staff PPE, and extensive cleaning of exam room while observing appropriate contact time as indicated for disinfecting solutions.   Tamara Mills is a 30 year old female patient of Dr. Milinda Antis with a history of hypertension, anxiety disorder, fatigue, gestational hypertension who presents today to discuss elevated blood pressure.  She is postpartum as of June 10, 2020, complicated by elevated blood pressure which required the use of oral metoprolol, prescription provided at discharge.  Unfortunately she experienced episodes of hypotension and bradycardia intermittently so metoprolol was discontinued in late December 2021.  Evaluated by her certified nurse midwife on 08/11/2020, initiated on oral progesterone for birth control.  She sent a message via MyChart yesterday reporting edema and weight gain, elevated blood pressure readings.  This was suspected to be secondary to her progesterone contraceptives so she was advised to contact her certified nurse midwife and follow-up today.  Today she endorses body wide bloating and feeling puffy to her face, upper extremities, abdomen, lower extremities that began about two weeks ago. She weighed herself a month ago and was 141 pounds, she weighed herself yesterday and she was 163.  Last night she noticed severe swelling to her bilateral lower extremities with pitting.  She checked her blood pressure which was 160/108 so she took one dose of metoprolol tartrate 50 mg last night with BP reduction to 140/90,  46.  She has not had any additional metoprolol today.  She stopped her drospirenone two days ago given her symptoms, she did notify her CNW who agreed. She denies any changes over the last  month, endorses stress eating which is chronic for her, history of binge eating disorder. She does not feel like she has eaten enough to gain 20+ pounds over a month  Family history of autoimmune disease in brother who had Crohn's.  Also other family history of 1 diabetes in maternal grandmother, Addison's disease in maternal aunt.  She does struggle with eczema to her hands, especially during winter months, has noticed a flare recently for which is active today, skin has cracked to dorsal fingers at DIP joints.  BP Readings from Last 3 Encounters:  09/04/20 136/82  08/11/20 128/81  06/13/20 133/80   Wt Readings from Last 3 Encounters:  09/04/20 166 lb (75.3 kg)  08/11/20 148 lb (67.1 kg)  06/13/20 159 lb 8 oz (72.3 kg)     Review of Systems  Constitutional: Negative for fever.  Respiratory: Negative for shortness of breath.   Cardiovascular: Negative for chest pain and palpitations.       Full body swelling/edema, pitting.  Gastrointestinal: Positive for nausea.       Abdominal bloating       Past Medical History:  Diagnosis Date  . Acne   . Amenorrhea    Hx of  . Anxiety   . COVID-19   . Eating disorder    ? eating disorder in past ( pt states just running too much)  . Hand eczema   . Hypertension   . Seasonal allergies   . Underweight    Hx of     Social History   Socioeconomic History  . Marital status: Married    Spouse name: Not on file  . Number  of children: Not on file  . Years of education: Not on file  . Highest education level: Not on file  Occupational History  . Occupation: Pharmacist  Tobacco Use  . Smoking status: Never Smoker  . Smokeless tobacco: Never Used  Vaping Use  . Vaping Use: Never used  Substance and Sexual Activity  . Alcohol use: Yes    Comment: occasional  . Drug use: No  . Sexual activity: Not Currently    Partners: Male  Other Topics Concern  . Not on file  Social History Narrative  . Not on file   Social Determinants  of Health   Financial Resource Strain: Not on file  Food Insecurity: Not on file  Transportation Needs: Not on file  Physical Activity: Not on file  Stress: Not on file  Social Connections: Not on file  Intimate Partner Violence: Not on file    Past Surgical History:  Procedure Laterality Date  . WISDOM TOOTH EXTRACTION      Family History  Problem Relation Age of Onset  . Crohn's disease Brother   . Ulcerative colitis Brother   . Sudden Cardiac Death Brother   . Hypertension Mother        had also preeclampsia  . Diabetes Maternal Grandmother        Type 1  . Cancer Paternal Grandmother 37       breast  . Breast cancer Paternal Grandmother   . Heart failure Paternal Grandfather        heart disease  . Cancer Paternal Aunt 40       breast  . Breast cancer Paternal Aunt   . Ovarian cancer Neg Hx   . Colon cancer Neg Hx   . Esophageal cancer Neg Hx   . Rectal cancer Neg Hx   . Stomach cancer Neg Hx     Allergies  Allergen Reactions  . Ortho Tri-Cyclen [Norgestimate-Eth Estradiol]     nausea  . Nickel Rash    Current Outpatient Medications on File Prior to Visit  Medication Sig Dispense Refill  . acetaminophen (TYLENOL) 325 MG tablet Take 2 tablets (650 mg total) by mouth every 4 (four) hours as needed for mild pain. 60 tablet 0  . Drospirenone (SLYND) 4 MG TABS Take by mouth.    . metoprolol tartrate (LOPRESSOR) 25 MG tablet TAKE 1 TABLET BY MOUTH TWICE A DAY (Patient taking differently: As needed) 180 tablet 1  . Prenatal Vit-Fe Fumarate-FA (PRENATAL MULTIVITAMIN) TABS tablet Take 1 tablet by mouth daily at 12 noon.     No current facility-administered medications on file prior to visit.    BP 136/82   Pulse 76   Temp (!) 97.3 F (36.3 C) (Temporal)   Ht 5\' 6"  (1.676 m)   Wt 166 lb (75.3 kg)   SpO2 97%   BMI 26.79 kg/m    Objective:   Physical Exam Constitutional:      Appearance: She is well-nourished.  Cardiovascular:     Rate and Rhythm:  Normal rate and regular rhythm.     Comments: Compression stockings in place to lower extremities, mild edema with trace pitting noted. Pulmonary:     Effort: Pulmonary effort is normal.     Breath sounds: Normal breath sounds.  Musculoskeletal:     Cervical back: Neck supple.  Skin:    General: Skin is warm and dry.     Comments: Eczema flare noted to bilateral dorsal hands, mostly involving fingers with cracked skin to  PIP joints.  Psychiatric:        Mood and Affect: Mood and affect and mood normal.            Assessment & Plan:

## 2020-09-04 NOTE — Assessment & Plan Note (Signed)
Suspect secondary to hormonal contraceptives, but will rule out other causes.  Checking labs including autoimmune, cortisol, thyroid function, BNP, CMP, CBC  Continue compression stockings. Continue to monitor weight. Remain off of progesterone contraceptives.  Await results.

## 2020-09-04 NOTE — Assessment & Plan Note (Signed)
Drospirenone pill discontinued.

## 2020-09-04 NOTE — Patient Instructions (Signed)
Stop by the lab prior to leaving today. I will notify you of your results once received.   Continue to monitor your weight and blood pressure.  Be sure you are drinking plenty of water.   It was a pleasure meeting you!

## 2020-09-04 NOTE — Assessment & Plan Note (Signed)
History of in brother, maternal grandmother, maternal aunt.  Unclear cause for sudden weight gain and generalized edema.  Checking autoimmune work-up including CCP, RF, ANA, sed rate, CRP.  Await results.

## 2020-09-04 NOTE — Telephone Encounter (Signed)
PCP has no other openings and she does want pt to be seen so appt scheduled with Mayra Reel, NP today. Will route to Jae Dire so she is aware what's going on with pt

## 2020-09-04 NOTE — Assessment & Plan Note (Signed)
Improved today, 1 dose of metoprolol tartrate 50 mg last night.  Agreed to refrain from additional doses.  Checking labs today, patient will continue to monitor her blood pressure.

## 2020-09-05 ENCOUNTER — Telehealth: Payer: Self-pay | Admitting: Radiology

## 2020-09-05 NOTE — Telephone Encounter (Signed)
Based on labs done do you feel she needs it?

## 2020-09-05 NOTE — Telephone Encounter (Signed)
No I do not. Tamara Mills, you mind taking a look at her lab results? She had a slightly elevated rheumatoid factor, negative CCP/sed rate/CRP.  Thoughts?

## 2020-09-05 NOTE — Telephone Encounter (Signed)
[facility] missed doing the BNP, the test would have to be a redraw if needed. Tamara Mills informed

## 2020-09-05 NOTE — Addendum Note (Signed)
Addended by: Alvina Chou on: 09/05/2020 10:59 AM   Modules accepted: Orders

## 2020-09-06 LAB — CYCLIC CITRUL PEPTIDE ANTIBODY, IGG: Cyclic Citrullin Peptide Ab: <16 UNITS

## 2020-09-06 LAB — RHEUMATOID FACTOR: Rheumatoid fact SerPl-aCnc: 31 IU/mL — ABNORMAL HIGH (ref <14)

## 2020-09-06 LAB — ANA: Anti Nuclear Antibody (ANA): NEGATIVE

## 2020-09-07 NOTE — Telephone Encounter (Signed)
I spoke to her. Discussed low albumin in the setting of pedal edema. Disc poss of nephrotic syndrome. I want to get urinalysis to looks for protein on Monday if possible

## 2020-09-08 ENCOUNTER — Other Ambulatory Visit: Payer: Self-pay | Admitting: Nephrology

## 2020-09-08 ENCOUNTER — Other Ambulatory Visit (HOSPITAL_COMMUNITY): Payer: Self-pay | Admitting: Nephrology

## 2020-09-08 ENCOUNTER — Other Ambulatory Visit: Payer: Self-pay

## 2020-09-08 ENCOUNTER — Encounter: Payer: Self-pay | Admitting: Family Medicine

## 2020-09-08 ENCOUNTER — Other Ambulatory Visit (INDEPENDENT_AMBULATORY_CARE_PROVIDER_SITE_OTHER): Payer: Self-pay | Admitting: Vascular Surgery

## 2020-09-08 ENCOUNTER — Other Ambulatory Visit (INDEPENDENT_AMBULATORY_CARE_PROVIDER_SITE_OTHER): Payer: Self-pay

## 2020-09-08 DIAGNOSIS — I10 Essential (primary) hypertension: Secondary | ICD-10-CM | POA: Diagnosis not present

## 2020-09-08 DIAGNOSIS — R601 Generalized edema: Secondary | ICD-10-CM | POA: Diagnosis not present

## 2020-09-08 DIAGNOSIS — R809 Proteinuria, unspecified: Secondary | ICD-10-CM | POA: Diagnosis not present

## 2020-09-08 DIAGNOSIS — N049 Nephrotic syndrome with unspecified morphologic changes: Secondary | ICD-10-CM

## 2020-09-08 HISTORY — DX: Nephrotic syndrome with unspecified morphologic changes: N04.9

## 2020-09-08 LAB — POC URINALSYSI DIPSTICK (AUTOMATED)
Bilirubin, UA: NEGATIVE
Glucose, UA: NEGATIVE
Ketones, UA: NEGATIVE
Leukocytes, UA: NEGATIVE
Nitrite, UA: NEGATIVE
Protein, UA: POSITIVE — AB
Spec Grav, UA: 1.025 (ref 1.010–1.025)
Urobilinogen, UA: 0.2 E.U./dL
pH, UA: 6 (ref 5.0–8.0)

## 2020-09-08 NOTE — Telephone Encounter (Signed)
Tamara Mills- I ordered a 24 hour urine protein for her and placed an urgent nephrology referral  Please call her  Will route to Anastasiya as well   Thanks

## 2020-09-08 NOTE — Telephone Encounter (Signed)
Pt will stop by to get kit for 24 hr protein and also she is aware of urgent referral done and will await a call from our [facility]

## 2020-09-08 NOTE — Telephone Encounter (Signed)
Pt has dropped of urine sample this morning. See lab results

## 2020-09-09 ENCOUNTER — Other Ambulatory Visit (INDEPENDENT_AMBULATORY_CARE_PROVIDER_SITE_OTHER): Payer: Self-pay | Admitting: Nephrology

## 2020-09-09 ENCOUNTER — Ambulatory Visit (INDEPENDENT_AMBULATORY_CARE_PROVIDER_SITE_OTHER): Payer: BC Managed Care – PPO | Admitting: Vascular Surgery

## 2020-09-09 ENCOUNTER — Other Ambulatory Visit: Payer: Self-pay

## 2020-09-09 ENCOUNTER — Encounter
Admission: RE | Admit: 2020-09-09 | Discharge: 2020-09-09 | Disposition: A | Discharge status: Home / Self Care | Payer: BC Managed Care – PPO | Source: Ambulatory Visit | Attending: Nephrology | Admitting: Nephrology

## 2020-09-09 ENCOUNTER — Ambulatory Visit (INDEPENDENT_AMBULATORY_CARE_PROVIDER_SITE_OTHER): Payer: BC Managed Care – PPO

## 2020-09-09 ENCOUNTER — Encounter (INDEPENDENT_AMBULATORY_CARE_PROVIDER_SITE_OTHER): Payer: Self-pay | Admitting: Vascular Surgery

## 2020-09-09 ENCOUNTER — Other Ambulatory Visit: Payer: BC Managed Care – PPO

## 2020-09-09 VITALS — BP 153/83 | HR 52 | Ht 65.0 in | Wt 171.0 lb

## 2020-09-09 DIAGNOSIS — R809 Proteinuria, unspecified: Secondary | ICD-10-CM

## 2020-09-09 DIAGNOSIS — I10 Essential (primary) hypertension: Secondary | ICD-10-CM

## 2020-09-09 DIAGNOSIS — Z01812 Encounter for preprocedural laboratory examination: Secondary | ICD-10-CM | POA: Diagnosis not present

## 2020-09-09 DIAGNOSIS — Z20822 Contact with and (suspected) exposure to covid-19: Secondary | ICD-10-CM | POA: Diagnosis not present

## 2020-09-09 LAB — RENAL FUNCTION PANEL
Albumin: 2 g/dL — ABNORMAL LOW (ref 3.5–5.0)
Anion gap: 8 (ref 5–15)
BUN: 16 mg/dL (ref 6–20)
CO2: 21 mmol/L — ABNORMAL LOW (ref 22–32)
Calcium: 7.8 mg/dL — ABNORMAL LOW (ref 8.9–10.3)
Chloride: 109 mmol/L (ref 98–111)
Creatinine, Ser: 0.43 mg/dL — ABNORMAL LOW (ref 0.44–1.00)
GFR, Estimated: >60 mL/min (ref >60)
Glucose, Bld: 88 mg/dL (ref 70–99)
Phosphorus: 4.5 mg/dL (ref 2.5–4.6)
Potassium: 3.9 mmol/L (ref 3.5–5.1)
Sodium: 138 mmol/L (ref 135–145)

## 2020-09-09 LAB — CBC WITH DIFFERENTIAL/PLATELET
Abs Immature Granulocytes: 0.02 10*3/uL (ref 0.00–0.07)
Basophils Absolute: 0 10*3/uL (ref 0.0–0.1)
Basophils Relative: 1 %
Eosinophils Absolute: 0.1 10*3/uL (ref 0.0–0.5)
Eosinophils Relative: 3 %
HCT: 37.2 % (ref 36.0–46.0)
Hemoglobin: 12.7 g/dL (ref 12.0–15.0)
Immature Granulocytes: 0 %
Lymphocytes Relative: 30 %
Lymphs Abs: 1.6 10*3/uL (ref 0.7–4.0)
MCH: 29.7 pg (ref 26.0–34.0)
MCHC: 34.1 g/dL (ref 30.0–36.0)
MCV: 86.9 fL (ref 80.0–100.0)
Monocytes Absolute: 0.5 10*3/uL (ref 0.1–1.0)
Monocytes Relative: 9 %
Neutro Abs: 3 10*3/uL (ref 1.7–7.7)
Neutrophils Relative %: 57 %
Platelets: 294 10*3/uL (ref 150–400)
RBC: 4.28 MIL/uL (ref 3.87–5.11)
RDW: 13.2 % (ref 11.5–15.5)
WBC: 5.2 10*3/uL (ref 4.0–10.5)
nRBC: 0 % (ref 0.0–0.2)

## 2020-09-09 LAB — URINALYSIS, ROUTINE W REFLEX MICROSCOPIC
Bacteria, UA: NONE SEEN
Bilirubin Urine: NEGATIVE
Glucose, UA: NEGATIVE mg/dL
Ketones, ur: NEGATIVE mg/dL
Leukocytes,Ua: NEGATIVE
Nitrite: NEGATIVE
Protein, ur: >=300 mg/dL — AB
Specific Gravity, Urine: 1.012 (ref 1.005–1.030)
pH: 6 (ref 5.0–8.0)

## 2020-09-09 LAB — PROTEIN / CREATININE RATIO, URINE
Creatinine, Urine: 61 mg/dL
Protein Creatinine Ratio: 9.92 mg/mg{Cre} — ABNORMAL HIGH (ref 0.00–0.15)
Total Protein, Urine: 605 mg/dL

## 2020-09-09 LAB — APTT: aPTT: 27 seconds (ref 24–36)

## 2020-09-09 LAB — PROTIME-INR
INR: 1 (ref 0.8–1.2)
Prothrombin Time: 13 seconds (ref 11.4–15.2)

## 2020-09-09 LAB — TYPE AND SCREEN
ABO/RH(D): O POS
Antibody Screen: NEGATIVE
Extend sample reason: UNDETERMINED

## 2020-09-09 NOTE — Assessment & Plan Note (Signed)
blood pressure control important in reducing the progression of atherosclerotic disease. On appropriate oral medications.  

## 2020-09-09 NOTE — Assessment & Plan Note (Signed)
The patient has proteinuria of unclear etiology at this point.  Given this finding, we performed a duplex today.  No evidence of renal artery stenosis was seen on either kidney.  Both kidneys were of a normal length.  Her iliac veins, IVC, and renal veins were all patent as well.  No obvious abnormalities on her vascular studies today.  I will defer further work-up to her nephrologist and primary care physician.  I will see her back as needed.

## 2020-09-09 NOTE — Progress Notes (Signed)
Patient ID: Tamara Mills, female   DOB: 04/27/1991, 30 y.o.   MRN: [id number]  Chief Complaint  Patient presents with  . Follow-up    Per Arilla Hice  urgent renal vein duplex to exclude renal vein thrombosis     HPI Tamara Mills is a 30 y.o. female.  I am asked to see the patient by Dr. George Ina for evaluation of proteinuria and the possibility of renal vein thrombosis.  This was a recent finding.  She has had some dark urine.  No trauma or injury.  No other clear causes or inciting events.  She does have high blood pressure but just takes Lopressor intermittently for this. We performed a duplex today.  No evidence of renal artery stenosis was seen on either kidney.  Both kidneys were of a normal length.  Her iliac veins, IVC, and renal veins were all patent as well.  No obvious abnormalities on her vascular studies today.     Past Medical History:  Diagnosis Date  . Acne   . Amenorrhea    Hx of  . Anxiety   . COVID-19   . Eating disorder    ? eating disorder in past ( pt states just running too much)  . Hand eczema   . Hypertension   . Seasonal allergies   . Underweight    Hx of    Past Surgical History:  Procedure Laterality Date  . WISDOM TOOTH EXTRACTION       Family History  Problem Relation Age of Onset  . Crohn's disease Brother   . Ulcerative colitis Brother   . Sudden Cardiac Death Brother   . Hypertension Mother        had also preeclampsia  . Diabetes Maternal Grandmother        Type 1  . Cancer Paternal Grandmother 64       breast  . Breast cancer Paternal Grandmother   . Heart failure Paternal Grandfather        heart disease  . Cancer Paternal Aunt 40       breast  . Breast cancer Paternal Aunt   . Ovarian cancer Neg Hx   . Colon cancer Neg Hx   . Esophageal cancer Neg Hx   . Rectal cancer Neg Hx   . Stomach cancer Neg Hx       Social History   Tobacco Use  . Smoking status: Never Smoker  . Smokeless tobacco: Never Used  Vaping Use  . Vaping  Use: Never used  Substance Use Topics  . Alcohol use: Yes    Comment: occasional  . Drug use: No     Allergies  Allergen Reactions  . Ortho Tri-Cyclen [Norgestimate-Eth Estradiol]     nausea  . Nickel Rash    Current Outpatient Medications  Medication Sig Dispense Refill  . acetaminophen (TYLENOL) 325 MG tablet Take 2 tablets (650 mg total) by mouth every 4 (four) hours as needed for mild pain. 60 tablet 0  . Drospirenone (SLYND) 4 MG TABS Take by mouth.    . metoprolol tartrate (LOPRESSOR) 25 MG tablet TAKE 1 TABLET BY MOUTH TWICE A DAY (Patient taking differently: As needed) 180 tablet 1  . Prenatal Vit-Fe Fumarate-FA (PRENATAL MULTIVITAMIN) TABS tablet Take 1 tablet by mouth daily at 12 noon.     No current facility-administered medications for this visit.      REVIEW OF SYSTEMS (Negative unless checked)  Constitutional: [] Weight loss  [] Fever  [] Chills  Cardiac: [] Chest pain   [] Chest pressure   [] Palpitations   [] Shortness of breath when laying flat   [] Shortness of breath at rest   [] Shortness of breath with exertion. Vascular:  [] Pain in legs with walking   [] Pain in legs at rest   [] Pain in legs when laying flat   [] Claudication   [] Pain in feet when walking  [] Pain in feet at rest  [] Pain in feet when laying flat   [] History of DVT   [] Phlebitis   [] Swelling in legs   [] Varicose veins   [] Non-healing ulcers Pulmonary:   [] Uses home oxygen   [] Productive cough   [] Hemoptysis   [] Wheeze  [] COPD   [] Asthma Neurologic:  [] Dizziness  [] Blackouts   [] Seizures   [] History of stroke   [] History of TIA  [] Aphasia   [] Temporary blindness   [] Dysphagia   [] Weakness or numbness in arms   [] Weakness or numbness in legs Musculoskeletal:  [] Arthritis   [] Joint swelling   [] Joint pain   [] Low back pain Hematologic:  [] Easy bruising  [] Easy bleeding   [] Hypercoagulable state   [] Anemic  [] Hepatitis Gastrointestinal:  [] Blood in stool   [] Vomiting blood  [] Gastroesophageal reflux/heartburn    [] Abdominal pain Genitourinary:  [] Chronic kidney disease   [] Difficult urination  [] Frequent urination  [] Burning with urination   [] Hematuria Skin:  [] Rashes   [] Ulcers   [] Wounds Psychological:  [x] History of anxiety   []  History of major depression.    Physical Exam BP (!) 153/83   Pulse (!) 52   Ht 5\' 5"  (1.651 m)   Wt 171 lb (77.6 kg)   BMI 28.46 kg/m  Gen:  WD/WN, NAD Head: /AT, No temporalis wasting.  Ear/Nose/Throat: Hearing grossly intact, nares w/o erythema or drainage, oropharynx w/o Erythema/Exudate Eyes: Conjunctiva clear, sclera non-icteric  Neck: trachea midline.  No JVD.  Pulmonary:  Good air movement, respirations not labored, no use of accessory muscles  Cardiac: RRR, no JVD Vascular:  Vessel Right Left  Radial Palpable Palpable                                   Gastrointestinal:. No masses, surgical incisions, or scars. Musculoskeletal: M/S 5/5 throughout.  Extremities without ischemic changes.  No deformity or atrophy.  Neurologic: Sensation grossly intact in extremities.  Symmetrical.  Speech is fluent. Motor exam as listed above. Psychiatric: Judgment intact, Mood & affect appropriate for pt's clinical situation. Dermatologic: No rashes or ulcers noted.  No cellulitis or open wounds.    Radiology No results found.  Labs Recent Results (from the past 2160 hour(s))  POCT urine pregnancy     Status: None   Collection Time: 08/11/20  2:02 PM  Result Value Ref Range   Preg Test, Ur Negative Negative  CBC     Status: Abnormal   Collection Time: 09/04/20  2:33 PM  Result Value Ref Range   WBC 7.4 4.0 - 10.5 K/uL   RBC 4.03 3.87 - 5.11 Mil/uL   Platelets 276.0 150.0 - 400.0 K/uL   Hemoglobin 12.1 12.0 - 15.0 g/dL   HCT (L) - %   MCV 84.8 78.0 - 100.0 fl   MCHC 35.5 30.0 - 36.0 g/dL   RDW  - %  Comprehensive metabolic panel     Status: Abnormal   Collection Time: 09/04/20  2:33 PM  Result Value Ref Range    Sodium 135  135 - 145 mEq/L   Potassium 4.4 3.5 - 5.1 mEq/L   Chloride 105 96 - 112 mEq/L   CO2 25 19 - 32 mEq/L   Glucose, Bld 84 70 - 99 mg/dL   BUN 22 6 - 23 mg/dL   Creatinine, Ser 1.610.68 0.40 - 1.20 mg/dL   Total Bilirubin 0.6 0.2 - 1.2 mg/dL   Alkaline Phosphatase 87 39 - 117 U/L   AST 24 0 - 37 U/L   ALT 27 0 - 35 U/L   Total Protein 4.4 (L) 6.0 - 8.3 g/dL   Albumin 2.5 (L) 3.5 - 5.2 g/dL   GFR 096.04116.33 >54.09>60.00 mL/min    Comment: Calculated using the CKD-EPI Creatinine Equation (2021)   Calcium 7.6 (L) 8.4 - 10.5 mg/dL  Hemoglobin W1XA1c     Status: None   Collection Time: 09/04/20  2:33 PM  Result Value Ref Range   Hgb A1c MFr Bld 5.1 4.6 - 6.5 %    Comment: Glycemic Control Guidelines for People with Diabetes:Non Diabetic:  <6%Goal of Therapy: <7%Additional Action Suggested:  >8%   TSH     Status: None   Collection Time: 09/04/20  2:33 PM  Result Value Ref Range   TSH 2.01 0.35 - 4.50 uIU/mL  T4, free     Status: None   Collection Time: 09/04/20  2:33 PM  Result Value Ref Range   Free T4 0.83 0.60 - 1.60 ng/dL    Comment: Specimens from patients who are undergoing biotin therapy and /or ingesting biotin supplements may contain high levels of biotin.  The higher biotin concentration in these specimens interferes with this Free T4 assay.  Specimens that contain high levels  of biotin may cause false high results for this Free T4 assay.  Please interpret results in light of the total clinical presentation of the patient.    ANA     Status: None   Collection Time: 09/04/20  2:33 PM  Result Value Ref Range   Anti Nuclear Antibody (ANA) NEGATIVE NEGATIVE    Comment: ANA IFA is a first line screen for detecting the presence of up to approximately 150 autoantibodies in various autoimmune diseases. A negative ANA IFA result suggests an ANA-associated autoimmune disease is not present at this time, but is not definitive. If there is high clinical suspicion for Sjogren's  syndrome, testing for anti-SS-A/Ro antibody should be considered. Anti-Jo-1 antibody should be considered for clinically suspected inflammatory myopathies. . AC-0: Negative . International Consensus on ANA Patterns (SeverTies.uyhttps://doi.org/10.1515/cclm-2018-0052) . For additional information, please refer to http://education.[facility].com/faq/FAQ177 (This link is being provided for informational/ educational purposes only.) .   C-reactive protein     Status: None   Collection Time: 09/04/20  2:33 PM  Result Value Ref Range   CRP <1.0 0.5 - 20.0 mg/dL  Cyclic citrul peptide antibody, IgG     Status: None   Collection Time: 09/04/20  2:33 PM  Result Value Ref Range   Cyclic Citrullin Peptide Ab <91<16 UNITS    Comment: Reference Range Negative:            <20 Weak Positive:       20-39 Moderate Positive:   40-59 Strong Positive:     >59 .   Rheumatoid factor     Status: Abnormal   Collection Time: 09/04/20  2:33 PM  Result Value Ref Range   Rhuematoid fact SerPl-aCnc 31 (H) <14 IU/mL  Sedimentation rate     Status: None   Collection Time:  09/04/20  2:33 PM  Result Value Ref Range   Sed Rate 16 0 - 20 mm/hr  Cortisol     Status: None   Collection Time: 09/04/20  2:33 PM  Result Value Ref Range   Cortisol, Plasma 2.5 ug/dL    Comment: AM:  4.3 - 22.4 ug/dLPM:  3.1 - 16.7 ug/dL  POCT Urinalysis Dipstick (Automated)     Status: Abnormal   Collection Time: 09/08/20  8:55 AM  Result Value Ref Range   Color, UA yellow    Clarity, UA dark    Glucose, UA Negative Negative   Bilirubin, UA neg    Ketones, UA neg    Spec Grav, UA 1.025 1.010 - 1.025   Blood, UA 1+    pH, UA 6.0 5.0 - 8.0   Protein, UA Positive (A) Negative    Comment: 3+   Urobilinogen, UA 0.2 0.2 or 1.0 E.U./dL   Nitrite, UA neg    Leukocytes, UA Negative Negative    Assessment/Plan:  Proteinuria The patient has proteinuria of unclear etiology at this point.  Given this finding, we performed a duplex  today.  No evidence of renal artery stenosis was seen on either kidney.  Both kidneys were of a normal length.  Her iliac veins, IVC, and renal veins were all patent as well.  No obvious abnormalities on her vascular studies today.  I will defer further work-up to her nephrologist and primary care physician.  I will see her back as needed.  Essential hypertension blood pressure control important in reducing the progression of atherosclerotic disease. On appropriate oral medications.       Festus Barren 09/09/2020, 11:07 AM   This note was created with Dragon medical transcription system.  Any errors from dictation are unintentional.

## 2020-09-10 ENCOUNTER — Encounter: Payer: Self-pay | Admitting: Family Medicine

## 2020-09-10 ENCOUNTER — Observation Stay
Admission: AD | Admit: 2020-09-10 | Discharge: 2020-09-11 | Disposition: A | Discharge status: Home / Self Care | Payer: BC Managed Care – PPO | Source: Ambulatory Visit | Attending: Nephrology | Admitting: Nephrology

## 2020-09-10 ENCOUNTER — Observation Stay: Payer: BC Managed Care – PPO

## 2020-09-10 ENCOUNTER — Other Ambulatory Visit: Payer: Self-pay

## 2020-09-10 DIAGNOSIS — N049 Nephrotic syndrome with unspecified morphologic changes: Secondary | ICD-10-CM | POA: Diagnosis not present

## 2020-09-10 DIAGNOSIS — R809 Proteinuria, unspecified: Principal | ICD-10-CM | POA: Insufficient documentation

## 2020-09-10 DIAGNOSIS — S37019A Minor contusion of unspecified kidney, initial encounter: Secondary | ICD-10-CM

## 2020-09-10 LAB — CBC
HCT: 36.9 % (ref 36.0–46.0)
Hemoglobin: 12.4 g/dL (ref 12.0–15.0)
MCH: 29.7 pg (ref 26.0–34.0)
MCHC: 33.6 g/dL (ref 30.0–36.0)
MCV: 88.5 fL (ref 80.0–100.0)
Platelets: 268 10*3/uL (ref 150–400)
RBC: 4.17 MIL/uL (ref 3.87–5.11)
RDW: 13.2 % (ref 11.5–15.5)
WBC: 6.8 10*3/uL (ref 4.0–10.5)
nRBC: 0 % (ref 0.0–0.2)

## 2020-09-10 LAB — SARS CORONAVIRUS 2 (TAT 6-24 HRS): SARS Coronavirus 2: NEGATIVE

## 2020-09-10 MED ORDER — DEXTROSE-NACL 5-0.45 % IV SOLN: INTRAVENOUS | Status: DC

## 2020-09-10 MED ORDER — HYDRALAZINE HCL 20 MG/ML IJ SOLN
10.0000 mg | Freq: Once | INTRAMUSCULAR | Status: AC
  Administered 2020-09-10: 10 mg via INTRAVENOUS
  Filled 2020-09-10: qty 1

## 2020-09-10 MED ORDER — LORAZEPAM 2 MG/ML IJ SOLN
1.0000 mg | INTRAMUSCULAR | Status: AC
  Administered 2020-09-10: 1 mg via INTRAVENOUS

## 2020-09-10 MED ORDER — ACETAMINOPHEN 325 MG PO TABS
650.0000 mg | ORAL_TABLET | Freq: Four times a day (QID) | ORAL | Status: DC | PRN
  Administered 2020-09-10 – 2020-09-11 (×3): 650 mg via ORAL
  Filled 2020-09-10 (×3): qty 2

## 2020-09-10 MED ORDER — LORAZEPAM 2 MG/ML IJ SOLN
INTRAMUSCULAR | Status: AC
  Filled 2020-09-10: qty 1

## 2020-09-10 NOTE — Progress Notes (Signed)
Pt ate lunch tray. Tolerated well. Drinking lots of fluids.  Pumping for infant. Waiting on infant arrival with FOB.   Some flank pain from procedure. Given Tylenol at 1614.   Ambulating well. Voided at 1630 ( ). No blood in urine.   Vital signs stable.

## 2020-09-10 NOTE — Progress Notes (Signed)
Patient back on floor. Vital signs stable. Patient will lay flat for 4 hours, until 4:00pm (will use bedpan if necessary).   Explained to patient nurse needs to check urine the 1st 3 voids, to ensure no blood in urine.   Assisted patient to use her breast pump.

## 2020-09-10 NOTE — Progress Notes (Addendum)
Patient taken to [place] via transporter. IV fluids running. Med already given. BP within limits.  Pt needs to sign consent form. Doctor has not explained procedure yet per patient.

## 2020-09-10 NOTE — Procedures (Signed)
After obtaining informed consent, the patient was brought down to the ultrasound suite. Subsequently the left kidney was identified under ultrasound. The left flank was prepped and draped in standard sterile fashion. Local anesthesia was achieved using 1% lidocaine. Subsequently using an 18-gauge biopsy device and ultraound guidance, a total of 4 passes were made into the left kidney. The specimens were submitted to pathology and deemed to be adequate for submission. No active bleeding noted on post-biopsy images.   The patient tolerated the procedure very well. She will return to her room for continued observation.   Estimated blood loss: Minimal.  Complications: Small perinephric hematoma without active bleeding.

## 2020-09-11 ENCOUNTER — Observation Stay: Payer: BC Managed Care – PPO

## 2020-09-11 ENCOUNTER — Encounter: Payer: Self-pay | Admitting: Family Medicine

## 2020-09-11 DIAGNOSIS — R809 Proteinuria, unspecified: Secondary | ICD-10-CM | POA: Diagnosis not present

## 2020-09-11 DIAGNOSIS — S37012A Minor contusion of left kidney, initial encounter: Secondary | ICD-10-CM | POA: Diagnosis not present

## 2020-09-11 LAB — CBC
HCT: 32.8 % — ABNORMAL LOW (ref 36.0–46.0)
HCT: 36.6 % (ref 36.0–46.0)
Hemoglobin: 10.9 g/dL — ABNORMAL LOW (ref 12.0–15.0)
Hemoglobin: 12.3 g/dL (ref 12.0–15.0)
MCH: 29.6 pg (ref 26.0–34.0)
MCH: 29.6 pg (ref 26.0–34.0)
MCHC: 33.2 g/dL (ref 30.0–36.0)
MCHC: 33.6 g/dL (ref 30.0–36.0)
MCV: 89.1 fL (ref 80.0–100.0)
MCV: 88 fL (ref 80.0–100.0)
Platelets: 237 10*3/uL (ref 150–400)
Platelets: 279 10*3/uL (ref 150–400)
RBC: 3.68 MIL/uL — ABNORMAL LOW (ref 3.87–5.11)
RBC: 4.16 MIL/uL (ref 3.87–5.11)
RDW: 13.2 % (ref 11.5–15.5)
RDW: 13.4 % (ref 11.5–15.5)
WBC: 5.4 10*3/uL (ref 4.0–10.5)
WBC: 5.6 10*3/uL (ref 4.0–10.5)
nRBC: 0 % (ref 0.0–0.2)
nRBC: 0 % (ref 0.0–0.2)

## 2020-09-11 MED ORDER — SENNOSIDES-DOCUSATE SODIUM 8.6-50 MG PO TABS: 2.0000 | ORAL_TABLET | Freq: Once | ORAL | 0 refills | Status: AC

## 2020-09-11 MED ORDER — DOCUSATE SODIUM 100 MG PO CAPS
100.0000 mg | ORAL_CAPSULE | Freq: Once | ORAL | Status: AC
  Administered 2020-09-11: 100 mg via ORAL
  Filled 2020-09-11: qty 1

## 2020-09-11 MED ORDER — PRENATAL MULTIVITAMIN CH
1.0000 | ORAL_TABLET | Freq: Every day | ORAL | Status: DC
  Administered 2020-09-11: 1 via ORAL
  Filled 2020-09-11: qty 1

## 2020-09-11 MED ORDER — SENNOSIDES-DOCUSATE SODIUM 8.6-50 MG PO TABS
2.0000 | ORAL_TABLET | Freq: Once | ORAL | Status: AC
  Administered 2020-09-11: 2 via ORAL
  Filled 2020-09-11: qty 2

## 2020-09-11 MED ORDER — SENNA 8.6 MG PO TABS
2.0000 | ORAL_TABLET | Freq: Once | ORAL | Status: AC
  Administered 2020-09-11: 17.2 mg via ORAL
  Filled 2020-09-11: qty 2

## 2020-09-11 MED ORDER — METOPROLOL TARTRATE 50 MG PO TABS
25.0000 mg | ORAL_TABLET | Freq: Once | ORAL | Status: AC
  Administered 2020-09-11: 25 mg via ORAL
  Filled 2020-09-11: qty 0.5

## 2020-09-11 NOTE — Discharge Summary (Signed)
Physician Discharge Summary  Patient ID: Tamara Mills MRN: [id number] DOB/AGE: 31/26/1992 31 y.o.  Admit date: 09/10/2020 Discharge date: 09/11/2020  Admission Diagnoses:  Discharge Diagnoses:  Active Problems:   Proteinuria   Discharged Condition: Stable  Hospital Course: Patient admitted to observation on 09/10/2020.  She was admitted for proteinuria and percutaneous ultrasound-guided left renal biopsy.  Patient tolerated renal biopsy quite well.  3 core specimens were obtained and submitted to [facility] nephro pathology for evaluation given underlying nephrotic syndrome.  Post biopsy there was a small perinephric hematoma without active bleeding.  This was reevaluated on postoperative day #1 and was found to be slightly bigger but again no active bleeding noted.  Patient was tolerating diet and had adequate pain control.  She was deemed to be stable for discharge on 09/11/2020.  Consults: None  Significant Diagnostic Studies: Percutaneous ultrasound-guided left renal biopsy  Treatments: None  Discharge Exam: Blood pressure (!) 145/85, pulse 67, temperature 98.2 F (36.8 C), resp. rate 18, SpO2 99 %, currently breastfeeding. Gen: No acute distress CV: S1S2 no rubs Abd:  Dressing intact, no bleeding, bowel sounds present Ext: 1+ bilateral LE edema.  Disposition:  To home.  No heavy lifting greater than 5 pounds for 2 weeks.  Patient advised to not take any NSAIDs given small perinephric hematoma.  Allergies as of 09/11/2020      Reactions   Ortho Tri-cyclen [norgestimate-eth Estradiol]    nausea   Nickel Rash      Medication List    STOP taking these medications   Slynd 4 MG Tabs Generic drug: Drospirenone     TAKE these medications   acetaminophen 325 MG tablet Commonly known as: Tylenol Take 2 tablets (650 mg total) by mouth every 4 (four) hours as needed for mild pain.   metoprolol tartrate 25 MG tablet Commonly known as: LOPRESSOR TAKE 1 TABLET BY MOUTH TWICE A DAY What  changed:   how much to take  how to take this  when to take this  additional instructions   prenatal multivitamin Tabs tablet Take 1 tablet by mouth daily at 12 noon.   senna-docusate 8.6-50 MG tablet Commonly known as: Senokot-S Take 2 tablets by mouth once for 1 dose.       Follow-up Information    Mady Haagensen, MD. Call in 3 week(s).   Specialty: Nephrology Contact information: [address] [place] [address] [phone]               Signed: Zakkery Dorian 09/11/2020, 1:09 PM

## 2020-09-11 NOTE — Progress Notes (Signed)
Patient rested comfortably throughout the night with FOB and baby at the bedside.  Pain tolerable and controlled with tylenol, vitals WNL.  No blood visible in urine.

## 2020-09-17 MED ORDER — ENALAPRIL MALEATE 5 MG PO TABS: 5.0000 mg | ORAL_TABLET | Freq: Every day | ORAL | 3 refills | Status: DC

## 2020-09-22 ENCOUNTER — Encounter: Payer: Self-pay | Admitting: Family Medicine

## 2020-09-23 ENCOUNTER — Encounter: Payer: Self-pay | Admitting: Nephrology

## 2020-09-23 DIAGNOSIS — N050 Unspecified nephritic syndrome with minor glomerular abnormality: Secondary | ICD-10-CM | POA: Diagnosis not present

## 2020-09-23 DIAGNOSIS — I10 Essential (primary) hypertension: Secondary | ICD-10-CM | POA: Diagnosis not present

## 2020-09-23 DIAGNOSIS — N049 Nephrotic syndrome with unspecified morphologic changes: Secondary | ICD-10-CM

## 2020-09-23 HISTORY — DX: Nephrotic syndrome with unspecified morphologic changes: N04.9

## 2020-09-23 LAB — SURGICAL PATHOLOGY

## 2020-09-25 ENCOUNTER — Ambulatory Visit (INDEPENDENT_AMBULATORY_CARE_PROVIDER_SITE_OTHER): Payer: BC Managed Care – PPO | Admitting: Psychology

## 2020-09-25 DIAGNOSIS — F411 Generalized anxiety disorder: Secondary | ICD-10-CM

## 2020-09-30 ENCOUNTER — Encounter: Payer: Self-pay | Admitting: Family Medicine

## 2020-10-01 NOTE — Telephone Encounter (Signed)
Message for son, not pt

## 2020-10-02 ENCOUNTER — Other Ambulatory Visit: Payer: Self-pay | Admitting: Family Medicine

## 2020-10-02 MED ORDER — VORTIOXETINE HBR 10 MG PO TABS: 10.0000 mg | ORAL_TABLET | Freq: Every day | ORAL | 5 refills | Status: DC

## 2020-10-02 NOTE — Addendum Note (Signed)
Addended by: Roxy Manns A on: 10/02/2020 10:08 AM   Modules accepted: Orders

## 2020-10-07 ENCOUNTER — Ambulatory Visit (INDEPENDENT_AMBULATORY_CARE_PROVIDER_SITE_OTHER): Payer: BC Managed Care – PPO | Admitting: Psychology

## 2020-10-07 DIAGNOSIS — F411 Generalized anxiety disorder: Secondary | ICD-10-CM

## 2020-10-09 DIAGNOSIS — N050 Unspecified nephritic syndrome with minor glomerular abnormality: Secondary | ICD-10-CM | POA: Diagnosis not present

## 2020-10-09 DIAGNOSIS — R601 Generalized edema: Secondary | ICD-10-CM | POA: Diagnosis not present

## 2020-10-09 DIAGNOSIS — I10 Essential (primary) hypertension: Secondary | ICD-10-CM | POA: Diagnosis not present

## 2020-10-09 DIAGNOSIS — R809 Proteinuria, unspecified: Secondary | ICD-10-CM | POA: Diagnosis not present

## 2020-10-28 ENCOUNTER — Encounter: Payer: Self-pay | Admitting: Family Medicine

## 2020-10-30 ENCOUNTER — Ambulatory Visit (INDEPENDENT_AMBULATORY_CARE_PROVIDER_SITE_OTHER): Payer: BC Managed Care – PPO | Admitting: Psychology

## 2020-10-30 DIAGNOSIS — F411 Generalized anxiety disorder: Secondary | ICD-10-CM | POA: Diagnosis not present

## 2020-11-04 DIAGNOSIS — R600 Localized edema: Secondary | ICD-10-CM | POA: Diagnosis not present

## 2020-11-04 DIAGNOSIS — R809 Proteinuria, unspecified: Secondary | ICD-10-CM | POA: Diagnosis not present

## 2020-11-04 DIAGNOSIS — I10 Essential (primary) hypertension: Secondary | ICD-10-CM | POA: Diagnosis not present

## 2020-11-04 DIAGNOSIS — N050 Unspecified nephritic syndrome with minor glomerular abnormality: Secondary | ICD-10-CM | POA: Diagnosis not present

## 2020-11-27 ENCOUNTER — Ambulatory Visit (INDEPENDENT_AMBULATORY_CARE_PROVIDER_SITE_OTHER): Payer: BC Managed Care – PPO | Admitting: Psychology

## 2020-11-27 DIAGNOSIS — F411 Generalized anxiety disorder: Secondary | ICD-10-CM | POA: Diagnosis not present

## 2020-12-09 DIAGNOSIS — R600 Localized edema: Secondary | ICD-10-CM | POA: Diagnosis not present

## 2020-12-09 DIAGNOSIS — N050 Unspecified nephritic syndrome with minor glomerular abnormality: Secondary | ICD-10-CM | POA: Diagnosis not present

## 2020-12-09 DIAGNOSIS — I10 Essential (primary) hypertension: Secondary | ICD-10-CM | POA: Diagnosis not present

## 2020-12-09 DIAGNOSIS — R809 Proteinuria, unspecified: Secondary | ICD-10-CM | POA: Diagnosis not present

## 2020-12-16 ENCOUNTER — Encounter: Payer: Self-pay | Admitting: Family Medicine

## 2020-12-25 ENCOUNTER — Ambulatory Visit (INDEPENDENT_AMBULATORY_CARE_PROVIDER_SITE_OTHER): Payer: BC Managed Care – PPO | Admitting: Certified Nurse Midwife

## 2020-12-25 ENCOUNTER — Encounter: Payer: Self-pay | Admitting: Certified Nurse Midwife

## 2020-12-25 ENCOUNTER — Other Ambulatory Visit (HOSPITAL_COMMUNITY)
Admission: RE | Admit: 2020-12-25 | Discharge: 2020-12-25 | Disposition: A | Discharge status: Home / Self Care | Payer: BC Managed Care – PPO | Source: Ambulatory Visit | Attending: Certified Nurse Midwife | Admitting: Certified Nurse Midwife

## 2020-12-25 ENCOUNTER — Ambulatory Visit (INDEPENDENT_AMBULATORY_CARE_PROVIDER_SITE_OTHER): Payer: BC Managed Care – PPO | Admitting: Psychology

## 2020-12-25 ENCOUNTER — Other Ambulatory Visit: Payer: Self-pay

## 2020-12-25 VITALS — BP 123/80 | HR 74 | Resp 16 | Ht 65.75 in | Wt 143.2 lb

## 2020-12-25 DIAGNOSIS — Z124 Encounter for screening for malignant neoplasm of cervix: Secondary | ICD-10-CM | POA: Diagnosis not present

## 2020-12-25 DIAGNOSIS — F411 Generalized anxiety disorder: Secondary | ICD-10-CM | POA: Diagnosis not present

## 2020-12-25 DIAGNOSIS — Z01419 Encounter for gynecological examination (general) (routine) without abnormal findings: Secondary | ICD-10-CM

## 2020-12-25 NOTE — Progress Notes (Signed)
ANNUAL PREVENTATIVE CARE GYN  ENCOUNTER NOTE  Subjective:       Tamara Mills is a 30 y.o. [id number] female here for a routine annual gynecologic exam.  Current complaints: 1.  Needs Pap smear  Denies difficulty breathing or respiratory distress, chest pain, abdominal pain, excessive vaginal bleeding, dysuria, and leg pain or swelling.     Gynecologic History  Patient's last menstrual period was 11/28/2020. Period Cycle (Days): 28 Period Duration (Days): 5 Period Pattern: Regular Menstrual Flow: Moderate Menstrual Control: Maxi pad Menstrual Control Change Freq (Hours): 2-3 Dysmenorrhea: None  Contraception: condoms and vasectomy  Last Pap: 05/2018. Results were: normal  Obstetric History  OB History  Gravida Para Term Preterm AB Living  2 2 2     2   SAB IAB Ectopic Multiple Live Births        0 2    # Outcome Date GA Lbr Len/2nd Weight Sex Delivery Anes PTL Lv  2 Term 06/09/20 [redacted]w[redacted]d / 00:14 7 lb 12.2 oz (3.52 kg) M Vag-Spont EPI  LIV  1 Term 09/10/17 [redacted]w[redacted]d / 04:02 7 lb 15 oz (3.6 kg) F Vag-Spont EPI  LIV    Past Medical History:  Diagnosis Date   Acne    Amenorrhea    Hx of   Anxiety    COVID-19    Eating disorder    ? eating disorder in past ( pt states just running too much)   Hand eczema    Hypertension    Nephrotic syndrome 09/23/2020   Seasonal allergies    Underweight    Hx of    Past Surgical History:  Procedure Laterality Date   WISDOM TOOTH EXTRACTION      Current Outpatient Medications on File Prior to Visit  Medication Sig Dispense Refill   acetaminophen (TYLENOL) 325 MG tablet Take 2 tablets (650 mg total) by mouth every 4 (four) hours as needed for mild pain. 60 tablet 0   enalapril (VASOTEC) 5 MG tablet Take 1 tablet (5 mg total) by mouth daily. 90 tablet 3   metoprolol tartrate (LOPRESSOR) 25 MG tablet TAKE 1 TABLET BY MOUTH TWICE A DAY (Patient taking differently: As needed) 180 tablet 1   Prenatal Vit-Fe Fumarate-FA (PRENATAL MULTIVITAMIN)  TABS tablet Take 1 tablet by mouth daily at 12 noon.     vortioxetine HBr (TRINTELLIX) 10 MG TABS tablet Take 1 tablet (10 mg total) by mouth daily. 30 tablet 5   No current facility-administered medications on file prior to visit.    Allergies  Allergen Reactions   Ortho Tri-Cyclen [Norgestimate-Eth Estradiol]     nausea   Nickel Rash    Social History   Socioeconomic History   Marital status: Married    Spouse name: Not on file   Number of children: Not on file   Years of education: Not on file   Highest education level: Not on file  Occupational History   Occupation: Pharmacist  Tobacco Use   Smoking status: Never   Smokeless tobacco: Never  Vaping Use   Vaping Use: Never used  Substance and Sexual Activity   Alcohol use: Yes    Comment: occasional   Drug use: No   Sexual activity: Not Currently    Partners: Male  Other Topics Concern   Not on file  Social History Narrative   Not on file   Social Determinants of Health   Financial Resource Strain: Not on file  Food Insecurity: Not on file  Transportation Needs: Not  on file  Physical Activity: Not on file  Stress: Not on file  Social Connections: Not on file  Intimate Partner Violence: Not on file    Family History  Problem Relation Age of Onset   Crohn's disease Brother    Ulcerative colitis Brother    Sudden Cardiac Death Brother    Hypertension Mother        had also preeclampsia   Diabetes Maternal Grandmother        Type 1   Cancer Paternal Grandmother 10       breast   Breast cancer Paternal Grandmother    Heart failure Paternal Grandfather        heart disease   Cancer Paternal Aunt 58       breast   Breast cancer Paternal Aunt    Ovarian cancer Neg Hx    Colon cancer Neg Hx    Esophageal cancer Neg Hx    Rectal cancer Neg Hx    Stomach cancer Neg Hx     The following portions of the patient's history were reviewed and updated as appropriate: allergies, current medications, past  family history, past medical history, past social history, past surgical history and problem list.  Review of Systems ROS negative except as noted above. Information obtained from patient.    Objective:   BP 123/80   Pulse 74   Resp 16   Ht 5' 5.75" (1.67 m)   Wt 143 lb 3.2 oz (65 kg)   LMP 11/28/2020   BMI 23.29 kg/m   CONSTITUTIONAL: Well-developed, well-nourished female in no acute distress.   PSYCHIATRIC: Normal mood and affect. Normal behavior. Normal judgment and thought content.  NEUROLGIC: Alert and oriented to person, place, and time. Normal muscle tone coordination. No cranial nerve deficit noted.  HENT:  Normocephalic, atraumatic.  EYES: Conjunctivae and EOM are normal.   NECK: Normal range of motion, supple, no masses.  Normal thyroid.   SKIN: Skin is warm and dry. No rash noted. Not diaphoretic. No erythema. No pallor.  CARDIOVASCULAR: Normal heart rate noted, regular rhythm, no murmur.  RESPIRATORY: Clear to auscultation bilaterally. Effort and breath sounds normal, no problems with respiration noted.  BREASTS: Symmetric in size. No masses, skin changes, nipple drainage, or lymphadenopathy. Lactating.   ABDOMEN: Soft, normal bowel sounds, no distention noted.  No tenderness, rebound or guarding.   PELVIC:  External Genitalia: Normal  Vagina: Normal  Cervix: Normal, Pap collected  Uterus: Normal  Adnexa: Normal  MUSCULOSKELETAL: Normal range of motion. No tenderness.  No cyanosis, clubbing, or edema.  2+ distal pulses.  LYMPHATIC: No Axillary, Supraclavicular, or Inguinal Adenopathy.  Assessment:   Annual gynecologic examination 30 y.o.  Contraception: condoms and vasectomy  Normal BMI  Problem List Items Addressed This Visit   None Visit Diagnoses     Well woman exam    -  Primary   Screening for cervical cancer           Plan:   Pap: Pap Co Test  Labs: Managed by PCP  Routine preventative health maintenance measures emphasized:  Exercise/Diet/Weight control, Tobacco Warnings, Alcohol/Substance use risks, and Stress Management; see AVS  Reviewed red flag symptoms and when to call  Return to Clinic - 1 Year for [facility] or sooner if needed   Serafina Royals, CNM  [facility], [facility] 12/25/20 10:16 AM

## 2020-12-25 NOTE — Patient Instructions (Signed)
Preventive Care 21-31 Years Old, Female Preventive care refers to lifestyle choices and visits with your health care provider that can promote health and wellness. This includes: A yearly physical exam. This is also called an annual wellness visit. Regular dental and eye exams. Immunizations. Screening for certain conditions. Healthy lifestyle choices, such as: Eating a healthy diet. Getting regular exercise. Not using drugs or products that contain nicotine and tobacco. Limiting alcohol use. What can I expect for my preventive care visit? Physical exam Your health care provider may check your: Height and weight. These may be used to calculate your BMI (body mass index). BMI is a measurement that tells if you are at a healthy weight. Heart rate and blood pressure. Body temperature. Skin for abnormal spots. Counseling Your health care provider may ask you questions about your: Past medical problems. Family's medical history. Alcohol, tobacco, and drug use. Emotional well-being. Home life and relationship well-being. Sexual activity. Diet, exercise, and sleep habits. Work and work environment. Access to firearms. Method of birth control. Menstrual cycle. Pregnancy history. What immunizations do I need?  Vaccines are usually given at various ages, according to a schedule. Your health care provider will recommend vaccines for you based on your age, medicalhistory, and lifestyle or other factors, such as travel or where you work. What tests do I need?  Blood tests Lipid and cholesterol levels. These may be checked every 5 years starting at age 20. Hepatitis C test. Hepatitis B test. Screening Diabetes screening. This is done by checking your blood sugar (glucose) after you have not eaten for a while (fasting). STD (sexually transmitted disease) testing, if you are at risk. BRCA-related cancer screening. This may be done if you have a family history of breast, ovarian, tubal, or  peritoneal cancers. Pelvic exam and Pap test. This may be done every 3 years starting at age 21. Starting at age 30, this may be done every 5 years if you have a Pap test in combination with an HPV test. Talk with your health care provider about your test results, treatment options,and if necessary, the need for more tests. Follow these instructions at home: Eating and drinking  Eat a healthy diet that includes fresh fruits and vegetables, whole grains, lean protein, and low-fat dairy products. Take vitamin and mineral supplements as recommended by your health care provider. Do not drink alcohol if: Your health care provider tells you not to drink. You are pregnant, may be pregnant, or are planning to become pregnant. If you drink alcohol: Limit how much you have to 0-1 drink a day. Be aware of how much alcohol is in your drink. In the [place] one drink equals one 12 oz bottle of beer (355 mL), one 5 oz glass of wine (148 mL), or one 1 oz glass of hard liquor (44 mL).  Lifestyle Take daily care of your teeth and gums. Brush your teeth every morning and night with fluoride toothpaste. Floss one time each day. Stay active. Exercise for at least 30 minutes 5 or more days each week. Do not use any products that contain nicotine or tobacco, such as cigarettes, e-cigarettes, and chewing tobacco. If you need help quitting, ask your health care provider. Do not use drugs. If you are sexually active, practice safe sex. Use a condom or other form of protection to prevent STIs (sexually transmitted infections). If you do not wish to become pregnant, use a form of birth control. If you plan to become pregnant, see your health care   provider for a prepregnancy visit. Find healthy ways to cope with stress, such as: Meditation, yoga, or listening to music. Journaling. Talking to a trusted person. Spending time with friends and family. Safety Always wear your seat belt while driving or riding in a  vehicle. Do not drive: If you have been drinking alcohol. Do not ride with someone who has been drinking. When you are tired or distracted. While texting. Wear a helmet and other protective equipment during sports activities. If you have firearms in your house, make sure you follow all gun safety procedures. Seek help if you have been physically or sexually abused. What's next? Go to your health care provider once a year for an annual wellness visit. Ask your health care provider how often you should have your eyes and teeth checked. Stay up to date on all vaccines. This information is not intended to replace advice given to you by your health care provider. Make sure you discuss any questions you have with your healthcare provider. Document Revised: 02/24/2020 Document Reviewed: 03/09/2018 Elsevier Patient Education  2022 [employer].

## 2020-12-26 LAB — CYTOLOGY - PAP
Comment: NEGATIVE
Diagnosis: NEGATIVE
High risk HPV: NEGATIVE

## 2021-01-15 ENCOUNTER — Ambulatory Visit: Payer: BC Managed Care – PPO | Admitting: Psychology

## 2021-01-22 ENCOUNTER — Ambulatory Visit (INDEPENDENT_AMBULATORY_CARE_PROVIDER_SITE_OTHER): Payer: BC Managed Care – PPO | Admitting: Psychology

## 2021-01-22 DIAGNOSIS — F411 Generalized anxiety disorder: Secondary | ICD-10-CM

## 2021-01-28 ENCOUNTER — Encounter: Payer: Self-pay | Admitting: Family Medicine

## 2021-01-28 NOTE — Telephone Encounter (Signed)
Called pt and she has both of her children and it would be hard for her to come today. She said for now she is stable and if she wakes up and feels better then she may not need med but if she wakes up tomorrow and feels worse she will come in, in the morning for stat labs, PCP aware

## 2021-02-12 DIAGNOSIS — I10 Essential (primary) hypertension: Secondary | ICD-10-CM | POA: Diagnosis not present

## 2021-02-12 DIAGNOSIS — N050 Unspecified nephritic syndrome with minor glomerular abnormality: Secondary | ICD-10-CM | POA: Diagnosis not present

## 2021-02-12 DIAGNOSIS — R809 Proteinuria, unspecified: Secondary | ICD-10-CM | POA: Diagnosis not present

## 2021-03-03 ENCOUNTER — Other Ambulatory Visit: Payer: Self-pay

## 2021-03-03 ENCOUNTER — Encounter: Payer: Self-pay | Admitting: Family Medicine

## 2021-03-03 ENCOUNTER — Ambulatory Visit: Payer: BC Managed Care – PPO | Admitting: Family Medicine

## 2021-03-03 VITALS — BP 132/86 | HR 61 | Temp 97.9°F | Ht 65.75 in | Wt 142.2 lb

## 2021-03-03 DIAGNOSIS — F411 Generalized anxiety disorder: Secondary | ICD-10-CM

## 2021-03-03 DIAGNOSIS — N049 Nephrotic syndrome with unspecified morphologic changes: Secondary | ICD-10-CM

## 2021-03-03 DIAGNOSIS — Z30011 Encounter for initial prescription of contraceptive pills: Secondary | ICD-10-CM | POA: Diagnosis not present

## 2021-03-03 DIAGNOSIS — I10 Essential (primary) hypertension: Secondary | ICD-10-CM

## 2021-03-03 DIAGNOSIS — Z309 Encounter for contraceptive management, unspecified: Secondary | ICD-10-CM | POA: Insufficient documentation

## 2021-03-03 MED ORDER — SLYND 4 MG PO TABS: ORAL_TABLET | ORAL | 11 refills | Status: DC

## 2021-03-03 MED ORDER — VORTIOXETINE HBR 10 MG PO TABS: 10.0000 mg | ORAL_TABLET | Freq: Every day | ORAL | 5 refills | Status: DC

## 2021-03-03 NOTE — Assessment & Plan Note (Signed)
Pt desires contraception while waiting for husband to get a vasectomy  Disc pros /cons for traditional OC vs progesterone only  She did well with drospirenone and would like to try it again (sent to pharmacy) This would be less likely to raise bp  Will monitor

## 2021-03-03 NOTE — Progress Notes (Signed)
Subjective:    Patient ID: Tamara Mills, female    DOB: 10-21-1990, 30 y.o.   MRN: [id number]  This visit occurred during the SARS-CoV-2 public health emergency.  Safety protocols were in place, including screening questions prior to the visit, additional usage of staff PPE, and extensive cleaning of exam room while observing appropriate contact time as indicated for disinfecting solutions.   HPI Pt presents for c/o anxiety and HTN  Wt Readings from Last 3 Encounters:  03/03/21 142 lb 3 oz (64.5 kg)  12/25/20 143 lb 3.2 oz (65 kg)  09/09/20 171 lb (77.6 kg)   23.12 kg/m  Trintellix -really wants to try -? 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  Still less free time  Has not been able to find the time for exercise   Has not been taking benicar since bp went too low  Was 90/60  She has 20 mg tabs takes as needed for elevated bp   For renal function-finally off prednisone  Now on cellcept for another month or so  Does not need to be on benicar for kidneys right now   BP Readings from Last 3 Encounters:  03/03/21 132/86  12/25/20 123/80  09/11/20 (!) 145/85   Pulse Readings from Last 3 Encounters:  03/03/21 61  12/25/20 74  09/11/20 67   Birth control  Is torn between combined or progesterone only  Periods are ok  No more pregnancies planned   Was on drosperinone 4 mg  It times with nephrotic syndrome -coindidental   Husband is planning a vasectomy   Patient Active Problem List   Diagnosis Date Noted   Contraception management 03/03/2021   Nephrotic syndrome 09/08/2020   Generalized edema 09/04/2020   Family history of autoimmune disorder 09/04/2020   History of gestational hypertension 11/30/2019   Oral contraceptive use  08/23/2019   Essential hypertension 11/04/2017   Family history of sudden cardiac death Jul 15, 2016   Acne vulgaris 04/28/2012   Anxiety disorder 02/28/2007   DEFORMITY, CAVUS, FOOT, ACQUIRED 02/23/2007   Past Medical History:  Diagnosis Date   Acne    Amenorrhea    Hx of   Anxiety    COVID-19    Eating disorder    ? eating disorder in past ( pt states just running too much)   Hand eczema    Hypertension    Nephrotic syndrome 09/23/2020   Seasonal allergies    Underweight    Hx of   Past Surgical History:  Procedure Laterality Date   WISDOM TOOTH EXTRACTION     Social History   Tobacco Use   Smoking status: Never   Smokeless tobacco: Never  Vaping Use   Vaping Use: Never used  Substance Use Topics   Alcohol use: Yes    Comment: occasional   Drug use: No   Family History  Problem Relation Age of Onset   Crohn's disease Brother    Ulcerative colitis Brother    Sudden Cardiac Death Brother    Hypertension Mother        had also preeclampsia   Diabetes Maternal Grandmother        Type 1   Cancer Paternal Grandmother 49  breast   Breast cancer Paternal Grandmother    Heart failure Paternal Grandfather        heart disease   Cancer Paternal Aunt 86       breast   Breast cancer Paternal Aunt    Ovarian cancer Neg Hx    Colon cancer Neg Hx    Esophageal cancer Neg Hx    Rectal cancer Neg Hx    Stomach cancer Neg Hx    Allergies  Allergen Reactions   Ortho Tri-Cyclen [Norgestimate-Eth Estradiol]     nausea   Nickel Rash   Current Outpatient Medications on File Prior to Visit  Medication Sig Dispense Refill   mycophenolate (CELLCEPT) 500 MG tablet Take 2 tablets by mouth 2 (two) times daily.     No current facility-administered medications on file prior to visit.    Review of Systems  Constitutional:  Positive for fatigue. Negative for activity change, appetite change, fever and unexpected weight change.  HENT:  Negative for congestion, ear pain,  rhinorrhea, sinus pressure and sore throat.   Eyes:  Negative for pain, redness and visual disturbance.  Respiratory:  Negative for cough, shortness of breath and wheezing.   Cardiovascular:  Negative for chest pain and palpitations.  Gastrointestinal:  Negative for abdominal pain, blood in stool, constipation and diarrhea.  Endocrine: Negative for polydipsia and polyuria.  Genitourinary:  Negative for dysuria, frequency and urgency.  Musculoskeletal:  Negative for arthralgias, back pain and myalgias.  Skin:  Negative for pallor and rash.  Allergic/Immunologic: Negative for environmental allergies.  Neurological:  Negative for dizziness, syncope and headaches.  Hematological:  Negative for adenopathy. Does not bruise/bleed easily.  Psychiatric/Behavioral:  Positive for sleep disturbance. Negative for decreased concentration and dysphoric mood. The patient is nervous/anxious.       Objective:   Physical Exam Constitutional:      General: She is not in acute distress.    Appearance: Normal appearance. She is well-developed and normal weight. She is not ill-appearing or diaphoretic.  HENT:     Head: Normocephalic and atraumatic.  Eyes:     Conjunctiva/sclera: Conjunctivae normal.     Pupils: Pupils are equal, round, and reactive to light.  Neck:     Thyroid: No thyromegaly.     Vascular: No carotid bruit or JVD.  Cardiovascular:     Rate and Rhythm: Normal rate and regular rhythm.     Heart sounds: Normal heart sounds.    No gallop.  Pulmonary:     Effort: Pulmonary effort is normal. No respiratory distress.     Breath sounds: Normal breath sounds. No wheezing or rales.  Abdominal:     General: Bowel sounds are normal. There is no distension or abdominal bruit.     Palpations: Abdomen is soft. There is no mass.     Tenderness: There is no abdominal tenderness.  Musculoskeletal:     Cervical back: Normal range of motion and neck supple.     Right lower leg: No edema.     Left  lower leg: No edema.  Lymphadenopathy:     Cervical: No cervical adenopathy.  Skin:    General: Skin is warm and dry.     Coloration: Skin is not pale.     Findings: No rash.  Neurological:     Mental Status: She is alert.     Coordination: Coordination normal.     Deep Tendon Reflexes: Reflexes are normal and symmetric. Reflexes normal.  Psychiatric:  Attention and Perception: Attention normal.        Mood and Affect: Mood is anxious.        Speech: Speech normal.        Behavior: Behavior normal.        Cognition and Memory: Cognition normal.     Comments: Pleasant and attentive  Speaks candidly about stressors and symptoms of anxiety           Assessment & Plan:   Problem List Items Addressed This Visit       Cardiovascular and Mediastinum   Essential hypertension    bp is notably improved Pt takes benicar (from nephrology) 20 mg only if needed for elevations Feeling good and has not had protein in urine          Genitourinary   Nephrotic syndrome    Under care of nephrology for suspected minimal change dz  Doing well- improved  Hopes to sign off soon  Has benicar for prn use          Other   Anxiety disorder - Primary    Struggling more recently and would like to try medication again (not breast feeding) viibyrd worked the best for her , unsure if covered  Would like to try trintellix if coupon covers most of the cost  Rev possible side effects (pt is a Teacher, 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)  Not a lot of time for self care but fairly good coping skills  Reviewed stressors Not in counseling currently  No SI or hopelessness  Px sent for trintellix to pharmacy to check on coverage  Has not done as well with classic ssris       Relevant Medications   vortioxetine HBr (TRINTELLIX) 10 MG TABS tablet   Contraception management    Pt desires contraception while waiting for husband to get a vasectomy  Disc pros /cons for traditional OC vs  progesterone only  She did well with drospirenone and would like to try it again (sent to pharmacy) This would be less likely to raise bp  Will monitor

## 2021-03-03 NOTE — Assessment & Plan Note (Signed)
Under care of nephrology for suspected minimal change dz  Doing well- improved  Hopes to sign off soon  Has benicar for prn use

## 2021-03-03 NOTE — Assessment & Plan Note (Signed)
bp is notably improved Pt takes benicar (from nephrology) 20 mg only if needed for elevations Feeling good and has not had protein in urine

## 2021-03-03 NOTE — Assessment & Plan Note (Signed)
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

## 2021-03-03 NOTE — Patient Instructions (Addendum)
Go forward with vasectomy  Start back on progesterone before that for cotraception   Take care of yourself  Let's see if trintellix is covered with coupon

## 2021-03-17 ENCOUNTER — Encounter: Payer: Self-pay | Admitting: Family Medicine

## 2021-03-27 ENCOUNTER — Ambulatory Visit (INDEPENDENT_AMBULATORY_CARE_PROVIDER_SITE_OTHER): Payer: BC Managed Care – PPO

## 2021-03-27 ENCOUNTER — Other Ambulatory Visit: Payer: Self-pay

## 2021-03-27 DIAGNOSIS — Z23 Encounter for immunization: Secondary | ICD-10-CM | POA: Diagnosis not present

## 2021-04-02 DIAGNOSIS — N050 Unspecified nephritic syndrome with minor glomerular abnormality: Secondary | ICD-10-CM | POA: Diagnosis not present

## 2021-04-02 DIAGNOSIS — I10 Essential (primary) hypertension: Secondary | ICD-10-CM | POA: Diagnosis not present

## 2021-04-02 DIAGNOSIS — R809 Proteinuria, unspecified: Secondary | ICD-10-CM | POA: Diagnosis not present

## 2021-05-21 ENCOUNTER — Encounter: Payer: Self-pay | Admitting: Family Medicine

## 2021-05-21 MED ORDER — VILAZODONE HCL 40 MG PO TABS: 40.0000 mg | ORAL_TABLET | Freq: Every day | ORAL | 3 refills | Status: DC

## 2021-09-08 ENCOUNTER — Encounter: Payer: Self-pay | Admitting: Family Medicine

## 2021-09-08 DIAGNOSIS — F902 Attention-deficit hyperactivity disorder, combined type: Secondary | ICD-10-CM | POA: Diagnosis not present

## 2021-09-08 DIAGNOSIS — F411 Generalized anxiety disorder: Secondary | ICD-10-CM | POA: Diagnosis not present

## 2021-09-28 DIAGNOSIS — F4010 Social phobia, unspecified: Secondary | ICD-10-CM | POA: Diagnosis not present

## 2021-09-28 DIAGNOSIS — F411 Generalized anxiety disorder: Secondary | ICD-10-CM | POA: Diagnosis not present

## 2021-10-13 DIAGNOSIS — F4322 Adjustment disorder with anxiety: Secondary | ICD-10-CM | POA: Diagnosis not present

## 2021-10-21 DIAGNOSIS — F4322 Adjustment disorder with anxiety: Secondary | ICD-10-CM | POA: Diagnosis not present

## 2021-11-04 DIAGNOSIS — F4322 Adjustment disorder with anxiety: Secondary | ICD-10-CM | POA: Diagnosis not present

## 2021-11-10 ENCOUNTER — Ambulatory Visit: Payer: BC Managed Care – PPO | Admitting: Family Medicine

## 2021-11-17 DIAGNOSIS — F4322 Adjustment disorder with anxiety: Secondary | ICD-10-CM | POA: Diagnosis not present

## 2021-11-18 ENCOUNTER — Ambulatory Visit (INDEPENDENT_AMBULATORY_CARE_PROVIDER_SITE_OTHER): Payer: BC Managed Care – PPO | Admitting: Family Medicine

## 2021-11-18 ENCOUNTER — Encounter: Payer: Self-pay | Admitting: Family Medicine

## 2021-11-18 VITALS — BP 120/78 | HR 61 | Ht 65.75 in | Wt 143.6 lb

## 2021-11-18 DIAGNOSIS — N049 Nephrotic syndrome with unspecified morphologic changes: Secondary | ICD-10-CM | POA: Diagnosis not present

## 2021-11-18 DIAGNOSIS — F411 Generalized anxiety disorder: Secondary | ICD-10-CM | POA: Diagnosis not present

## 2021-11-18 DIAGNOSIS — L659 Nonscarring hair loss, unspecified: Secondary | ICD-10-CM

## 2021-11-18 DIAGNOSIS — L639 Alopecia areata, unspecified: Secondary | ICD-10-CM | POA: Diagnosis not present

## 2021-11-18 LAB — CBC WITH DIFFERENTIAL/PLATELET
Basophils Absolute: 0 10*3/uL (ref 0.0–0.1)
Basophils Relative: 0.6 % (ref 0.0–3.0)
Eosinophils Absolute: 0.1 10*3/uL (ref 0.0–0.7)
Eosinophils Relative: 2 % (ref 0.0–5.0)
HCT: 40.7 % (ref 36.0–46.0)
Hemoglobin: 13.8 g/dL (ref 12.0–15.0)
Lymphocytes Relative: 37.9 % (ref 12.0–46.0)
Lymphs Abs: 2.5 10*3/uL (ref 0.7–4.0)
MCHC: 33.9 g/dL (ref 30.0–36.0)
MCV: 86.8 fl (ref 78.0–100.0)
Monocytes Absolute: 0.6 10*3/uL (ref 0.1–1.0)
Monocytes Relative: 8.9 % (ref 3.0–12.0)
Neutro Abs: 3.3 10*3/uL (ref 1.4–7.7)
Neutrophils Relative %: 50.6 % (ref 43.0–77.0)
Platelets: 263 10*3/uL (ref 150.0–400.0)
RBC: 4.69 Mil/uL (ref 3.87–5.11)
RDW: 13.4 % (ref 11.5–15.5)
WBC: 6.5 10*3/uL (ref 4.0–10.5)

## 2021-11-18 LAB — COMPREHENSIVE METABOLIC PANEL
ALT: 15 U/L (ref 0–35)
AST: 15 U/L (ref 0–37)
Albumin: 4.8 g/dL (ref 3.5–5.2)
Alkaline Phosphatase: 61 U/L (ref 39–117)
BUN: 17 mg/dL (ref 6–23)
CO2: 24 mEq/L (ref 19–32)
Calcium: 9.6 mg/dL (ref 8.4–10.5)
Chloride: 106 mEq/L (ref 96–112)
Creatinine, Ser: 0.67 mg/dL (ref 0.40–1.20)
GFR: 115.76 mL/min (ref >60.00)
Glucose, Bld: 88 mg/dL (ref 70–99)
Potassium: 4.4 mEq/L (ref 3.5–5.1)
Sodium: 138 mEq/L (ref 135–145)
Total Bilirubin: 1 mg/dL (ref 0.2–1.2)
Total Protein: 7.2 g/dL (ref 6.0–8.3)

## 2021-11-18 LAB — IRON: Iron: 90 ug/dL (ref 42–145)

## 2021-11-18 LAB — VITAMIN B12: Vitamin B-12: 380 pg/mL (ref 211–911)

## 2021-11-18 LAB — T4, FREE: Free T4: 0.79 ng/dL (ref 0.60–1.60)

## 2021-11-18 LAB — TSH: TSH: 1.34 u[IU]/mL (ref 0.35–5.50)

## 2021-11-18 MED ORDER — BETAMETHASONE DIPROPIONATE 0.05 % EX CREA: TOPICAL_CREAM | Freq: Two times a day (BID) | CUTANEOUS | 0 refills | Status: DC

## 2021-11-18 NOTE — Progress Notes (Signed)
? ?Subjective:  ? ? Patient ID: Tamara Mills, female    DOB: Jan 26, 1991, 31 y.o.   MRN: [id number] ? ?HPI ?Pt presents for hair loss and stress reaction /anxiety  ? ?Wt Readings from Last 3 Encounters:  ?11/18/21 143 lb 9.6 oz (65.1 kg)  ?03/03/21 142 lb 3 oz (64.5 kg)  ?12/25/20 143 lb 3.2 oz (65 kg)  ? ?23.35 kg/m? ? ? ?Noted hair loss last week  ?Top of her head   (alopecia spots)  ?Happened when she was a teen in response to stress ---had one in the past on the back of her head  ? ?Focal area now  ? ?Not loosing eye brows or lashes  ? ?Ties hair up  ?Very loose  ?No tugging on that area  ? ?Some flaking in scalp  ?Dandruff shampoo makes her worse instead of better  ? ? ? ?She tends to shed  ? ?Some family hx of hair loss  ? ?Anxiety  ?Medication is helping -thankful for that  ?Sees a therapist (helpful)  ?Saw an herbal medicine specialist and recommended kava , kwai calm  ?She has tried magnesium  ?Bee pollen  ?Vilazodone 40 mg daily  ? ?Tends to have a glass of wine at night  ? ?Exercise helps/she does not have a lot of time  ?Trying meditation  ? ?Is due for labs for renal  ? ?BP Readings from Last 3 Encounters:  ?11/18/21 120/78  ?03/03/21 132/86  ?12/25/20 123/80  ? ?Pulse Readings from Last 3 Encounters:  ?11/18/21 61  ?03/03/21 61  ?12/25/20 74  ? ? ? ?Taking progesterone only OC currently  ? ?Lab Results  ?Component Value Date  ? TSH 2.01 09/04/2020  ? ?Lab Results  ?Component Value Date  ? WBC 5.6 09/11/2020  ? HGB 12.3 09/11/2020  ? HCT 36.6 09/11/2020  ? MCV 88.0 09/11/2020  ? PLT 279 09/11/2020  ? ?No results found for: IRON, TIBC, FERRITIN ? ? ?Patient Active Problem List  ? Diagnosis Date Noted  ? Alopecia 11/18/2021  ? Contraception management 03/03/2021  ? Nephrotic syndrome 09/08/2020  ? Generalized edema 09/04/2020  ? Family history of autoimmune disorder 09/04/2020  ? History of gestational hypertension 11/30/2019  ? Oral contraceptive use 08/23/2019  ? Essential hypertension 11/04/2017  ? Family  history of sudden cardiac death 2016/07/21  ? Acne vulgaris 04/28/2012  ? Anxiety disorder 02/28/2007  ? DEFORMITY, CAVUS, FOOT, ACQUIRED 02/23/2007  ? ?Past Medical History:  ?Diagnosis Date  ? Acne   ? Amenorrhea   ? Hx of  ? Anxiety   ? COVID-19   ? Eating disorder   ? ? eating disorder in past ( pt states just running too much)  ? Hand eczema   ? Hypertension   ? Nephrotic syndrome 09/23/2020  ? Seasonal allergies   ? Underweight   ? Hx of  ? ?Past Surgical History:  ?Procedure Laterality Date  ? WISDOM TOOTH EXTRACTION    ? ?Social History  ? ?Tobacco Use  ? Smoking status: Never  ? Smokeless tobacco: Never  ?Vaping Use  ? Vaping Use: Never used  ?Substance Use Topics  ? Alcohol use: Yes  ?  Comment: occasional  ? Drug use: No  ? ?Family History  ?Problem Relation Age of Onset  ? Crohn's disease Brother   ? Ulcerative colitis Brother   ? Sudden Cardiac Death Brother   ? Hypertension Mother   ?     had also preeclampsia  ?  Diabetes Maternal Grandmother   ?     Type 1  ? Cancer Paternal Grandmother [id number]  ?     breast  ? Breast cancer Paternal Grandmother   ? Heart failure Paternal Grandfather   ?     heart disease  ? Cancer Paternal Aunt [id number]  ?     breast  ? Breast cancer Paternal Aunt   ? Ovarian cancer Neg Hx   ? Colon cancer Neg Hx   ? Esophageal cancer Neg Hx   ? Rectal cancer Neg Hx   ? Stomach cancer Neg Hx   ? ?Allergies  ?Allergen Reactions  ? Ortho Tri-Cyclen [Norgestimate-Eth Estradiol]   ?  nausea  ? Nickel Rash  ? ?Current Outpatient Medications on File Prior to Visit  ?Medication Sig Dispense Refill  ? Drospirenone (SLYND) 4 MG TABS Take po as directed 28 tablet 11  ? Vilazodone HCl (VIIBRYD) 40 MG TABS Take 1 tablet (40 mg total) by mouth daily. 90 tablet 3  ? ?No current facility-administered medications on file prior to visit.  ?  ?Review of Systems  ?Constitutional:  Negative for activity change, appetite change, fatigue, fever and unexpected weight change.  ?HENT:  Negative for congestion, ear  pain, rhinorrhea, sinus pressure and sore throat.   ?Eyes:  Negative for pain, redness and visual disturbance.  ?Respiratory:  Negative for cough, shortness of breath and wheezing.   ?Cardiovascular:  Negative for chest pain and palpitations.  ?Gastrointestinal:  Negative for abdominal pain, blood in stool, constipation and diarrhea.  ?Endocrine: Negative for polydipsia and polyuria.  ?Genitourinary:  Negative for dysuria, frequency and urgency.  ?Musculoskeletal:  Negative for arthralgias, back pain and myalgias.  ?Skin:  Negative for pallor and rash.  ?     Hair loss in one area   ?Allergic/Immunologic: Negative for environmental allergies.  ?Neurological:  Negative for dizziness, syncope and headaches.  ?Hematological:  Negative for adenopathy. Does not bruise/bleed easily.  ?Psychiatric/Behavioral:  Negative for decreased concentration and dysphoric mood. The patient is nervous/anxious.   ? ?   ?Objective:  ? Physical Exam ?Constitutional:   ?   General: She is not in acute distress. ?   Appearance: Normal appearance. She is not ill-appearing.  ?HENT:  ?   Head: Normocephalic and atraumatic.  ?Eyes:  ?   General: No scleral icterus. ?   Conjunctiva/sclera: Conjunctivae normal.  ?   Pupils: Pupils are equal, round, and reactive to light.  ?Cardiovascular:  ?   Rate and Rhythm: Normal rate and regular rhythm.  ?   Heart sounds: Normal heart sounds.  ?Pulmonary:  ?   Effort: No respiratory distress.  ?Musculoskeletal:  ?   Cervical back: Neck supple.  ?Lymphadenopathy:  ?   Cervical: No cervical adenopathy.  ?Skin: ?   General: Skin is warm and dry.  ?   Coloration: Skin is not jaundiced or pale.  ?   Findings: No bruising, erythema or rash.  ?   Comments: 2-3 cm round area of focal hair loss at mid crown (in part of hair) ?Skin is shiny and with follicles  ?Some short broken hairs are noted ? ?No erythema or scale ? ?No nail changes   ?Neurological:  ?   Mental Status: She is alert.  ?   Cranial Nerves: No cranial  nerve deficit.  ?   Motor: No weakness.  ?Psychiatric:     ?   Attention and Perception: Attention normal.     ?  Mood and Affect: Mood normal. Mood is not anxious or depressed. Affect is not blunt.     ?   Speech: Speech normal.     ?   Behavior: Behavior normal.     ?   Cognition and Memory: Cognition and memory normal.  ?   Comments: Pleasant ?Candidly discusses symptoms and stressors  ? ?  ? ? ? ? ? ?   ?Assessment & Plan:  ? ?Problem List Items Addressed This Visit   ? ?  ? Musculoskeletal and Integument  ? Alopecia areata - Primary  ?  Area of focal alopecia at crown/in part about 2-3 cm with nl appearing follicles  ?Per pt this happened as a teen in the back of her head during stress and eventually resolved  ? ?Reassuring exam ?Labs ordered  ?Adv to avoid traction in that area  ?Dermatology ref done  ? ? ? ?  ?  ?  ? Genitourinary  ? Nephrotic syndrome  ?  Doing well clinically  ?Nl bp  ?No edema  ? ?Lab ordered  ? ?  ?  ? Relevant Orders  ? Comprehensive metabolic panel  ?  ? Other  ? Anxiety disorder  ?  vilazdone has been helpful and well tolerated at 40 mg daily  ?Little time for self care ?Reviewed stressors/ coping techniques/symptoms/ support sources/ tx options and side effects in detail today  ?Enc to continue counseling ?Consider meditation practice and yoga when she can fit it in  ? ?  ?  ? ? ? ?

## 2021-11-18 NOTE — Assessment & Plan Note (Signed)
vilazdone has been helpful and well tolerated at 40 mg daily  ?Little time for self care ?Reviewed stressors/ coping techniques/symptoms/ support sources/ tx options and side effects in detail today  ?Enc to continue counseling ?Consider meditation practice and yoga when she can fit it in  ?

## 2021-11-18 NOTE — Assessment & Plan Note (Signed)
Doing well clinically  ?Nl bp  ?No edema  ? ?Lab ordered  ?

## 2021-11-18 NOTE — Addendum Note (Signed)
Addended by: Loura Pardon A on: 11/18/2021 05:06 PM ? ? Modules accepted: Orders ? ?

## 2021-11-18 NOTE — Patient Instructions (Signed)
I placed a derm referral  ?If you don't hear in 1-2 weeks please call us  ? ?Try T gel shampoo for flaking  ? ?Lab today  ? ?Continue current medicines  ? ?Take care of yourself  ? ?Try some yoga ?Keep working re: meditation  ? ? ?

## 2021-11-18 NOTE — Assessment & Plan Note (Addendum)
Area of focal alopecia at crown/in part about 2-3 cm with nl appearing follicles  ?Per pt this happened as a teen in the back of her head during stress and eventually resolved  ? ?Reassuring exam ?Labs ordered  ?Adv to avoid traction in that area  ?Dermatology ref done  ? ? ?

## 2021-12-03 ENCOUNTER — Encounter: Payer: Self-pay | Admitting: *Deleted

## 2021-12-19 IMAGING — US US BIOPSY
1 series · 14 of 22 positions shown · non-contrast
Comparison: none

INDICATION: Proteinuria

[Series 1: us biopsy · 0.19mm/px · 14 of 22 slices shown]
[im 1/22]
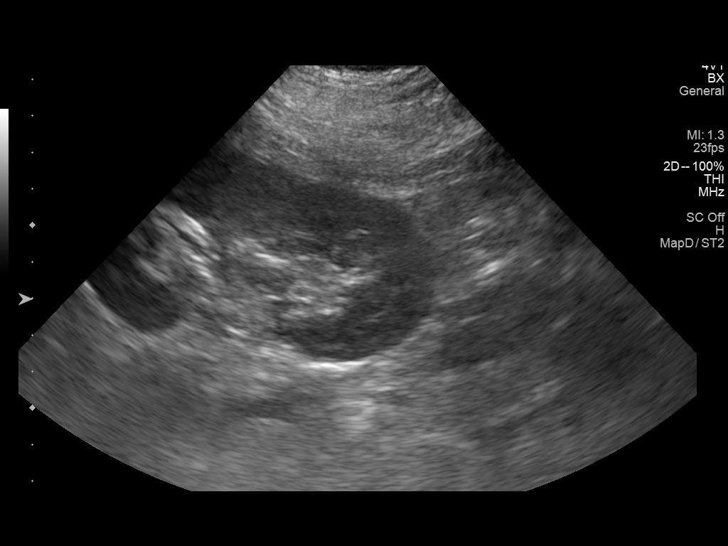
[im 3/22]
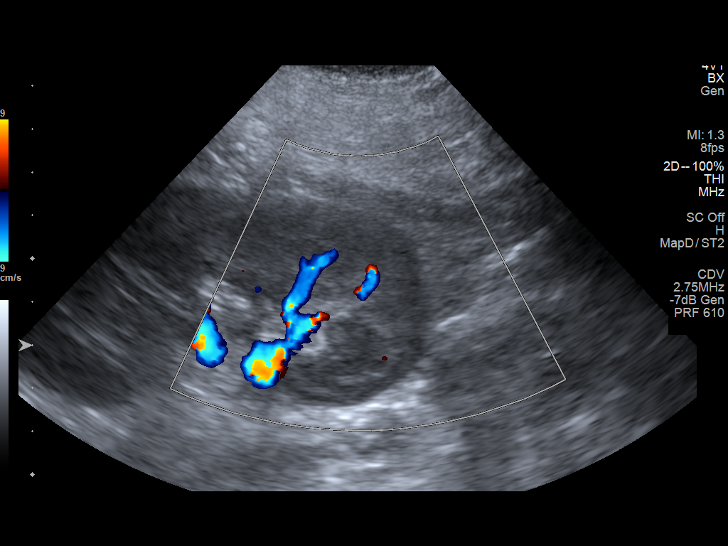
[im 4/22]
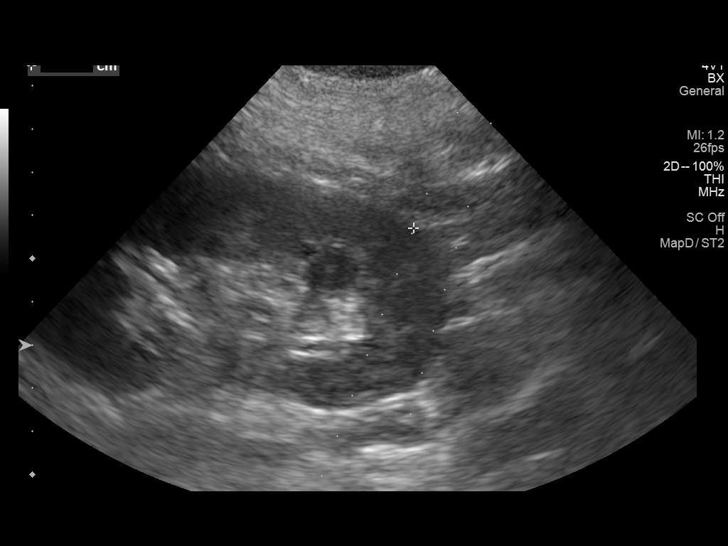
[im 6/22]
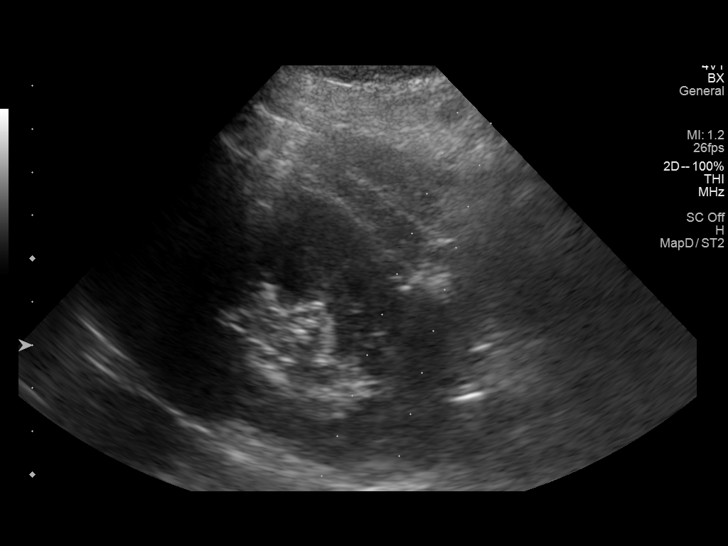
[im 8/22]
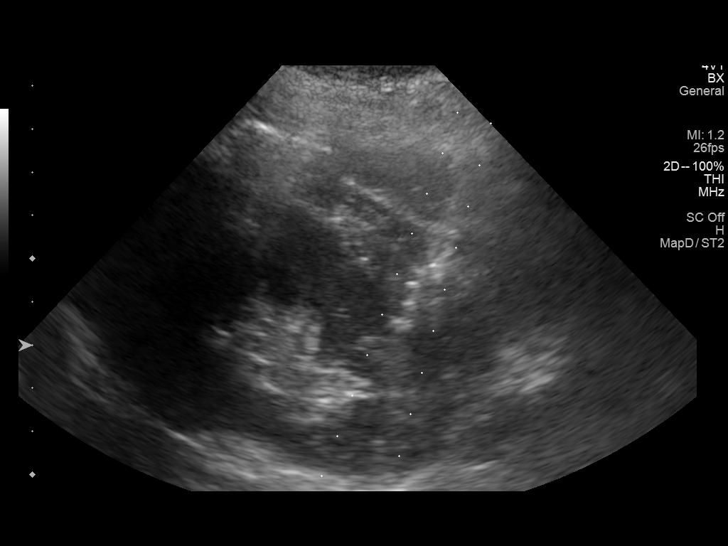
[im 9/22]
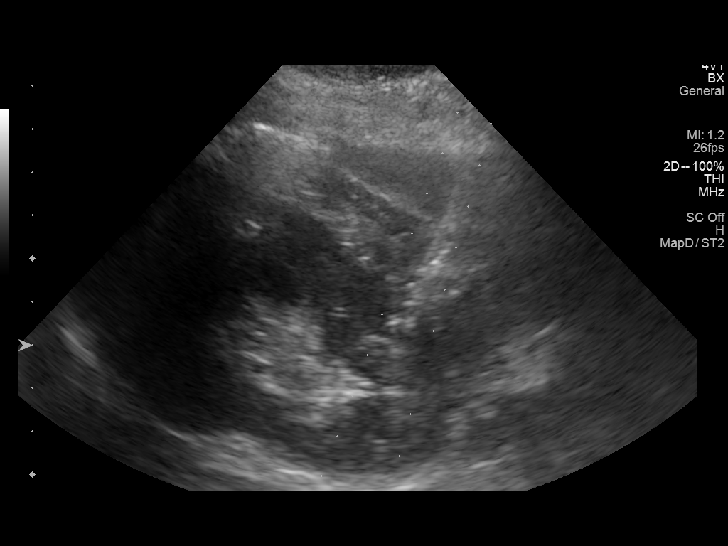
[im 11/22]
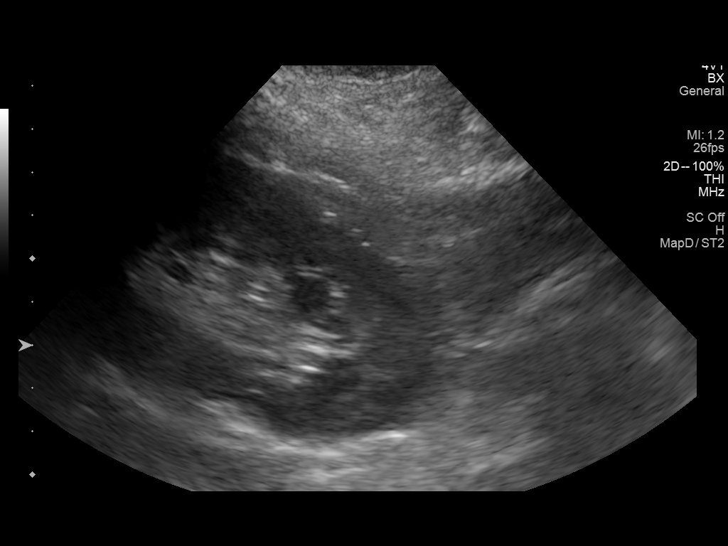
[im 12/22]
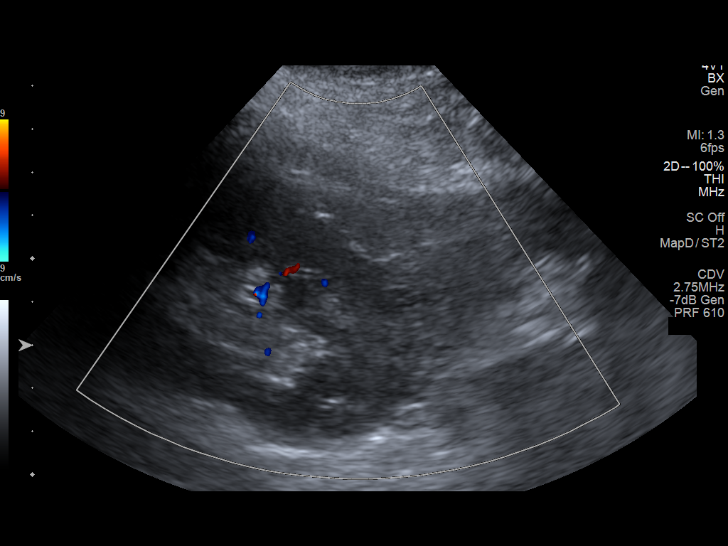
[im 14/22]
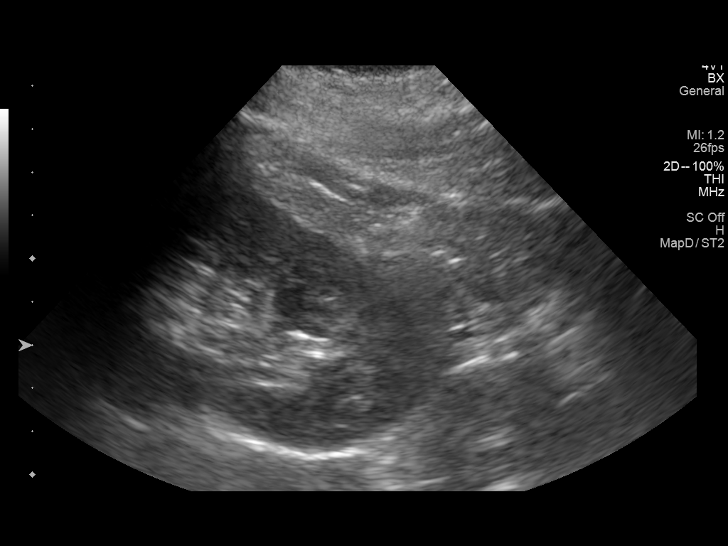
[im 15/22]
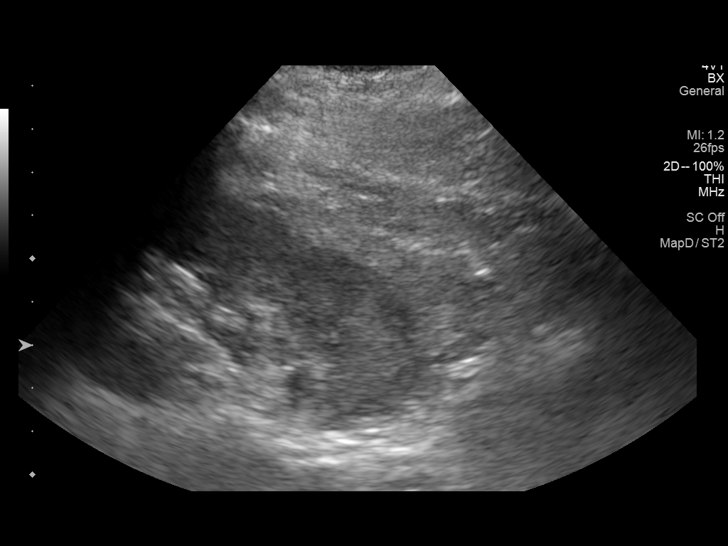
[im 17/22]
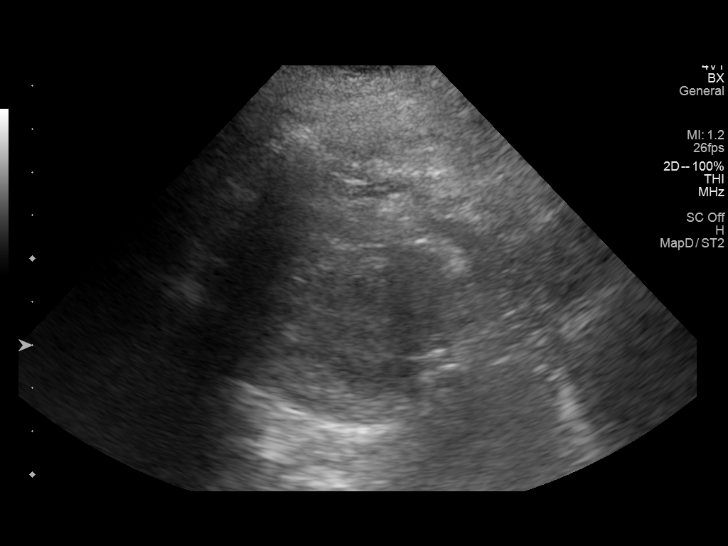
[im 19/22]
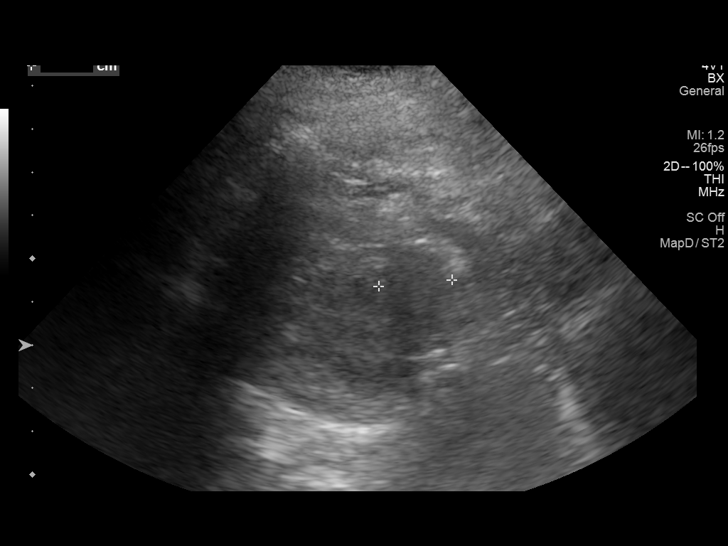
[im 20/22]
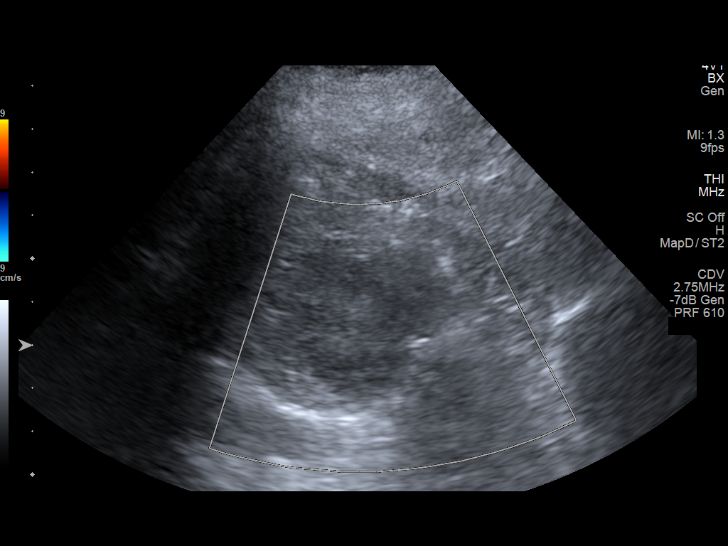
[im 22/22]
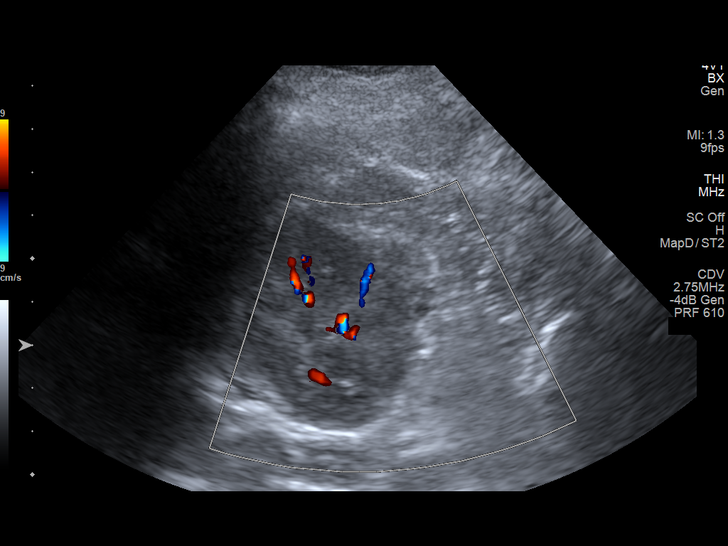

[14 of 22 positions shown; findings below may reference images not displayed]

EXAM:
ULTRASOUND GUIDED LEFT RENAL BIOPSY

MEDICATIONS:
None.

ANESTHESIA/SEDATION:
Moderate (conscious) sedation was employed during this procedure. A
total of Versed 0 mg and Fentanyl 0 mcg was administered
intravenously.

Moderate Sedation Time: 0 minutes. The patient's level of
consciousness and vital signs were monitored continuously by
radiology nursing throughout the procedure under my direct
supervision.

COMPLICATIONS:
None immediate.

PROCEDURE:
Random left renal biopsy performed without a radiologist present.
Multiple passes through the lower pole were performed by Dr. Moatshe.
Postprocedural images demonstrate a small perinephric hematoma
measuring 1.7 cm in thickness.
IMPRESSION: Random left renal biopsy performed without a radiologist present,
performed by Dr. Moatshe.

## 2021-12-20 IMAGING — US US RENAL
1 series · 14 of 25 positions shown · non-contrast
Comparison: 09/10/2020

CLINICAL DATA: Left perinephric hematoma

EXAM:
RENAL / URINARY TRACT ULTRASOUND COMPLETE

[Series 1: us renal · 41 acquisitions, 14 frames shown]
[im 1/41]
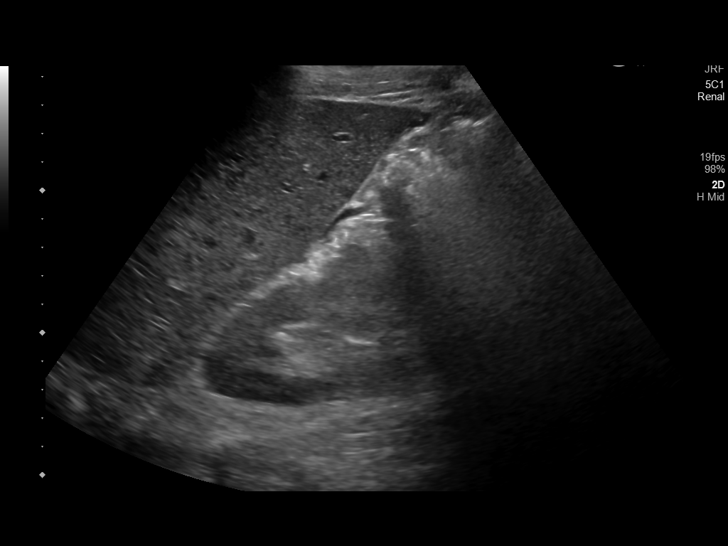
[im 4/41]
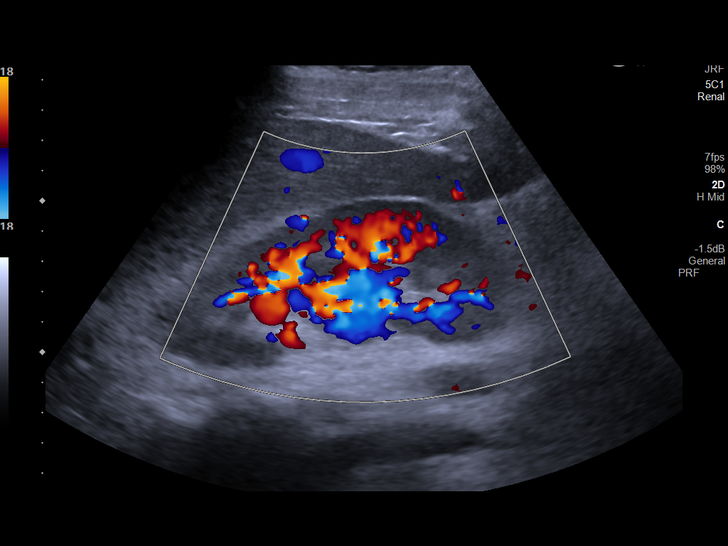
[im 7/41]
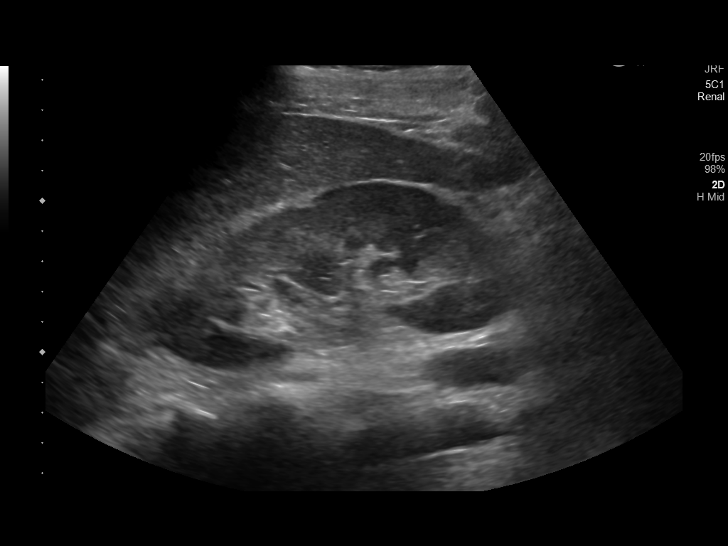
[im 11/41]
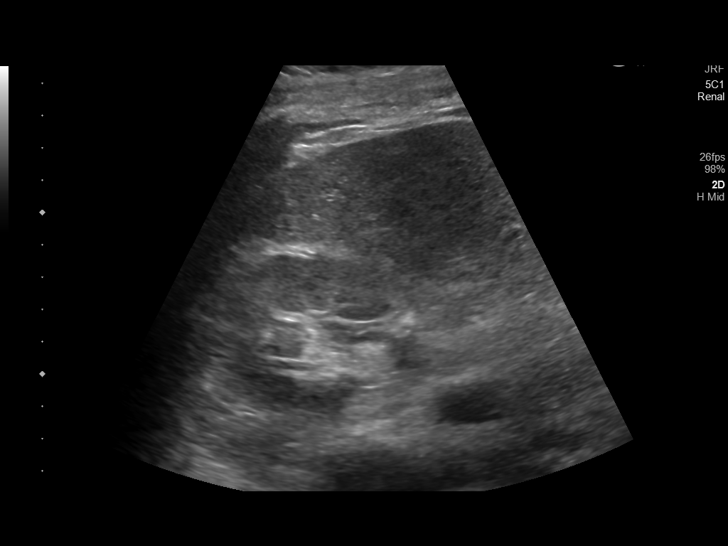
[im 14/41]
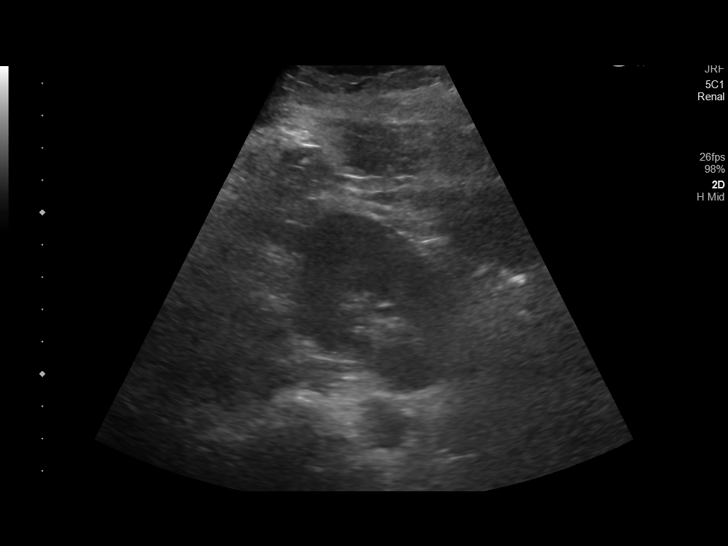
[im 16/41]
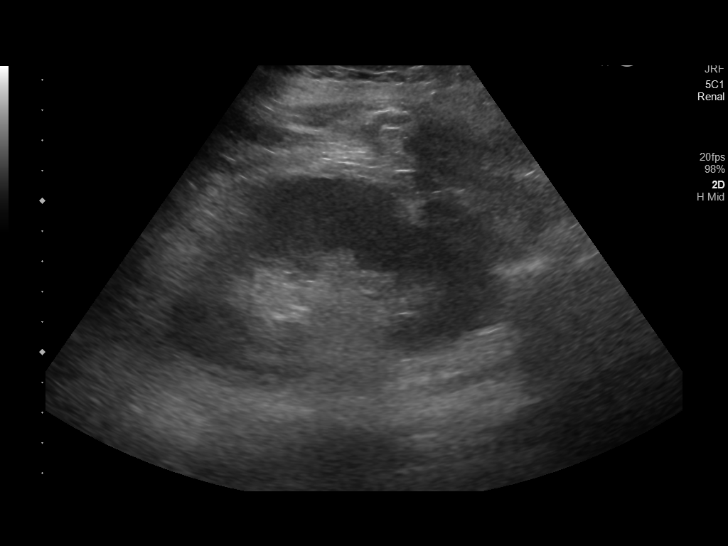
[im 19/41]
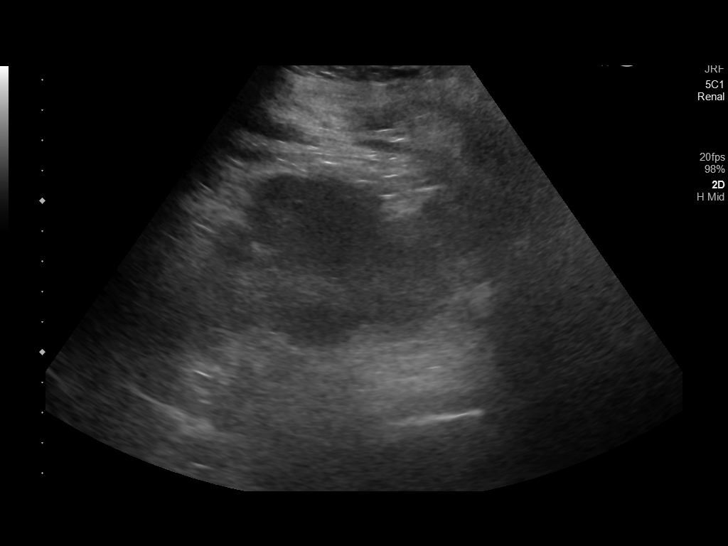
[im 22/41]
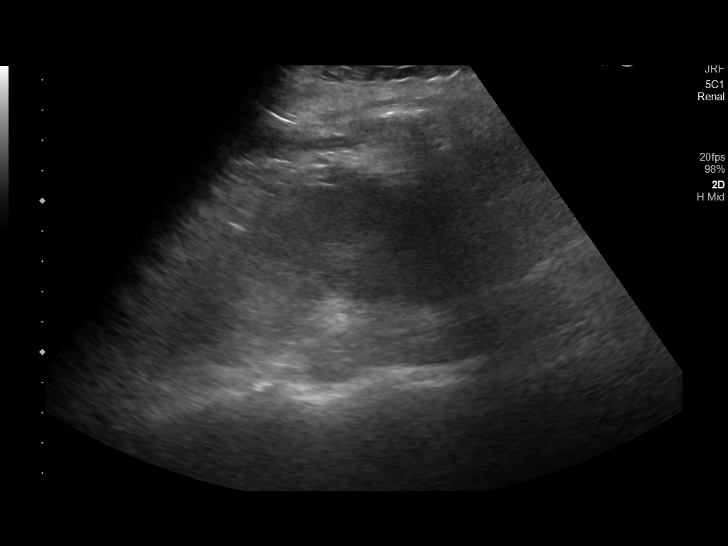
[im 26/41]
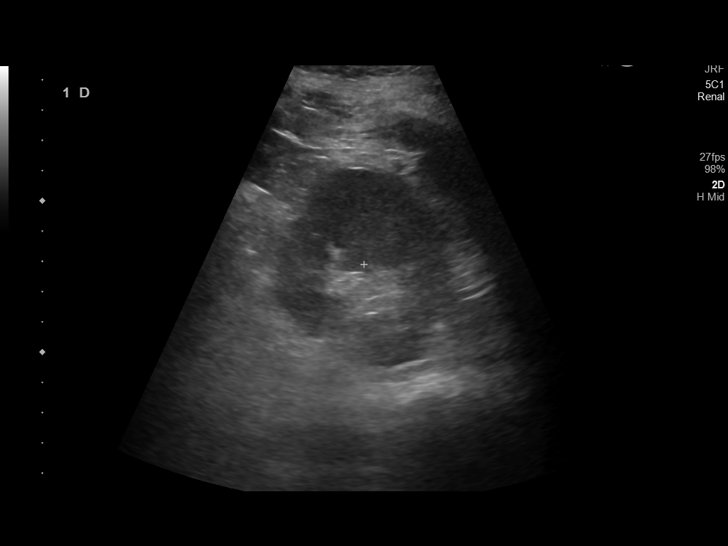
[im 27/41]
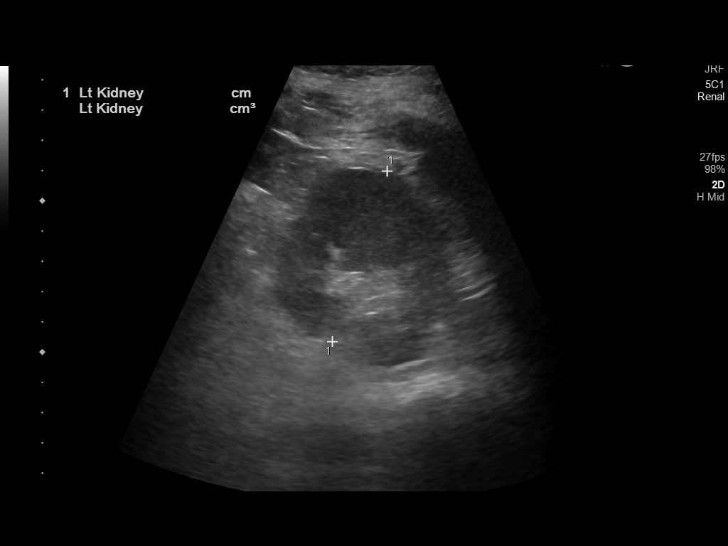
[im 31/41]
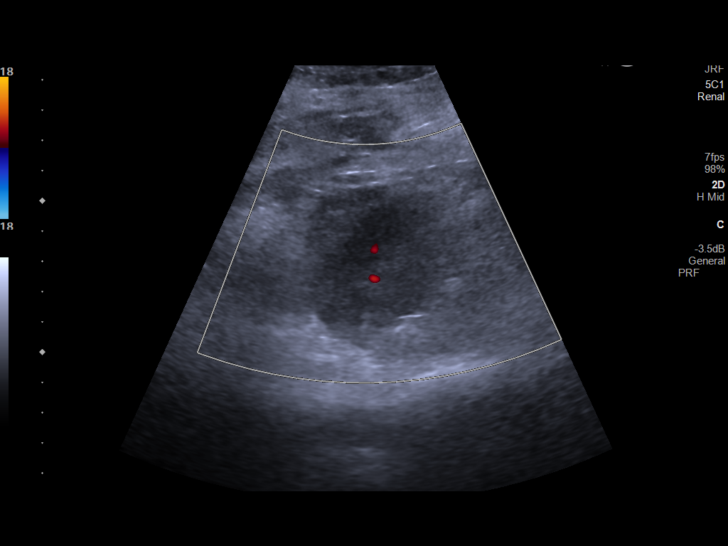
[im 34/41]
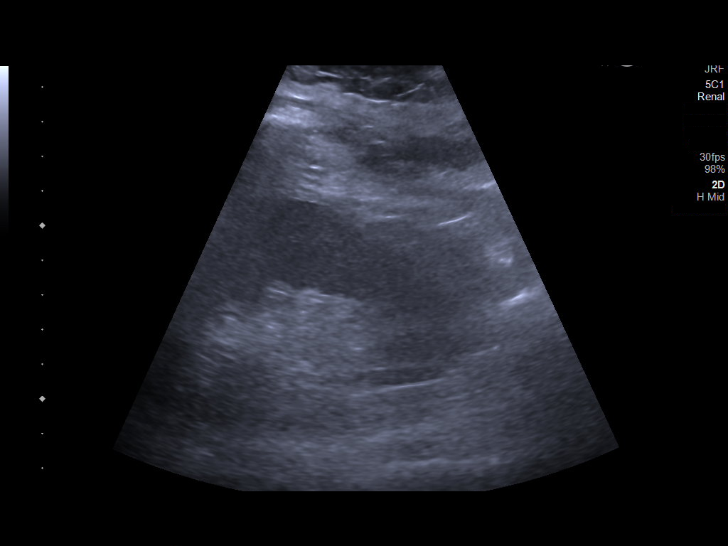
[im 37/41]
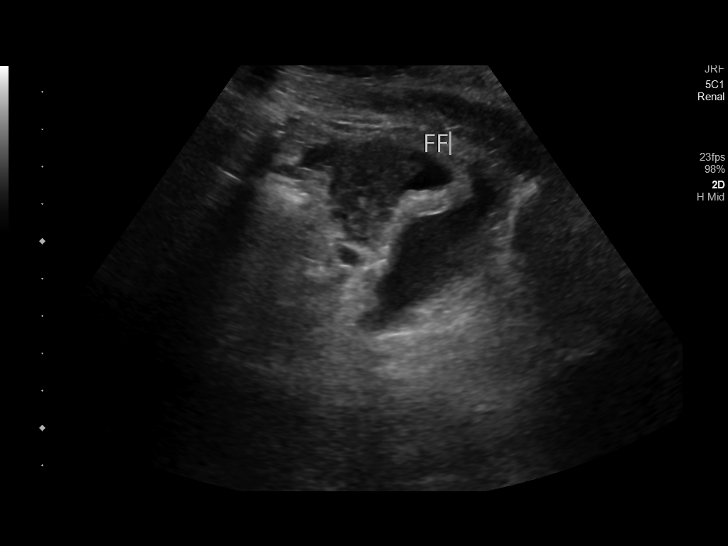
[im 41/41]
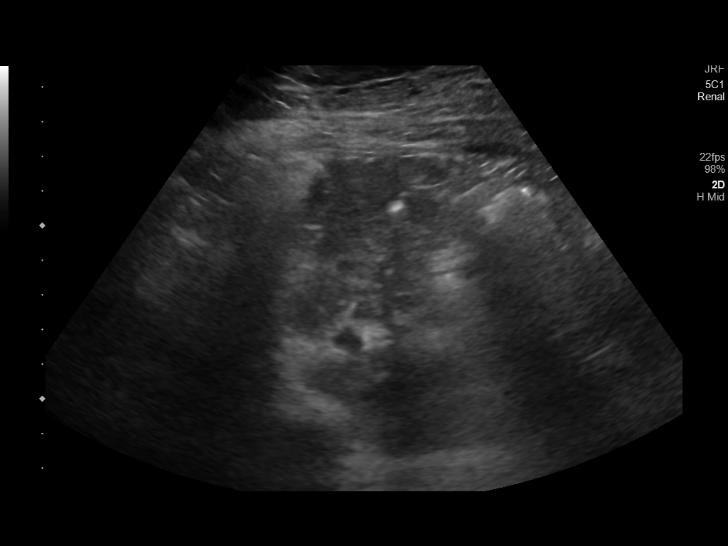

[14 of 25 positions shown; findings below may reference images not displayed]

FINDINGS: Right Kidney:

Renal measurements: 12.7 x 5.5 x 5.1 cm = volume: 186 mL.
Echogenicity within normal limits. No mass or hydronephrosis
visualized.

Left Kidney:

Renal measurements: 10.8 x 6.5 x 5.9 cm = volume: Is 219 mL.
Echogenicity within normal limits. No mass or hydronephrosis
visualized. Small perinephric hematoma noted along the lower pole
measuring 3.6 x 2.5 x 1.7 cm. This previously measured 2.7 by 1.7 x
1.5 cm.

Bladder:

Appears normal for degree of bladder distention.

Other:

Small amount of free fluid adjacent to the urinary bladder.
IMPRESSION: Small perinephric hematoma along the lower pole of the left kidney
in the area of prior biopsy, slightly increased in size since prior
study.

Small amount of free fluid in the pelvis.

## 2022-03-04 ENCOUNTER — Encounter: Payer: Self-pay | Admitting: Advanced Practice Midwife

## 2022-03-04 ENCOUNTER — Ambulatory Visit: Payer: BC Managed Care – PPO | Admitting: Advanced Practice Midwife

## 2022-03-04 VITALS — BP 126/80 | HR 54 | Ht 65.0 in | Wt 146.0 lb

## 2022-03-04 DIAGNOSIS — Z9189 Other specified personal risk factors, not elsewhere classified: Secondary | ICD-10-CM

## 2022-03-04 DIAGNOSIS — Z308 Encounter for other contraceptive management: Secondary | ICD-10-CM

## 2022-03-04 DIAGNOSIS — Z01419 Encounter for gynecological examination (general) (routine) without abnormal findings: Secondary | ICD-10-CM | POA: Diagnosis not present

## 2022-03-04 NOTE — Progress Notes (Signed)
GYNECOLOGY ANNUAL PREVENTATIVE CARE ENCOUNTER NOTE  History:     Tamara Mills is a 31 y.o. [id number] female here for a routine annual gynecologic exam.  Current complaints: none.   Denies abnormal vaginal bleeding, discharge, pelvic pain, problems with intercourse or other gynecologic concerns.   Patient is interested in genetic screening. She is married, works as International aid/development worker. 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    Gynecologic History Patient's last menstrual period was 02/25/2022 (approximate). Contraception: vasectomy Last Pap: 12/25/2020. Result was normal with negative HPV   Obstetric History OB History  Gravida Para Term Preterm AB Living  2 2 2     2   SAB IAB Ectopic Multiple Live Births        0 2    # Outcome Date GA Lbr Len/2nd Weight Sex Delivery Anes PTL Lv  2 Term 06/09/20 [redacted]w[redacted]d / 00:14 7 lb 12.2 oz (3.52 kg) M Vag-Spont EPI  LIV  1 Term 09/10/17 [redacted]w[redacted]d / 04:02 7 lb 15 oz (3.6 kg) F Vag-Spont EPI  LIV    Past Medical History:  Diagnosis Date   Acne    Amenorrhea    Hx of   Anxiety    COVID-19    Eating disorder    ? eating disorder in past ( pt states just running too much)   Hand eczema    Hypertension    Nephrotic syndrome 09/23/2020   Seasonal allergies    Underweight    Hx of    Past Surgical History:  Procedure Laterality Date   WISDOM TOOTH EXTRACTION      Current Outpatient Medications on File Prior to Visit  Medication Sig Dispense Refill   betamethasone dipropionate 0.05 % cream Apply topically 2 (two) times daily. To affected area of hair loss 30 g 0   Vilazodone HCl (VIIBRYD) 40 MG TABS Take 1 tablet (40 mg total) by mouth daily. 90 tablet 3   Drospirenone (SLYND) 4 MG TABS Take po as directed 28 tablet 11   No current facility-administered medications on file prior to visit.    Allergies  Allergen Reactions   Ortho Tri-Cyclen [Norgestimate-Eth Estradiol]     nausea   Nickel Rash    Social History:  reports that she has never  smoked. She has never used smokeless tobacco. She reports current alcohol use. She reports that she does not use drugs.  Family History  Problem Relation Age of Onset   Crohn's disease Brother    Ulcerative colitis Brother    Sudden Cardiac Death Brother    Hypertension Mother        had also preeclampsia   Diabetes Maternal Grandmother        Type 1   Cancer Paternal Grandmother 53       breast   Breast cancer Paternal Grandmother    Heart failure Paternal Grandfather        heart disease   Cancer Paternal Aunt 52       breast   Breast cancer Paternal Aunt    Ovarian cancer Neg Hx    Colon cancer Neg Hx    Esophageal cancer Neg Hx    Rectal cancer Neg Hx    Stomach cancer Neg Hx     The following portions of the patient's history were reviewed and updated as appropriate: allergies, current medications, past family history, past medical history, past social history, past surgical history and problem list.  Review of Systems Pertinent items noted in HPI and  remainder of comprehensive ROS otherwise negative.  Physical Exam:  BP 126/80   Pulse (!) 54   Ht 5\' 5"  (1.651 m)   Wt 146 lb (66.2 kg)   LMP 02/25/2022 (Approximate)   BMI 24.30 kg/m  CONSTITUTIONAL: Well-developed, well-nourished female in no acute distress.  HENT:  Normocephalic, atraumatic, External right and left ear normal.  EYES: Conjunctivae and EOM are normal. Pupils are equal, round, and reactive to light. No scleral icterus.  NECK: Normal range of motion, supple, no masses.  Normal thyroid.  SKIN: Skin is warm and dry. No rash noted. Not diaphoretic. No erythema. No pallor. MUSCULOSKELETAL: Normal range of motion. No tenderness.  No cyanosis, clubbing, or edema. NEUROLOGIC: Alert and oriented to person, place, and time. Normal reflexes, muscle tone coordination.  PSYCHIATRIC: Normal mood and affect. Normal behavior. Normal judgment and thought content. CARDIOVASCULAR: Normal heart rate noted, regular  rhythm RESPIRATORY: Clear to auscultation bilaterally. Effort and breath sounds normal, no problems with respiration noted. BREASTS: Symmetric in size. No masses, tenderness, skin changes, nipple drainage, or lymphadenopathy bilaterally. Performed in the presence of a chaperone. ABDOMEN: Soft, no distention noted.  No tenderness, rebound or guarding.  PELVIC: Deferred   Assessment and Plan:    1. Well woman exam - No concerning or atypical findings - Pap not due - Given pamphlet for 02/27/2022 screening  2. Relies on partner vasectomy for contraception   RTC in one year for well woman exam Routine preventative health maintenance measures emphasized. Please refer to After Visit Summary for other counseling recommendations.     [employer], MSA, MSN, CNM Certified Nurse Midwife, [employer] [facility] [employer], clinical, [facility]

## 2022-03-04 NOTE — Progress Notes (Signed)
Annual   LMP: 02/25/2022 cycles are monthly  Husband had Vasectomy in 07/2021.  Last Pap: 12/25/20 WNL pt rather do paps every 3 yrs no hx of abnormal paps .  Family Hx of Breast Cancer.  CC: None   Pt wants to discuss preventive Genetic screening BRCA pt notes various cancers in her family

## 2022-03-22 DIAGNOSIS — L638 Other alopecia areata: Secondary | ICD-10-CM | POA: Diagnosis not present

## 2022-04-27 ENCOUNTER — Encounter: Payer: Self-pay | Admitting: Certified Nurse Midwife

## 2022-05-10 ENCOUNTER — Encounter (INDEPENDENT_AMBULATORY_CARE_PROVIDER_SITE_OTHER): Payer: Self-pay

## 2022-05-13 DIAGNOSIS — L638 Other alopecia areata: Secondary | ICD-10-CM | POA: Diagnosis not present

## 2022-05-25 ENCOUNTER — Other Ambulatory Visit: Payer: Self-pay | Admitting: Family Medicine

## 2022-05-25 NOTE — Telephone Encounter (Signed)
Last filled on 05/21/21 #90 tab/ 3 refills, last OV was 11/18/21

## 2022-06-17 DIAGNOSIS — F411 Generalized anxiety disorder: Secondary | ICD-10-CM | POA: Diagnosis not present

## 2022-06-17 DIAGNOSIS — F902 Attention-deficit hyperactivity disorder, combined type: Secondary | ICD-10-CM | POA: Diagnosis not present

## 2022-07-01 DIAGNOSIS — F411 Generalized anxiety disorder: Secondary | ICD-10-CM | POA: Diagnosis not present

## 2022-07-01 DIAGNOSIS — F902 Attention-deficit hyperactivity disorder, combined type: Secondary | ICD-10-CM | POA: Diagnosis not present

## 2022-08-03 DIAGNOSIS — F902 Attention-deficit hyperactivity disorder, combined type: Secondary | ICD-10-CM | POA: Diagnosis not present

## 2022-08-03 DIAGNOSIS — F411 Generalized anxiety disorder: Secondary | ICD-10-CM | POA: Diagnosis not present

## 2022-08-17 DIAGNOSIS — F411 Generalized anxiety disorder: Secondary | ICD-10-CM | POA: Diagnosis not present

## 2022-08-17 DIAGNOSIS — F902 Attention-deficit hyperactivity disorder, combined type: Secondary | ICD-10-CM | POA: Diagnosis not present

## 2022-08-23 DIAGNOSIS — H10013 Acute follicular conjunctivitis, bilateral: Secondary | ICD-10-CM | POA: Diagnosis not present

## 2022-09-07 DIAGNOSIS — F902 Attention-deficit hyperactivity disorder, combined type: Secondary | ICD-10-CM | POA: Diagnosis not present

## 2022-09-07 DIAGNOSIS — F411 Generalized anxiety disorder: Secondary | ICD-10-CM | POA: Diagnosis not present

## 2022-09-14 DIAGNOSIS — F902 Attention-deficit hyperactivity disorder, combined type: Secondary | ICD-10-CM | POA: Diagnosis not present

## 2022-09-14 DIAGNOSIS — F411 Generalized anxiety disorder: Secondary | ICD-10-CM | POA: Diagnosis not present

## 2022-10-05 DIAGNOSIS — F411 Generalized anxiety disorder: Secondary | ICD-10-CM | POA: Diagnosis not present

## 2022-10-05 DIAGNOSIS — F902 Attention-deficit hyperactivity disorder, combined type: Secondary | ICD-10-CM | POA: Diagnosis not present

## 2022-10-12 DIAGNOSIS — F411 Generalized anxiety disorder: Secondary | ICD-10-CM | POA: Diagnosis not present

## 2022-10-12 DIAGNOSIS — F902 Attention-deficit hyperactivity disorder, combined type: Secondary | ICD-10-CM | POA: Diagnosis not present

## 2022-10-19 DIAGNOSIS — F411 Generalized anxiety disorder: Secondary | ICD-10-CM | POA: Diagnosis not present

## 2022-10-19 DIAGNOSIS — F902 Attention-deficit hyperactivity disorder, combined type: Secondary | ICD-10-CM | POA: Diagnosis not present

## 2022-11-18 DIAGNOSIS — F411 Generalized anxiety disorder: Secondary | ICD-10-CM | POA: Diagnosis not present

## 2022-11-18 DIAGNOSIS — F902 Attention-deficit hyperactivity disorder, combined type: Secondary | ICD-10-CM | POA: Diagnosis not present

## 2022-11-23 DIAGNOSIS — F902 Attention-deficit hyperactivity disorder, combined type: Secondary | ICD-10-CM | POA: Diagnosis not present

## 2022-11-23 DIAGNOSIS — F411 Generalized anxiety disorder: Secondary | ICD-10-CM | POA: Diagnosis not present

## 2022-12-07 DIAGNOSIS — F411 Generalized anxiety disorder: Secondary | ICD-10-CM | POA: Diagnosis not present

## 2022-12-07 DIAGNOSIS — F902 Attention-deficit hyperactivity disorder, combined type: Secondary | ICD-10-CM | POA: Diagnosis not present

## 2023-01-04 DIAGNOSIS — F902 Attention-deficit hyperactivity disorder, combined type: Secondary | ICD-10-CM | POA: Diagnosis not present

## 2023-01-04 DIAGNOSIS — F411 Generalized anxiety disorder: Secondary | ICD-10-CM | POA: Diagnosis not present

## 2023-01-12 ENCOUNTER — Encounter: Payer: Self-pay | Admitting: Obstetrics and Gynecology

## 2023-01-12 ENCOUNTER — Ambulatory Visit: Payer: BC Managed Care – PPO | Admitting: Obstetrics and Gynecology

## 2023-01-12 VITALS — BP 144/88 | HR 60 | Wt 131.0 lb

## 2023-01-12 DIAGNOSIS — L659 Nonscarring hair loss, unspecified: Secondary | ICD-10-CM

## 2023-01-12 DIAGNOSIS — R030 Elevated blood-pressure reading, without diagnosis of hypertension: Secondary | ICD-10-CM

## 2023-01-12 DIAGNOSIS — L680 Hirsutism: Secondary | ICD-10-CM

## 2023-01-12 DIAGNOSIS — R6882 Decreased libido: Secondary | ICD-10-CM

## 2023-01-12 DIAGNOSIS — R7989 Other specified abnormal findings of blood chemistry: Secondary | ICD-10-CM

## 2023-01-12 DIAGNOSIS — Z1329 Encounter for screening for other suspected endocrine disorder: Secondary | ICD-10-CM | POA: Diagnosis not present

## 2023-01-12 DIAGNOSIS — N049 Nephrotic syndrome with unspecified morphologic changes: Secondary | ICD-10-CM

## 2023-01-12 MED ORDER — SPIRONOLACTONE 50 MG PO TABS: 50.0000 mg | ORAL_TABLET | Freq: Two times a day (BID) | ORAL | 3 refills | Status: DC

## 2023-01-12 NOTE — Progress Notes (Signed)
RGYN pt here to discuss possible PCOS.  LMP:01/09/23 Monthly .Moderate flow . Last cycle was 26 days usually every 28.   c/o: Facial hair, elevated B/P, vertigo , dry eyes , mood swings, Alopecia, decreased libido.  Unsure if she needs hormones checked.   No longer on Birth Control husband has vasectomy pt feels in stopping BCP sx's got worse.

## 2023-01-12 NOTE — Progress Notes (Signed)
Obstetrics and Gynecology Visit Return Patient Evaluation  Appointment Date: 01/12/2023  Primary Care Provider: Judy Pimple  OBGYN Clinic: [facility]  Chief Complaint: concern for PCOS  History of Present Illness:  Tamara Mills is a 32 y.o. P2 with above chief complaint  Interval History: Since that time, she states that since stopping slynd, due to husband having vasectomy, she's noticed more facial terminal hair, as well as alopecia and decreased libido.   No vaginal/vaginitis s/s.  Periods, qmonth, regular, <1 week, not heavy or painful.   Review of Systems: as noted in the History of Present Illness.  Patient Active Problem List   Diagnosis Date Noted   Alopecia areata 11/18/2021   Generalized edema 09/04/2020   Family history of autoimmune disorder 09/04/2020   Essential hypertension 11/04/2017   Family history of sudden cardiac death 2016/07/08   Acne vulgaris 04/28/2012   Anxiety disorder 02/28/2007   DEFORMITY, CAVUS, FOOT, ACQUIRED 02/23/2007   Medications:  Wilder Mossa. Myron had no medications administered during this visit. Current Outpatient Medications  Medication Sig Dispense Refill   amphetamine-dextroamphetamine (ADDERALL) 20 MG tablet Take 20 mg by mouth daily.     Vilazodone HCl (VIIBRYD) 40 MG TABS TAKE 1 TABLET BY MOUTH EVERY DAY 90 tablet 3   No current facility-administered medications for this visit.    Allergies: is allergic to ortho tri-cyclen [norgestimate-eth estradiol] and nickel.  Physical Exam:  BP (!) 144/88   Pulse 60   Wt 131 lb (59.4 kg)   LMP 01/09/2023   BMI 21.80 kg/m  Body mass index is 21.8 kg/m. General appearance: Well nourished, well developed female in no acute distress.  Neuro/Psych:  Normal mood and affect.    Labs: 11/2021 tsh wnl, 2022 pap wnl   Assessment: patient stable  Plan:  1. Hirsutism I d/w her re: the [place] criteria with PCOS and the skinny variant of it, and she does have  hirsutism s/s. Will check basic labs. Pt desires to avoid hormones so I feel it's reasonable to try spironolactone. She is wondering about metformin but I told her I don't recommend starting two new types of medications. Also, metformin is thought to help with PCOS in regards to weight loss, insulin resistance, etc and she doesn't have those features - TSH+Prl+TestT+TestF+17OHP - DHEA-sulfate - Beta hCG quant (ref lab) - Estradiol - Comprehensive metabolic panel  2. Alopecia  3. Libido, decreased D/w her that likely multi-factorial and possible related to hirsute s/s and feelings associated with that. F/u s/s and may benefit from counseling/cbt  4. Transient hypertension   RTC: f/u in 4-5 month, potentially increase dose  No follow-ups on file.  Future Appointments  Date Time Provider Department Center  03/08/2023 10:15 AM Milas Hock, MD [facility]    Cornelia Copa MD Attending [facility])

## 2023-01-17 ENCOUNTER — Encounter: Payer: Self-pay | Admitting: Obstetrics and Gynecology

## 2023-01-17 DIAGNOSIS — L680 Hirsutism: Secondary | ICD-10-CM | POA: Insufficient documentation

## 2023-01-17 DIAGNOSIS — R7989 Other specified abnormal findings of blood chemistry: Secondary | ICD-10-CM | POA: Insufficient documentation

## 2023-01-17 LAB — COMPREHENSIVE METABOLIC PANEL
ALT: 14 IU/L (ref 0–32)
AST: 16 IU/L (ref 0–40)
Albumin: 4.8 g/dL (ref 3.9–4.9)
Alkaline Phosphatase: 69 IU/L (ref 44–121)
BUN/Creatinine Ratio: 14 (ref 9–23)
BUN: 10 mg/dL (ref 6–20)
Bilirubin Total: 0.9 mg/dL (ref 0.0–1.2)
CO2: 20 mmol/L (ref 20–29)
Calcium: 9.6 mg/dL (ref 8.7–10.2)
Chloride: 103 mmol/L (ref 96–106)
Creatinine, Ser: 0.7 mg/dL (ref 0.57–1.00)
Globulin, Total: 1.9 g/dL (ref 1.5–4.5)
Glucose: 86 mg/dL (ref 70–99)
Potassium: 4.3 mmol/L (ref 3.5–5.2)
Sodium: 139 mmol/L (ref 134–144)
Total Protein: 6.7 g/dL (ref 6.0–8.5)
eGFR: 117 mL/min/{1.73_m2} (ref >59)

## 2023-01-17 LAB — BETA HCG QUANT (REF LAB): hCG Quant: <1 m[IU]/mL

## 2023-01-17 LAB — TSH+PRL+TESTT+TESTF+17OHP
17-Hydroxyprogesterone: 37 ng/dL
Prolactin: 6 ng/mL (ref 4.8–33.4)
TSH: 1.19 u[IU]/mL (ref 0.450–4.500)
Testosterone, Free: 1.8 pg/mL (ref 0.0–4.2)
Testosterone, Total, LC/MS: 30.6 ng/dL (ref 10.0–55.0)

## 2023-01-17 LAB — ESTRADIOL: Estradiol: 33.3 pg/mL

## 2023-01-17 LAB — DHEA-SULFATE: DHEA-SO4: 703 ug/dL — ABNORMAL HIGH (ref 84.8–378.0)

## 2023-01-17 NOTE — Addendum Note (Signed)
Addended by: Dunlap Bing on: 01/17/2023 06:07 PM   Modules accepted: Orders

## 2023-01-19 ENCOUNTER — Telehealth: Payer: Self-pay | Admitting: Family Medicine

## 2023-01-19 ENCOUNTER — Encounter: Payer: Self-pay | Admitting: Family Medicine

## 2023-01-19 DIAGNOSIS — R7989 Other specified abnormal findings of blood chemistry: Secondary | ICD-10-CM

## 2023-01-19 DIAGNOSIS — L680 Hirsutism: Secondary | ICD-10-CM

## 2023-01-19 NOTE — Telephone Encounter (Signed)
I sent this as a my chart message as well- if pt does not get it please reach out to her  My internet connection is difficult this week but I will try to be on /off computer today    Your dad touched base with me about your lab and symptoms - (so sorry I am out of town with worse internet than expected) and want to try and go ahead and image your adrenals  How are you feeling?   From what I can see CT is the preferred first imaging  Is this ok ?  Do you have any contrast allergies? Do you prefer a certain area or imaging center if we have a choice ?  Let me know  I will be on and off the computer today when I can  So sorry you are going through this !   I will wait to hear from you before ordering anything

## 2023-01-19 NOTE — Telephone Encounter (Signed)
Per secured message from Referral coordinator has been scheduled and they will reach out to patient.

## 2023-01-19 NOTE — Telephone Encounter (Signed)
Patient has reached out via my chart. No further action needed on this encounter.

## 2023-01-19 NOTE — Telephone Encounter (Signed)
I ordered an adreanal CT for Tamara Mills   Would you please send to the referral teams / etc?  If there is a preferred test or order I need to sign let me know   Thanks   I will be on and off the computer today

## 2023-01-19 NOTE — Telephone Encounter (Signed)
Teams message sent to referral. Holding to verify no further information needed.

## 2023-01-20 ENCOUNTER — Encounter: Payer: Self-pay | Admitting: Family Medicine

## 2023-01-20 ENCOUNTER — Ambulatory Visit
Admission: RE | Admit: 2023-01-20 | Discharge: 2023-01-20 | Disposition: A | Discharge status: Home / Self Care | Payer: BC Managed Care – PPO | Source: Ambulatory Visit | Attending: Family Medicine | Admitting: Family Medicine

## 2023-01-20 DIAGNOSIS — R7989 Other specified abnormal findings of blood chemistry: Secondary | ICD-10-CM | POA: Diagnosis not present

## 2023-01-20 DIAGNOSIS — L680 Hirsutism: Secondary | ICD-10-CM | POA: Insufficient documentation

## 2023-01-20 DIAGNOSIS — K429 Umbilical hernia without obstruction or gangrene: Secondary | ICD-10-CM | POA: Diagnosis not present

## 2023-02-01 ENCOUNTER — Ambulatory Visit: Payer: BC Managed Care – PPO | Admitting: Family Medicine

## 2023-02-01 ENCOUNTER — Encounter: Payer: Self-pay | Admitting: Family Medicine

## 2023-02-01 VITALS — BP 120/78 | HR 62 | Temp 98.0°F | Ht 65.0 in | Wt 132.0 lb

## 2023-02-01 DIAGNOSIS — R5383 Other fatigue: Secondary | ICD-10-CM

## 2023-02-01 DIAGNOSIS — M2550 Pain in unspecified joint: Secondary | ICD-10-CM

## 2023-02-01 DIAGNOSIS — H04129 Dry eye syndrome of unspecified lacrimal gland: Secondary | ICD-10-CM | POA: Diagnosis not present

## 2023-02-01 DIAGNOSIS — E559 Vitamin D deficiency, unspecified: Secondary | ICD-10-CM | POA: Diagnosis not present

## 2023-02-01 DIAGNOSIS — F411 Generalized anxiety disorder: Secondary | ICD-10-CM | POA: Diagnosis not present

## 2023-02-01 DIAGNOSIS — F902 Attention-deficit hyperactivity disorder, combined type: Secondary | ICD-10-CM | POA: Diagnosis not present

## 2023-02-01 LAB — CBC WITH DIFFERENTIAL/PLATELET
Basophils Absolute: 0 10*3/uL (ref 0.0–0.1)
Basophils Relative: 0.5 % (ref 0.0–3.0)
Eosinophils Absolute: 0.1 10*3/uL (ref 0.0–0.7)
Eosinophils Relative: 2.2 % (ref 0.0–5.0)
HCT: 41.4 % (ref 36.0–46.0)
Hemoglobin: 13.6 g/dL (ref 12.0–15.0)
Lymphocytes Relative: 32.1 % (ref 12.0–46.0)
Lymphs Abs: 1.6 10*3/uL (ref 0.7–4.0)
MCHC: 32.8 g/dL (ref 30.0–36.0)
MCV: 89.9 fl (ref 78.0–100.0)
Monocytes Absolute: 0.4 10*3/uL (ref 0.1–1.0)
Monocytes Relative: 7.1 % (ref 3.0–12.0)
Neutro Abs: 2.9 10*3/uL (ref 1.4–7.7)
Neutrophils Relative %: 58.1 % (ref 43.0–77.0)
Platelets: 279 10*3/uL (ref 150.0–400.0)
RBC: 4.61 Mil/uL (ref 3.87–5.11)
RDW: 13.3 % (ref 11.5–15.5)
WBC: 5 10*3/uL (ref 4.0–10.5)

## 2023-02-01 LAB — SEDIMENTATION RATE: Sed Rate: 4 mm/hr (ref 0–20)

## 2023-02-01 LAB — C-REACTIVE PROTEIN: CRP: <1 mg/dL (ref 0.5–20.0)

## 2023-02-01 LAB — VITAMIN B12: Vitamin B-12: 216 pg/mL (ref 211–911)

## 2023-02-01 NOTE — Progress Notes (Signed)
Patient ID: Tamara Mills, female    DOB: 10-23-1990, 32 y.o.   MRN: [id number]  This visit was conducted in person.  BP 120/78 (BP Location: Left Arm, Patient Position: Sitting, Cuff Size: Normal)   Pulse 62   Temp 98 F (36.7 C) (Temporal)   Ht 5\' 5"  (1.651 m)   Wt 132 lb (59.9 kg)   LMP 01/09/2023   SpO2 99%   BMI 21.97 kg/m    CC:  Chief Complaint  Patient presents with   Fatigue   Stiff Joints   Dry Eyes    Eye Doctor recommended getting tested for Sjogren;s syndrome    Subjective:   HPI: Tamara Mills is a 32 y.o. female presenting on 02/01/2023 for Fatigue, Stiff Joints, and Dry Eyes (Eye Doctor recommended getting tested for Sjogren;s syndrome)   She has had dry eye for  6 months.. eye MD noted decreased tear formation.  She has  noted stiffness and pain in PIP  bilaterally, worse lately. No heat and redness.  No color change.  Cracking of nails.  Very dry mouth as well. ( Did not get better  off Adderall)  No recurrent rash.  Has noted fatigue for a while, not severe but present.   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  Elevated RF in past.  May be currently in  perimenopause.   Alopecia and hirsutism.  Brother with chron's, aunt with addison's  No family history of RA, sjogren's or lupus      Relevant past medical, surgical, family and social history reviewed and updated as indicated. Interim medical history since our last visit reviewed. Allergies and medications reviewed and updated. Outpatient Medications Prior to Visit  Medication Sig Dispense Refill   ADDERALL XR 20 MG 24 hr capsule Take 20 mg by mouth every morning.     Polyethyl Glycol-Propyl Glycol (SYSTANE) 0.4-0.3 % SOLN Apply 1 drop to eye in the morning, at noon, and at bedtime.     spironolactone (ALDACTONE) 50 MG tablet Take 1 tablet (50 mg total) by mouth 2 (two) times daily. 60 tablet 3   Vilazodone HCl (VIIBRYD) 40 MG TABS TAKE 1 TABLET BY MOUTH EVERY DAY 90  tablet 3   amphetamine-dextroamphetamine (ADDERALL) 20 MG tablet Take 20 mg by mouth daily.     No facility-administered medications prior to visit.     Per HPI unless specifically indicated in ROS section below Review of Systems  Constitutional:  Positive for fatigue. Negative for fever.  HENT:  Negative for congestion.   Eyes:  Negative for pain.  Respiratory:  Negative for cough and shortness of breath.   Cardiovascular:  Negative for chest pain, palpitations and leg swelling.  Gastrointestinal:  Negative for abdominal pain.  Genitourinary:  Negative for dysuria and vaginal bleeding.  Musculoskeletal:  Negative for back pain.  Neurological:  Negative for syncope, light-headedness and headaches.  Psychiatric/Behavioral:  Negative for dysphoric mood.    Objective:  BP 120/78 (BP Location: Left Arm, Patient Position: Sitting, Cuff Size: Normal)   Pulse 62   Temp 98 F (36.7 C) (Temporal)   Ht 5\' 5"  (1.651 m)   Wt 132 lb (59.9 kg)   LMP 01/09/2023   SpO2 99%   BMI 21.97 kg/m   Wt Readings from Last 3 Encounters:  02/01/23 132 lb (59.9 kg)  01/12/23 131 lb (59.4 kg)  03/04/22 146 lb (66.2 kg)      Physical Exam Constitutional:  General: She is not in acute distress.    Appearance: Normal appearance. She is well-developed. She is not ill-appearing or toxic-appearing.  HENT:     Head: Normocephalic.     Right Ear: Hearing, tympanic membrane, ear canal and external ear normal. Tympanic membrane is not erythematous, retracted or bulging.     Left Ear: Hearing, tympanic membrane, ear canal and external ear normal. Tympanic membrane is not erythematous, retracted or bulging.     Nose: No mucosal edema or rhinorrhea.     Right Sinus: No maxillary sinus tenderness or frontal sinus tenderness.     Left Sinus: No maxillary sinus tenderness or frontal sinus tenderness.     Mouth/Throat:     Mouth: Oropharynx is clear and moist and mucous membranes are normal.     Pharynx:  Uvula midline.  Eyes:     General: Lids are normal. Lids are everted, no foreign bodies appreciated.     Extraocular Movements: EOM normal.     Conjunctiva/sclera: Conjunctivae normal.     Pupils: Pupils are equal, round, and reactive to light.  Neck:     Thyroid: No thyroid mass or thyromegaly.     Vascular: No carotid bruit.     Trachea: Trachea normal.  Cardiovascular:     Rate and Rhythm: Normal rate and regular rhythm.     Pulses: Normal pulses.     Heart sounds: Normal heart sounds, S1 normal and S2 normal. No murmur heard.    No friction rub. No gallop.  Pulmonary:     Effort: Pulmonary effort is normal. No tachypnea or respiratory distress.     Breath sounds: Normal breath sounds. No decreased breath sounds, wheezing, rhonchi or rales.  Abdominal:     General: Bowel sounds are normal.     Palpations: Abdomen is soft.     Tenderness: There is no abdominal tenderness.  Musculoskeletal:     Cervical back: Normal range of motion and neck supple.     Comments: Mild deformity, no heat, redness or swelling in PIP joints  Skin:    General: Skin is warm, dry and intact.     Findings: No rash.     Comments:  Dry brittle nails, dry peeling skin at nail edge  Neurological:     Mental Status: She is alert.  Psychiatric:        Mood and Affect: Mood is not anxious or depressed.        Speech: Speech normal.        Behavior: Behavior normal. Behavior is cooperative.        Thought Content: Thought content normal.        Cognition and Memory: Cognition and memory normal.        Judgment: Judgment normal.       Results for orders placed or performed in visit on 01/12/23  TSH+Prl+TestT+TestF+17OHP  Result Value Ref Range   TSH 1.190 0.450 - 4.500 uIU/mL   Testosterone, Total, LC/MS 30.6 10.0 - 55.0 ng/dL   Testosterone, Free 1.8 0.0 - 4.2 pg/mL   Prolactin 6.0 4.8 - 33.4 ng/mL   17-Hydroxyprogesterone 37 ng/dL  DHEA-sulfate  Result Value Ref Range   DHEA-SO4 703.0 (H) 84.8 -  378.0 ug/dL  Beta hCG quant (ref lab)  Result Value Ref Range   hCG Quant <1 mIU/mL  Estradiol  Result Value Ref Range   Estradiol 33.3 pg/mL  Comprehensive metabolic panel  Result Value Ref Range   Glucose 86 70 - 99 mg/dL  BUN 10 6 - 20 mg/dL   Creatinine, Ser 8.29 0.57 - 1.00 mg/dL   eGFR 562 >13 YQ/MVH/8.46   BUN/Creatinine Ratio 14 9 - 23   Sodium 139 134 - 144 mmol/L   Potassium 4.3 3.5 - 5.2 mmol/L   Chloride 103 96 - 106 mmol/L   CO2 20 20 - 29 mmol/L   Calcium 9.6 8.7 - 10.2 mg/dL   Total Protein 6.7 6.0 - 8.5 g/dL   Albumin 4.8 3.9 - 4.9 g/dL   Globulin, Total 1.9 1.5 - 4.5 g/dL   Bilirubin Total 0.9 0.0 - 1.2 mg/dL   Alkaline Phosphatase 69 44 - 121 IU/L   AST 16 0 - 40 IU/L   ALT 14 0 - 32 IU/L    Assessment and Plan Both symptoms potentially consistent with autoimmune disease.  Location of arthralgia symmetric in small joints but in the PIP joint.  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  Will move forward with evaluation of Sjogren's given constellation of symptoms especially dry eyes and dry mouth. If negative evaluation but symptoms continued will move forward with rheumatology referral.  Other fatigue -     Vitamin B12 -     CBC with Differential/Platelet -     Vitamin D 1,25 dihydroxy  Arthralgia, unspecified joint -     ANA -     C-reactive protein -     Cyclic citrul peptide antibody, IgG -     Rheumatoid factor -     Sedimentation rate -     Sjogren's syndrome antibods(ssa + ssb)  Dry eye    No follow-ups on file.   Kerby Nora, MD

## 2023-02-02 ENCOUNTER — Encounter: Payer: Self-pay | Admitting: Family Medicine

## 2023-02-02 DIAGNOSIS — M2550 Pain in unspecified joint: Secondary | ICD-10-CM

## 2023-02-02 DIAGNOSIS — R768 Other specified abnormal immunological findings in serum: Secondary | ICD-10-CM | POA: Insufficient documentation

## 2023-02-02 DIAGNOSIS — Z832 Family history of diseases of the blood and blood-forming organs and certain disorders involving the immune mechanism: Secondary | ICD-10-CM

## 2023-02-02 DIAGNOSIS — H04129 Dry eye syndrome of unspecified lacrimal gland: Secondary | ICD-10-CM

## 2023-02-02 LAB — SJOGREN'S SYNDROME ANTIBODS(SSA + SSB)
SSA (Ro) (ENA) Antibody, IgG: <1 AI
SSB (La) (ENA) Antibody, IgG: <1 AI

## 2023-02-02 LAB — RHEUMATOID FACTOR: Rheumatoid fact SerPl-aCnc: 70 IU/mL — ABNORMAL HIGH (ref <14)

## 2023-02-02 LAB — ANA: Anti Nuclear Antibody (ANA): NEGATIVE

## 2023-02-09 ENCOUNTER — Encounter (INDEPENDENT_AMBULATORY_CARE_PROVIDER_SITE_OTHER): Payer: Self-pay

## 2023-02-17 ENCOUNTER — Encounter: Payer: Self-pay | Admitting: Family Medicine

## 2023-02-21 ENCOUNTER — Telehealth: Payer: Self-pay | Admitting: Family Medicine

## 2023-02-21 NOTE — Telephone Encounter (Signed)
She changed her mind  Going to do labs at her visit

## 2023-02-21 NOTE — Telephone Encounter (Signed)
-----   Message from Lovena Neighbours sent at 02/08/2023  1:39 PM EDT ----- Regarding: Labs for 8.14.24 Please put physical lab orders in future. Thank you, Denny Peon

## 2023-02-23 ENCOUNTER — Other Ambulatory Visit: Payer: BC Managed Care – PPO

## 2023-03-02 ENCOUNTER — Ambulatory Visit (INDEPENDENT_AMBULATORY_CARE_PROVIDER_SITE_OTHER): Payer: BC Managed Care – PPO | Admitting: Family Medicine

## 2023-03-02 ENCOUNTER — Encounter: Payer: Self-pay | Admitting: Family Medicine

## 2023-03-02 VITALS — BP 126/86 | HR 74 | Temp 98.2°F | Ht 65.5 in | Wt 128.4 lb

## 2023-03-02 DIAGNOSIS — M2550 Pain in unspecified joint: Secondary | ICD-10-CM

## 2023-03-02 DIAGNOSIS — F411 Generalized anxiety disorder: Secondary | ICD-10-CM

## 2023-03-02 DIAGNOSIS — L680 Hirsutism: Secondary | ICD-10-CM

## 2023-03-02 DIAGNOSIS — L700 Acne vulgaris: Secondary | ICD-10-CM

## 2023-03-02 DIAGNOSIS — H04129 Dry eye syndrome of unspecified lacrimal gland: Secondary | ICD-10-CM

## 2023-03-02 DIAGNOSIS — R768 Other specified abnormal immunological findings in serum: Secondary | ICD-10-CM

## 2023-03-02 DIAGNOSIS — Z0000 Encounter for general adult medical examination without abnormal findings: Secondary | ICD-10-CM

## 2023-03-02 DIAGNOSIS — I10 Essential (primary) hypertension: Secondary | ICD-10-CM

## 2023-03-02 DIAGNOSIS — R7989 Other specified abnormal findings of blood chemistry: Secondary | ICD-10-CM

## 2023-03-02 LAB — LIPID PANEL
Cholesterol: 180 mg/dL (ref 0–200)
HDL: 55.3 mg/dL (ref >39.00)
LDL Cholesterol: 110 mg/dL — ABNORMAL HIGH (ref 0–99)
NonHDL: 124.24
Total CHOL/HDL Ratio: 3
Triglycerides: 69 mg/dL (ref 0.0–149.0)
VLDL: 13.8 mg/dL (ref 0.0–40.0)

## 2023-03-02 NOTE — Assessment & Plan Note (Signed)
Dry eyes followed a bout of conjunctivitis  Dry mouth also  ? If possible sjogren's  Has positive RF  Has appt with rheumatology in feb

## 2023-03-02 NOTE — Assessment & Plan Note (Signed)
Positive RA Some features or sjogren's  Has rheum appt in feb  No acute joint changes today

## 2023-03-02 NOTE — Progress Notes (Signed)
Subjective:    Patient ID: Tamara Mills, female    DOB: 11-20-1990, 32 y.o.   MRN: [id number]  HPI  Here for health maintenance exam and to review chronic medical problems  Has concerns about possible sjogren's also  Wt Readings from Last 3 Encounters:  03/02/23 128 lb 6 oz (58.2 kg)  02/01/23 132 lb (59.9 kg)  01/12/23 131 lb (59.4 kg)   21.04 kg/m  Vitals:   03/02/23 1019  BP: 126/86  Pulse: 74  Temp: 98.2 F (36.8 C)  SpO2: 98%    Immunization History  Administered Date(s) Administered   Influenza Split 04/22/2011, 03/31/2012   Influenza Whole 04/24/2007, 04/25/2009, 04/03/2010   Influenza,inj,Quad PF,6+ Mos 03/09/2013, 04/12/2014, 04/10/2015, 04/30/2016, 04/12/2018, 04/10/2020, 03/27/2021   Influenza-Unspecified 04/14/2017, 04/12/2019   Moderna Sars-Covid-2 Vaccination 07/16/2019, 08/13/2019   PPD Test 09/20/2011, 09/22/2012, 08/29/2013, 10/14/2017   Td 08/12/2002   Tdap 09/22/2012, 06/24/2017, 04/10/2020    Health Maintenance Due  Topic Date Due   Hepatitis C Screening  Never done   Dry eyes and dry mouth and joint complaints  History of elevated RA Lab Results  Component Value Date   ANA NEGATIVE 02/01/2023   RF 70 (H) 02/01/2023   Joints get stiff mostly in the cold (even air conditioning)  No am stiffness  Rheum referral - has appt in feb   History of possible pcos Was ref to endo -has appt in feb   Overall feels ok   Flu vaccine - in the fall   Mammogram-not yet  Fam history of [facility] genetic counselor due to family history , had a reassuring work up  Was ref to high risk breast clinic at [facility]- may /may start MRIs (unsure yet)  Self breast exam-no lumps   Gyn health- due for annual next week  Pap normal 12/2020 normal with neg HPV and 5 y recall May consider metformin for possible pcos  May want to go up on spironolactone    Bone health   Falls-none Fractures-none  Supplements - vit d  level is good  Exercise - [place] / strength  training work out  HIIT      Mood    03/02/2023   10:26 AM 02/01/2023   12:11 PM 11/18/2021    9:57 AM 11/18/2021    9:23 AM 12/25/2020   10:09 AM  Depression screen PHQ 2/9  Decreased Interest 0 0 0 0 0  Down, Depressed, Hopeless 0 0 0 0 0  PHQ - 2 Score 0 0 0 0 0  Altered sleeping 0      Tired, decreased energy 1      Change in appetite 0      Feeling bad or failure about yourself  0      Trouble concentrating 0      Moving slowly or fidgety/restless 0      Suicidal thoughts 0      PHQ-9 Score 1      Difficult doing work/chores Not difficult at all        GAD Vilazodone 40 mg daily    Adderall : now diagnosed with ADHD  It is really helping and helps the anxiety also  Planning to add 5 mg dose in the afternoon  Feels better overall !    HTN bp is stable today  No cp or palpitations or headaches or edema  No side effects to medicines  BP Readings from Last 3 Encounters:  03/02/23 126/86  02/01/23 120/78  01/12/23 Marland Kitchen)  144/88    Aldactone 50 mg bid - for acne and hirsutism   Past took arb -no longer needs Nephrology in the past for minimal change   Per pt- diastolic blood pressure is higher than it used to be   Continues to drink coffee    Last vitamin D 67 (D3) Lab Results  Component Value Date   VITAMINB12 216 02/01/2023  Taking vitamin b12 1000 mcg daily    Lab Results  Component Value Date   CRP <1.0 02/01/2023   Lab Results  Component Value Date   ESRSEDRATE 4 02/01/2023   Lab Results  Component Value Date   NA 139 01/12/2023   K 4.3 01/12/2023   CO2 20 01/12/2023   GLUCOSE 86 01/12/2023   BUN 10 01/12/2023   CREATININE 0.70 01/12/2023   CALCIUM 9.6 01/12/2023   GFR 115.76 11/18/2021   EGFR 117 01/12/2023   GFRNONAA >60 09/09/2020   Lab Results  Component Value Date   ALT 14 01/12/2023   AST 16 01/12/2023   ALKPHOS 69 01/12/2023   BILITOT 0.9 01/12/2023   Lab Results  Component Value Date   WBC 5.0 02/01/2023   HGB 13.6  02/01/2023   HCT 41.4 02/01/2023   MCV 89.9 02/01/2023   PLT 279.0 02/01/2023   Lab Results  Component Value Date   IRON 90 11/18/2021    Lab Results  Component Value Date   TSH 1.190 01/12/2023      Lab Results  Component Value Date   CHOL 180 03/02/2023   HDL 55.30 03/02/2023   LDLCALC 110 (H) 03/02/2023   TRIG 69.0 03/02/2023   CHOLHDL 3 03/02/2023  De for lipid screen  Eats healthy  Kids eat healthy- keeps good food in the house    Patient Active Problem List   Diagnosis Date Noted   Elevated rheumatoid factor 02/02/2023   Arthralgia 02/01/2023   Dry eye 02/01/2023   Elevated DHEA 01/17/2023   Hirsutism 01/17/2023   Alopecia areata 11/18/2021   Family history of autoimmune disorder 09/04/2020   Essential hypertension 11/04/2017   Family history of sudden cardiac death 06-27-16   Routine general medical examination at a health care facility 08/29/2013   Acne vulgaris 04/28/2012   Anxiety disorder 02/28/2007   DEFORMITY, CAVUS, FOOT, ACQUIRED 02/23/2007   Past Medical History:  Diagnosis Date   Acne    Allergy    Amenorrhea    Hx of   Anxiety    COVID-19    Eating disorder    ? eating disorder in past ( pt states just running too much)   Hand eczema    History of gestational hypertension 11/30/2019   Hypertension    Nephrotic syndrome 09/08/2020   After pregnancy   Seasonal allergies    Underweight    Hx of   Past Surgical History:  Procedure Laterality Date   WISDOM TOOTH EXTRACTION     Social History   Tobacco Use   Smoking status: Never   Smokeless tobacco: Never  Vaping Use   Vaping status: Never Used  Substance Use Topics   Alcohol use: Yes    Comment: occasional   Drug use: No   Family History  Problem Relation Age of Onset   Crohn's disease Brother    Ulcerative colitis Brother    Sudden Cardiac Death Brother    Hypertension Mother        had also preeclampsia   Diabetes Maternal Grandmother  Type 1   Cancer  Paternal Grandmother 35       breast   Breast cancer Paternal Grandmother    Heart failure Paternal Grandfather        heart disease   Cancer Paternal Aunt 57       breast   Breast cancer Paternal Aunt    Ovarian cancer Neg Hx    Colon cancer Neg Hx    Esophageal cancer Neg Hx    Rectal cancer Neg Hx    Stomach cancer Neg Hx    Allergies  Allergen Reactions   Ortho Tri-Cyclen [Norgestimate-Eth Estradiol]     nausea   Nickel Rash   Current Outpatient Medications on File Prior to Visit  Medication Sig Dispense Refill   ADDERALL XR 20 MG 24 hr capsule Take 20 mg by mouth every morning.     cyanocobalamin (VITAMIN B12) 1000 MCG tablet Take 1,000 mcg by mouth daily.     Polyethyl Glycol-Propyl Glycol (SYSTANE) 0.4-0.3 % SOLN Apply 1 drop to eye in the morning, at noon, and at bedtime.     spironolactone (ALDACTONE) 50 MG tablet Take 1 tablet (50 mg total) by mouth 2 (two) times daily. 60 tablet 3   Vilazodone HCl (VIIBRYD) 40 MG TABS TAKE 1 TABLET BY MOUTH EVERY DAY 90 tablet 3   No current facility-administered medications on file prior to visit.    Review of Systems  Constitutional:  Negative for activity change, appetite change, fatigue, fever and unexpected weight change.  HENT:  Negative for congestion, ear pain, rhinorrhea, sinus pressure and sore throat.   Eyes:  Negative for pain, redness and visual disturbance.  Respiratory:  Negative for cough, shortness of breath and wheezing.   Cardiovascular:  Negative for chest pain and palpitations.  Gastrointestinal:  Negative for abdominal pain, blood in stool, constipation and diarrhea.  Endocrine: Negative for polydipsia and polyuria.  Genitourinary:  Negative for dysuria, frequency and urgency.  Musculoskeletal:  Negative for arthralgias, back pain and myalgias.  Skin:  Negative for pallor and rash.       Hair growth on face Some improvement   Allergic/Immunologic: Negative for environmental allergies.  Neurological:   Negative for dizziness, syncope and headaches.  Hematological:  Negative for adenopathy. Does not bruise/bleed easily.  Psychiatric/Behavioral:  Negative for decreased concentration and dysphoric mood. The patient is nervous/anxious.        Objective:   Physical Exam Constitutional:      General: She is not in acute distress.    Appearance: Normal appearance. She is well-developed and normal weight. She is not ill-appearing or diaphoretic.  HENT:     Head: Normocephalic and atraumatic.     Right Ear: Tympanic membrane, ear canal and external ear normal.     Left Ear: Tympanic membrane, ear canal and external ear normal.     Nose: Nose normal. No congestion.     Mouth/Throat:     Mouth: Mucous membranes are moist.     Pharynx: Oropharynx is clear. No posterior oropharyngeal erythema.  Eyes:     General: No scleral icterus.    Extraocular Movements: Extraocular movements intact.     Conjunctiva/sclera: Conjunctivae normal.     Pupils: Pupils are equal, round, and reactive to light.  Neck:     Thyroid: No thyromegaly.     Vascular: No carotid bruit or JVD.  Cardiovascular:     Rate and Rhythm: Normal rate and regular rhythm.     Pulses: Normal pulses.  Heart sounds: Normal heart sounds.     No gallop.  Pulmonary:     Effort: Pulmonary effort is normal. No respiratory distress.     Breath sounds: Normal breath sounds. No wheezing.     Comments: Good air exch Chest:     Chest wall: No tenderness.  Abdominal:     General: Bowel sounds are normal. There is no distension or abdominal bruit.     Palpations: Abdomen is soft. There is no mass.     Tenderness: There is no abdominal tenderness.     Hernia: No hernia is present.  Genitourinary:    Comments: Breast and pelvic exam are done by gyn provider   Musculoskeletal:        General: No tenderness. Normal range of motion.     Cervical back: Normal range of motion and neck supple. No rigidity. No muscular tenderness.      Right lower leg: No edema.     Left lower leg: No edema.     Comments: No kyphosis   Lymphadenopathy:     Cervical: No cervical adenopathy.  Skin:    General: Skin is warm and dry.     Coloration: Skin is not pale.     Findings: No erythema or rash.     Comments: Solar lentigines diffusely  No acne today  Neurological:     Mental Status: She is alert. Mental status is at baseline.     Cranial Nerves: No cranial nerve deficit.     Motor: No abnormal muscle tone.     Coordination: Coordination normal.     Gait: Gait normal.     Deep Tendon Reflexes: Reflexes are normal and symmetric. Reflexes normal.  Psychiatric:        Attention and Perception: Attention normal.        Mood and Affect: Mood normal.        Cognition and Memory: Cognition and memory normal.     Comments: Candidly discusses symptoms and stressors             Assessment & Plan:   Problem List Items Addressed This Visit       Cardiovascular and Mediastinum   Essential hypertension    BP: 126/86  Good lifestyle habits Taking aldactone 50 mg bid for acne and hirsuitism as well No longer needs arb   Encouraged to limit caffeine and etoh  Continue exercise         Musculoskeletal and Integument   Acne vulgaris    Continues spironolactone for this and hirsuitism  It has been helpful  Not on OC currently       Hirsutism    Continues aldactone -helpful  High DHEA  Has endocrionlogy appt in feb        Other   Anxiety disorder    Treatment of ADD has helped - adderall xr 20 mg daily  May soon start lower short acting dose for late day  Tolerating well  Under care of psychiatry  Continues viibryd 40 mg daily  Also helpful  Reviewed stressors/ coping techniques/symptoms/ support sources/ tx options and side effects in detail today Suggest meditation and exercise  Also limit etoh intake       Arthralgia    Positive RA Some features or sjogren's  Has rheum appt in feb  No acute joint  changes today        Dry eye    Dry eyes followed a bout of conjunctivitis  Dry mouth also  ?  If possible sjogren's  Has positive RF  Has appt with rheumatology in feb      Elevated DHEA    Has endocrinology follow up in feb Has hirsuit symptoms Also under care of gyn  Reassuring adrenal appearance on CT      Elevated rheumatoid factor    Rheum appt upcoming in feb Some arthralgias and dry mucous membranes       Routine general medical examination at a health care facility - Primary    Reviewed health habits including diet and exercise and skin cancer prevention Reviewed appropriate screening tests for age  Also reviewed health mt list, fam hx and immunization status , as well as social and family history   See HPI Labs reviewed and ordered Continues gyn follow up  Plans flu vaccine in fall  Has appt with genetics at [facility] in light of breast cancer risk Pap normal 12/2020 with 5 y recall  Discussed bone health/ vit D intake and exercise PHQ 1        Relevant Orders   Lipid panel (Completed)

## 2023-03-02 NOTE — Assessment & Plan Note (Signed)
Treatment of ADD has helped - adderall xr 20 mg daily  May soon start lower short acting dose for late day  Tolerating well  Under care of psychiatry  Continues viibryd 40 mg daily  Also helpful  Reviewed stressors/ coping techniques/symptoms/ support sources/ tx options and side effects in detail today Suggest meditation and exercise  Also limit etoh intake

## 2023-03-02 NOTE — Assessment & Plan Note (Signed)
Continues spironolactone for this and hirsuitism  It has been helpful  Not on OC currently

## 2023-03-02 NOTE — Assessment & Plan Note (Signed)
Continues aldactone -helpful  High DHEA  Has endocrionlogy appt in feb

## 2023-03-02 NOTE — Assessment & Plan Note (Signed)
Reviewed health habits including diet and exercise and skin cancer prevention Reviewed appropriate screening tests for age  Also reviewed health mt list, fam hx and immunization status , as well as social and family history   See HPI Labs reviewed and ordered Continues gyn follow up  Plans flu vaccine in fall  Has appt with genetics at [facility] in light of breast cancer risk Pap normal 12/2020 with 5 y recall  Discussed bone health/ vit D intake and exercise PHQ 1

## 2023-03-02 NOTE — Assessment & Plan Note (Signed)
Rheum appt upcoming in feb Some arthralgias and dry mucous membranes

## 2023-03-02 NOTE — Patient Instructions (Addendum)
Get your flu shot in the fall   Go for your yearly gyn visit  Talk to them about the pcos concerns   Add some strength training to your routine, this is important for bone and brain health and can reduce your risk of falls and help your body use insulin properly and regulate weight  Light weights, exercise bands , and internet videos are a good way to start  Yoga (chair or regular), machines , floor exercises or a gym with machines are also good options   Keep using sun protection   Labs for cholesterol today   Think about meditation Try to limit wine to 5 oz        Mediterranean Diet  Why follow it? Research shows. Those who follow the Mediterranean diet have a reduced risk of heart disease  The diet is associated with a reduced incidence of Parkinson's and Alzheimer's diseases People following the diet may have longer life expectancies and lower rates of chronic diseases  The Dietary Guidelines for Americans recommends the Mediterranean diet as an eating plan to promote health and prevent disease  What Is the Mediterranean Diet?  Healthy eating plan based on typical foods and recipes of Mediterranean-style cooking The diet is primarily a plant based diet; these foods should make up a majority of meals   Starches - Plant based foods should make up a majority of meals - They are an important sources of vitamins, minerals, energy, antioxidants, and fiber - Choose whole grains, foods high in fiber and minimally processed items  - Typical grain sources include wheat, oats, barley, corn, brown rice, bulgar, farro, millet, polenta, couscous  - Various types of beans include chickpeas, lentils, fava beans, black beans, white beans   Fruits  Veggies - Large quantities of antioxidant rich fruits & veggies; 6 or more servings  - Vegetables can be eaten raw or lightly drizzled with oil and cooked  - Vegetables common to the traditional Mediterranean Diet include: artichokes, arugula, beets,  broccoli, brussel sprouts, cabbage, carrots, celery, collard greens, cucumbers, eggplant, kale, leeks, lemons, lettuce, mushrooms, okra, onions, peas, peppers, potatoes, pumpkin, radishes, rutabaga, shallots, spinach, sweet potatoes, turnips, zucchini - Fruits common to the Mediterranean Diet include: apples, apricots, avocados, cherries, clementines, dates, figs, grapefruits, grapes, melons, nectarines, oranges, peaches, pears, pomegranates, strawberries, tangerines  Fats - Replace butter and margarine with healthy oils, such as olive oil, canola oil, and tahini  - Limit nuts to no more than a handful a day  - Nuts include walnuts, almonds, pecans, pistachios, pine nuts  - Limit or avoid candied, honey roasted or heavily salted nuts - Olives are central to the [employer] - can be eaten whole or used in a variety of dishes   Meats Protein - Limiting red meat: no more than a few times a month - When eating red meat: choose lean cuts and keep the portion to the size of deck of cards - Eggs: approx. 0 to 4 times a week  - Fish and lean poultry: at least 2 a week  - Healthy protein sources include, chicken, [place], lean beef, lamb - Increase intake of seafood such as tuna, salmon, trout, mackerel, shrimp, scallops - Avoid or limit high fat processed meats such as sausage and bacon  Dairy - Include moderate amounts of low fat dairy products  - Focus on healthy dairy such as fat free yogurt, skim milk, low or reduced fat cheese - Limit dairy products higher in fat such as  whole or 2% milk, cheese, ice cream  Alcohol - Moderate amounts of red wine is ok  - No more than 5 oz daily for women (all ages) and men older than age 39  - No more than 10 oz of wine daily for men younger than 12  Other - Limit sweets and other desserts  - Use herbs and spices instead of salt to flavor foods  - Herbs and spices common to the traditional Mediterranean Diet include: basil, bay leaves, chives, cloves, cumin,  fennel, garlic, lavender, marjoram, mint, oregano, parsley, pepper, rosemary, sage, savory, sumac, tarragon, thyme   It's not just a diet, it's a lifestyle:  The Mediterranean diet includes lifestyle factors typical of those in the region  Foods, drinks and meals are best eaten with others and savored Daily physical activity is important for overall good health This could be strenuous exercise like running and aerobics This could also be more leisurely activities such as walking, housework, yard-work, or taking the stairs Moderation is the key; a balanced and healthy diet accommodates most foods and drinks Consider portion sizes and frequency of consumption of certain foods   Meal Ideas & Options:  Breakfast:  Whole wheat toast or whole wheat English muffins with peanut butter & hard boiled egg Steel cut oats topped with apples & cinnamon and skim milk  Fresh fruit: banana, strawberries, melon, berries, peaches  Smoothies: strawberries, bananas, greek yogurt, peanut butter Low fat greek yogurt with blueberries and granola  Egg white omelet with spinach and mushrooms Breakfast couscous: whole wheat couscous, apricots, skim milk, cranberries  Sandwiches:  Hummus and grilled vegetables (peppers, zucchini, squash) on whole wheat bread   Grilled chicken on whole wheat pita with lettuce, tomatoes, cucumbers or tzatziki  [place] salad on whole wheat bread: tuna salad made with greek yogurt, olives, red peppers, capers, green onions Garlic rosemary lamb pita: lamb sauted with garlic, rosemary, salt & pepper; add lettuce, cucumber, greek yogurt to pita - flavor with lemon juice and black pepper  Seafood:  Mediterranean grilled salmon, seasoned with garlic, basil, parsley, lemon juice and black pepper Shrimp, lemon, and spinach whole-grain pasta salad made with low fat greek yogurt  Seared scallops with lemon orzo  Seared tuna steaks seasoned salt, pepper, coriander topped with tomato mixture of  olives, tomatoes, olive oil, minced garlic, parsley, green onions and cappers  Meats:  Herbed greek chicken salad with kalamata olives, cucumber, feta  Red bell peppers stuffed with spinach, bulgur, lean ground beef (or lentils) & topped with feta   Kebabs: skewers of chicken, tomatoes, onions, zucchini, squash  [place] burgers: made with red onions, mint, dill, lemon juice, feta cheese topped with roasted red peppers Vegetarian Cucumber salad: cucumbers, artichoke hearts, celery, red onion, feta cheese, tossed in olive oil & lemon juice  Hummus and whole grain pita points with a greek salad (lettuce, tomato, feta, olives, cucumbers, red onion) Lentil soup with celery, carrots made with vegetable broth, garlic, salt and pepper  Tabouli salad: parsley, bulgur, mint, scallions, cucumbers, tomato, radishes, lemon juice, olive oil, salt and pepper.

## 2023-03-02 NOTE — Assessment & Plan Note (Signed)
Has endocrinology follow up in feb Has hirsuit symptoms Also under care of gyn  Reassuring adrenal appearance on CT

## 2023-03-02 NOTE — Assessment & Plan Note (Signed)
BP: 126/86  Good lifestyle habits Taking aldactone 50 mg bid for acne and hirsuitism as well No longer needs arb   Encouraged to limit caffeine and etoh  Continue exercise

## 2023-03-08 ENCOUNTER — Ambulatory Visit: Payer: BC Managed Care – PPO | Admitting: Obstetrics and Gynecology

## 2023-03-08 ENCOUNTER — Encounter: Payer: Self-pay | Admitting: Obstetrics and Gynecology

## 2023-03-08 VITALS — BP 142/95 | HR 62 | Ht 65.0 in | Wt 126.2 lb

## 2023-03-08 DIAGNOSIS — Z01419 Encounter for gynecological examination (general) (routine) without abnormal findings: Secondary | ICD-10-CM

## 2023-03-08 DIAGNOSIS — E282 Polycystic ovarian syndrome: Secondary | ICD-10-CM | POA: Insufficient documentation

## 2023-03-08 DIAGNOSIS — F902 Attention-deficit hyperactivity disorder, combined type: Secondary | ICD-10-CM | POA: Diagnosis not present

## 2023-03-08 DIAGNOSIS — F411 Generalized anxiety disorder: Secondary | ICD-10-CM | POA: Diagnosis not present

## 2023-03-08 DIAGNOSIS — R7989 Other specified abnormal findings of blood chemistry: Secondary | ICD-10-CM | POA: Diagnosis not present

## 2023-03-08 MED ORDER — DROSPIRENONE-ETHINYL ESTRADIOL 3-0.02 MG PO TABS: 1.0000 | ORAL_TABLET | Freq: Every day | ORAL | 3 refills | Status: DC

## 2023-03-08 NOTE — Progress Notes (Signed)
ANNUAL EXAM Patient name: Tamara Mills MRN [id number]  Date of birth: Jan 02, 1991 Chief Complaint:   Annual Exam  History of Present Illness:   Tamara Mills is a 32 y.o. [phone] female being seen today for a routine annual exam.   Current concerns: She was sent for evaluation with endocrine for symptoms of hyperandrogenism and elevated DHEAS. She had a CT which is negative. She has been on Spironolactone which is helping her symptoms but she feels like it would be helpful to do Yaz - she has done well on this in the past.  The endocrinologist has presumed PCOS since adrenal mass has been ruled out.   Current birth control: Vasectomy.   Patient's last menstrual period was 03/02/2023.   Last Pap/Pap History: 12/2020. Results were: NILM w/ HRHPV negative. H/O abnormal pap: no   Health Maintenance Due  Topic Date Due   Hepatitis C Screening  Never done    Review of Systems:   Pertinent items are noted in HPI Denies any headaches, blurred vision, fatigue, shortness of breath, chest pain, abdominal pain, abnormal vaginal discharge/itching/odor/irritation, problems with periods, bowel movements, urination, or intercourse unless otherwise stated above.  Pertinent History Reviewed:  Reviewed past medical,surgical, social and family history.  Reviewed problem list, medications and allergies. Physical Assessment:   Vitals:   03/08/23 1008  BP: (!) 142/95  Pulse: 62  Weight: 126 lb 3.2 oz (57.2 kg)  Height: 5\' 5"  (1.651 m)  Body mass index is 21 kg/m.   Physical Examination:  General appearance - well appearing, and in no distress Mental status - alert, oriented to person, place, and time Psych:  She has a normal mood and affect Skin - warm and dry, normal color, no suspicious lesions noted Chest - effort normal Heart - normal rate  Breasts - breasts appear normal, no suspicious masses, no skin or nipple changes or axillary nodes Abdomen - soft, nontender, nondistended, no masses or  organomegaly Pelvic -  Not examined, pt declines, no symptoms.  Extremities:  No swelling or varicosities noted  Chaperone present for exam  No results found for this or any previous visit (from the past 24 hour(s)).  Assessment & Plan:  [name] was seen today for annual exam.  Diagnoses and all orders for this visit:  Encounter for annual routine gynecological examination - Cervical cancer screening: Discussed guidelines. Pap with HPV up to date 12/2020.  - GC/CT: not indicated - Birth Control:  Vasectomy - Breast Health: Encouraged self breast awareness/SBE. Teaching provided.  - F/U 12 months and prn - We also discussed maternal history of early menopause - she will consider FMR1 testing. She had genetic testing for [facility] of breast cancer and is unsure if it was tested on there but she will check. She is negative for breast cancer gene mutations.  -     US PELVIC COMPLETE WITH TRANSVAGINAL; Future  PCOS (polycystic ovarian syndrome) Will restart Yaz for noncontraceptive benefits.  She is recent with her FLP and A1C - both normal.  -     drospirenone-ethinyl estradiol (YAZ) 3-0.02 MG tablet; Take 1 tablet by mouth daily. -     US PELVIC COMPLETE WITH TRANSVAGINAL; Future  Elevated DHEA Check pelvic US to ensure normal ovaries -     US PELVIC COMPLETE WITH TRANSVAGINAL; Future   Orders Placed This Encounter  Procedures   US PELVIC COMPLETE WITH TRANSVAGINAL    Meds:  Meds ordered this encounter  Medications   drospirenone-ethinyl  estradiol (YAZ) 3-0.02 MG tablet    Sig: Take 1 tablet by mouth daily.    Dispense:  84 tablet    Refill:  3    Follow-up: No follow-ups on file.  Tamara Hock, MD 03/08/2023 10:46 AM

## 2023-03-08 NOTE — Progress Notes (Signed)
Patient presents for Annual.  LMP: 03/02/23  Last pap: Date: 12/25/20-WNL Contraception:  Husband had Vasectomy  Mammogram: Not yet indicated STD Screening: not indicated-Declines STD blood work   Flu Vaccine : N/A  CC:  Annual  Denies any concerns

## 2023-03-08 NOTE — Patient Instructions (Addendum)
Fragile X (FMR1) genetic carrier - early menopause

## 2023-03-11 ENCOUNTER — Ambulatory Visit
Admission: RE | Admit: 2023-03-11 | Discharge: 2023-03-11 | Disposition: A | Discharge status: Home / Self Care | Payer: BC Managed Care – PPO | Source: Ambulatory Visit | Attending: Obstetrics and Gynecology | Admitting: Obstetrics and Gynecology

## 2023-03-11 DIAGNOSIS — R7989 Other specified abnormal findings of blood chemistry: Secondary | ICD-10-CM | POA: Diagnosis not present

## 2023-03-11 DIAGNOSIS — Z01419 Encounter for gynecological examination (general) (routine) without abnormal findings: Secondary | ICD-10-CM | POA: Diagnosis not present

## 2023-03-11 DIAGNOSIS — E282 Polycystic ovarian syndrome: Secondary | ICD-10-CM

## 2023-03-11 DIAGNOSIS — N83202 Unspecified ovarian cyst, left side: Secondary | ICD-10-CM | POA: Diagnosis not present

## 2023-03-15 ENCOUNTER — Ambulatory Visit
Admission: RE | Admit: 2023-03-15 | Discharge: 2023-03-15 | Disposition: A | Discharge status: Home / Self Care | Payer: BC Managed Care – PPO | Source: Ambulatory Visit | Attending: Emergency Medicine | Admitting: Emergency Medicine

## 2023-03-15 VITALS — BP 126/84 | HR 77 | Temp 98.0°F | Resp 15

## 2023-03-15 DIAGNOSIS — Z3202 Encounter for pregnancy test, result negative: Secondary | ICD-10-CM | POA: Diagnosis not present

## 2023-03-15 DIAGNOSIS — N3000 Acute cystitis without hematuria: Secondary | ICD-10-CM | POA: Diagnosis not present

## 2023-03-15 LAB — POCT URINALYSIS DIP (MANUAL ENTRY)
Bilirubin, UA: NEGATIVE
Glucose, UA: NEGATIVE mg/dL
Ketones, POC UA: NEGATIVE mg/dL
Nitrite, UA: NEGATIVE
Protein Ur, POC: 30 mg/dL — AB
Spec Grav, UA: 1.02 (ref 1.010–1.025)
Urobilinogen, UA: 0.2 U/dL
pH, UA: 7 (ref 5.0–8.0)

## 2023-03-15 LAB — POCT URINE PREGNANCY: Preg Test, Ur: NEGATIVE

## 2023-03-15 MED ORDER — CEPHALEXIN 500 MG PO CAPS: 500.0000 mg | ORAL_CAPSULE | Freq: Two times a day (BID) | ORAL | 0 refills | Status: AC

## 2023-03-15 NOTE — ED Triage Notes (Signed)
Patient to Urgent Care with complaints of urinary frequency, dysuria, and suprapubic pressure that started approx 24 hours ago.   Reports completing an at home urine dipstick was positive for a UTI.  Denies any fevers or vaginal discharge.

## 2023-03-15 NOTE — Discharge Instructions (Addendum)
Take the antibiotic as directed.  The urine culture is pending.  We will call you if it shows the need to change or discontinue your antibiotic.    Follow up with your primary care provider if your symptoms are not improving.    

## 2023-03-15 NOTE — ED Provider Notes (Signed)
Renaldo Fiddler    CSN: [id number] Arrival date & time: 03/15/23  0825      History   Chief Complaint Chief Complaint  Patient presents with   Urinary Frequency    UTI symptoms - urinary frequency/burning, abdominal/pelvic pressure,  positive dipstick at home OTC test - Entered by patient    HPI Tamara Mills is a 32 y.o. female.  Patient presents with 1 day history of dysuria, urinary frequency, bladder pressure, urinary urgency.  She denies fever, chills, abdominal pain, flank pain, vaginal discharge, pelvic pain, or other symptoms.  No OTC medications at home.  The history is provided by the patient and medical records.    Past Medical History:  Diagnosis Date   Acne    Allergy    Amenorrhea    Hx of   Anxiety    COVID-19    Eating disorder    ? eating disorder in past ( pt states just running too much)   Hand eczema    History of gestational hypertension 11/30/2019   Hypertension    Nephrotic syndrome 09/08/2020   After pregnancy   Seasonal allergies    Underweight    Hx of    Patient Active Problem List   Diagnosis Date Noted   PCOS (polycystic ovarian syndrome) 03/08/2023   Elevated rheumatoid factor 02/02/2023   Arthralgia 02/01/2023   Dry eye 02/01/2023   Elevated DHEA 01/17/2023   Hirsutism 01/17/2023   Alopecia areata 11/18/2021   Family history of autoimmune disorder 09/04/2020   Essential hypertension 11/04/2017   Family history of sudden cardiac death 2016/07/14   Routine general medical examination at a health care facility 08/29/2013   Acne vulgaris 04/28/2012   Anxiety disorder 02/28/2007   DEFORMITY, CAVUS, FOOT, ACQUIRED 02/23/2007    Past Surgical History:  Procedure Laterality Date   WISDOM TOOTH EXTRACTION      OB History     Gravida  2   Para  2   Term  2   Preterm      AB      Living  2      SAB      IAB      Ectopic      Multiple  0   Live Births  2            Home Medications    Prior to  Admission medications   Medication Sig Start Date End Date Taking? Authorizing Provider  amphetamine-dextroamphetamine (ADDERALL) 10 MG tablet Take 5-10 mg by mouth daily as needed. 03/08/23  Yes [provider]  busPIRone (BUSPAR) 10 MG tablet Take 10-20 mg by mouth 2 (two) times daily as needed. 03/08/23  Yes [provider]  cephALEXin (KEFLEX) 500 MG capsule Take 1 capsule (500 mg total) by mouth 2 (two) times daily for 5 days. 03/15/23 03/20/23 Yes Mickie Bail, NP  ADDERALL XR 20 MG 24 hr capsule Take 20 mg by mouth every morning. 01/18/23   [provider]  cyanocobalamin (VITAMIN B12) 1000 MCG tablet Take 1,000 mcg by mouth daily.    [provider]  drospirenone-ethinyl estradiol (YAZ) 3-0.02 MG tablet Take 1 tablet by mouth daily. 03/08/23   Milas Hock, MD  Polyethyl Glycol-Propyl Glycol (SYSTANE) 0.4-0.3 % SOLN Apply 1 drop to eye in the morning, at noon, and at bedtime.    [provider]  spironolactone (ALDACTONE) 50 MG tablet Take 1 tablet (50 mg total) by mouth 2 (two) times daily.  01/12/23   McAdoo Bing, MD  Vilazodone HCl (VIIBRYD) 40 MG TABS TAKE 1 TABLET BY MOUTH EVERY DAY 05/25/22   Tower, Audrie Gallus, MD    Family History Family History  Problem Relation Age of Onset   Crohn's disease Brother    Ulcerative colitis Brother    Sudden Cardiac Death Brother    Hypertension Mother        had also preeclampsia   Diabetes Maternal Grandmother        Type 1   Cancer Paternal Grandmother 22       breast   Breast cancer Paternal Grandmother    Heart failure Paternal Grandfather        heart disease   Cancer Paternal Aunt 7       breast   Breast cancer Paternal Aunt    Ovarian cancer Neg Hx    Colon cancer Neg Hx    Esophageal cancer Neg Hx    Rectal cancer Neg Hx    Stomach cancer Neg Hx     Social History Social History   Tobacco Use   Smoking status: Never   Smokeless tobacco: Never  Vaping Use   Vaping status: Never  Used  Substance Use Topics   Alcohol use: Yes    Comment: occasional   Drug use: No     Allergies   Ortho tri-cyclen [norgestimate-eth estradiol] and Nickel   Review of Systems Review of Systems  Constitutional:  Negative for chills and fever.  Gastrointestinal:  Negative for abdominal pain, diarrhea, nausea and vomiting.  Genitourinary:  Positive for dysuria, frequency and urgency. Negative for flank pain, hematuria, pelvic pain and vaginal discharge.     Physical Exam Triage Vital Signs ED Triage Vitals  Encounter Vitals Group     BP 03/15/23 0844 126/84     Systolic BP Percentile --      Diastolic BP Percentile --      Pulse Rate 03/15/23 0846 77     Resp 03/15/23 0846 15     Temp 03/15/23 0846 98 F (36.7 C)     Temp src --      SpO2 03/15/23 0846 98 %     Weight --      Height --      Head Circumference --      Peak Flow --      Pain Score 03/15/23 0843 0     Pain Loc --      Pain Education --      Exclude from Growth Chart --    No data found.  Updated Vital Signs BP 126/84   Pulse 77   Temp 98 F (36.7 C)   Resp 15   LMP 03/02/2023   SpO2 98%   Visual Acuity Right Eye Distance:   Left Eye Distance:   Bilateral Distance:    Right Eye Near:   Left Eye Near:    Bilateral Near:     Physical Exam Vitals and nursing note reviewed.  Constitutional:      General: She is not in acute distress.    Appearance: She is well-developed.  HENT:     Mouth/Throat:     Mouth: Mucous membranes are moist.  Cardiovascular:     Rate and Rhythm: Normal rate and regular rhythm.     Heart sounds: Normal heart sounds.  Pulmonary:     Effort: Pulmonary effort is normal. No respiratory distress.     Breath sounds: Normal breath sounds.  Abdominal:  General: Bowel sounds are normal.     Palpations: Abdomen is soft.     Tenderness: There is no abdominal tenderness. There is no right CVA tenderness, left CVA tenderness, guarding or rebound.  Musculoskeletal:      Cervical back: Neck supple.  Skin:    General: Skin is warm and dry.  Neurological:     Mental Status: She is alert.  Psychiatric:        Mood and Affect: Mood normal.        Behavior: Behavior normal.      UC Treatments / Results  Labs (all labs ordered are listed, but only abnormal results are displayed) Labs Reviewed  POCT URINALYSIS DIP (MANUAL ENTRY) - Abnormal; Notable for the following components:      Result Value   Clarity, UA cloudy (*)    Blood, UA moderate (*)    Protein Ur, POC =30 (*)    Leukocytes, UA Moderate (2+) (*)    All other components within normal limits  URINE CULTURE  POCT URINE PREGNANCY    EKG   Radiology No results found.  Procedures Procedures (including critical care time)  Medications Ordered in UC Medications - No data to display  Initial Impression / Assessment and Plan / UC Course  I have reviewed the triage vital signs and the nursing notes.  Pertinent labs & imaging results that were available during my care of the patient were reviewed by me and considered in my medical decision making (see chart for details).    Acute cystitis without hematuria, negative pregnancy test.  Treating with Keflex. Urine culture pending. Discussed with patient that we will call her if the urine culture shows the need to change or discontinue the antibiotic. Instructed her to follow-up with her PCP if her symptoms are not improving. Patient agrees to plan of care.     Final Clinical Impressions(s) / UC Diagnoses   Final diagnoses:  Acute cystitis without hematuria  Negative pregnancy test     Discharge Instructions      Take the antibiotic as directed.  The urine culture is pending.  We will call you if it shows the need to change or discontinue your antibiotic.    Follow up with your primary care provider if your symptoms are not improving.        ED Prescriptions     Medication Sig Dispense Auth. Provider   cephALEXin (KEFLEX)  500 MG capsule Take 1 capsule (500 mg total) by mouth 2 (two) times daily for 5 days. 10 capsule Mickie Bail, NP      PDMP not reviewed this encounter.   Mickie Bail, NP 03/15/23 [phone]

## 2023-03-15 NOTE — Addendum Note (Signed)
Addended by: Milas Hock A on: 03/15/2023 02:18 PM   Modules accepted: Orders

## 2023-03-17 LAB — URINE CULTURE: Culture: 20000 — AB

## 2023-04-09 ENCOUNTER — Other Ambulatory Visit: Payer: Self-pay | Admitting: Obstetrics and Gynecology

## 2023-04-12 DIAGNOSIS — F902 Attention-deficit hyperactivity disorder, combined type: Secondary | ICD-10-CM | POA: Diagnosis not present

## 2023-04-12 DIAGNOSIS — F411 Generalized anxiety disorder: Secondary | ICD-10-CM | POA: Diagnosis not present

## 2023-04-28 DIAGNOSIS — L578 Other skin changes due to chronic exposure to nonionizing radiation: Secondary | ICD-10-CM | POA: Diagnosis not present

## 2023-04-28 DIAGNOSIS — L308 Other specified dermatitis: Secondary | ICD-10-CM | POA: Diagnosis not present

## 2023-04-28 DIAGNOSIS — L638 Other alopecia areata: Secondary | ICD-10-CM | POA: Diagnosis not present

## 2023-04-28 DIAGNOSIS — D2239 Melanocytic nevi of other parts of face: Secondary | ICD-10-CM | POA: Diagnosis not present

## 2023-05-20 ENCOUNTER — Encounter: Payer: Self-pay | Admitting: Family Medicine

## 2023-05-30 ENCOUNTER — Ambulatory Visit
Admission: RE | Admit: 2023-05-30 | Discharge: 2023-05-30 | Disposition: A | Discharge status: Home / Self Care | Payer: BC Managed Care – PPO | Source: Ambulatory Visit | Attending: Obstetrics and Gynecology | Admitting: Obstetrics and Gynecology

## 2023-05-30 DIAGNOSIS — E282 Polycystic ovarian syndrome: Secondary | ICD-10-CM | POA: Diagnosis not present

## 2023-05-30 DIAGNOSIS — N854 Malposition of uterus: Secondary | ICD-10-CM | POA: Diagnosis not present

## 2023-06-07 ENCOUNTER — Ambulatory Visit: Payer: BC Managed Care – PPO

## 2023-06-30 NOTE — Progress Notes (Signed)
 Office Visit Note  Patient: Tamara Mills             Date of Birth: April 08, 1991           MRN: [id number]             PCP: Randeen Laine LABOR, MD Referring: Tower, Laine LABOR, MD Visit Date: 07/14/2023 Occupation: @GUAROCC @  Subjective:  Stiffness in hands, dry mouth and dry eyes  History of Present Illness: Tamara Mills is a 32 y.o. female seen in consultation per request of her PCP.  According the patient her symptoms started 1 year ago after viral conjunctivitis.  She started noticing dry eyes.  She was seen by her ophthalmologist to mention decreased tear production.  She states her serology was negative except for positive rheumatoid factor.  She has been using Systane over-the-counter eyedrops which helped.  She has mild dry mouth symptoms which are manageable with drinking water and chewing gum.  She states since she was a teenager she has been noticing some stiffness in her hands especially when she is in the colder temperatures.  She has not noticed any typical changes of Raynauds.  She has also suffered from eczema and alopecia areata flares.  She has been followed by dermatologist at [facility] dermatology.  She does not have any current episodes of alopecia areata.  She states she developed hypertension after her first pregnancy at age 69 which resolved after few months.  And her second pregnancy was at age 73.  She states she was induced early because of her previous history.  She states that 2 months postpartum she developed pedal edema and proteinuria.  She was evaluated by nephrologist and had a renal biopsy.  She was diagnosed with minimal-change disease.  The etiology was unknown and was questionable preeclampsia.  She was given oral prednisone for 6 months along with CellCept.  She states she was on CellCept from March 2022 until November 2022.  After that she was discharged from nephrology clinic and follow-up with her PCP.  She had no recurrence of renal symptoms.  She continues to have a  stiffness in her hands.  None of the other joints are painful.  She also continues to have dry eyes and dry mouth.  She is gravida 2, para 2.  There is no history of preeclampsia.  There is no history of DVTs.  Her brother has Crohn's disease.  History of Addison's disease in maternal aunt and type 1 diabetes in maternal grandmother.    Activities of Daily Living:  Patient reports morning stiffness for 0 minute.   Patient Denies nocturnal pain.  Difficulty dressing/grooming: Denies Difficulty climbing stairs: Denies Difficulty getting out of chair: Denies Difficulty using hands for taps, buttons, cutlery, and/or writing: Reports  Review of Systems  Constitutional:  Negative for fatigue.  HENT:  Positive for mouth dryness. Negative for mouth sores.   Eyes:  Positive for dryness.  Respiratory:  Negative for shortness of breath.   Cardiovascular:  Negative for chest pain and palpitations.  Gastrointestinal:  Negative for blood in stool, constipation and diarrhea.  Endocrine: Negative for increased urination.  Genitourinary:  Negative for involuntary urination.  Musculoskeletal:  Positive for joint pain and joint pain. Negative for gait problem, joint swelling, myalgias, muscle weakness, morning stiffness, muscle tenderness and myalgias.  Skin:  Positive for rash and hair loss. Negative for color change and sensitivity to sunlight.  Allergic/Immunologic: Negative for susceptible to infections.  Neurological:  Negative for dizziness  and headaches.  Hematological:  Negative for swollen glands.  Psychiatric/Behavioral:  Negative for depressed mood and sleep disturbance. The patient is nervous/anxious.     PMFS History:  Patient Active Problem List   Diagnosis Date Noted   PCOS (polycystic ovarian syndrome) 03/08/2023   Elevated rheumatoid factor 02/02/2023   Arthralgia 02/01/2023   Dry eye 02/01/2023   Elevated DHEA 01/17/2023   Hirsutism 01/17/2023   Alopecia areata 11/18/2021    Family history of autoimmune disorder 09/04/2020   Essential hypertension 11/04/2017   Family history of sudden cardiac death 07/10/16   Routine general medical examination at a health care facility 08/29/2013   Acne vulgaris 04/28/2012   Anxiety disorder 02/28/2007   DEFORMITY, CAVUS, FOOT, ACQUIRED 02/23/2007    Past Medical History:  Diagnosis Date   Acne    Allergy    Amenorrhea    Hx of   Anxiety    COVID-19    Eating disorder    ? eating disorder in past ( pt states just running too much)   Hand eczema    History of gestational hypertension 11/30/2019   Hypertension    Nephrotic syndrome 09/08/2020   After pregnancy   Seasonal allergies    Underweight    Hx of    Family History  Problem Relation Age of Onset   Hypertension Mother        had also preeclampsia   Crohn's disease Brother    Ulcerative colitis Brother    Sudden Cardiac Death Brother    Diabetes Maternal Grandmother        Type 1   Cancer Paternal Grandmother 17       breast   Breast cancer Paternal Grandmother    Heart failure Paternal Grandfather        heart disease   Cancer Paternal Aunt 26       breast   Breast cancer Paternal Aunt    Ovarian cancer Neg Hx    Colon cancer Neg Hx    Esophageal cancer Neg Hx    Rectal cancer Neg Hx    Stomach cancer Neg Hx    Past Surgical History:  Procedure Laterality Date   WISDOM TOOTH EXTRACTION     Social History   Social History Narrative   Not on file   Immunization History  Administered Date(s) Administered   Influenza Split 04/22/2011, 03/31/2012   Influenza Whole 04/24/2007, 04/25/2009, 04/03/2010   Influenza,inj,Quad PF,6+ Mos 03/09/2013, 04/12/2014, 04/10/2015, 04/30/2016, 04/12/2018, 04/10/2020, 03/27/2021   Influenza-Unspecified 04/14/2017, 04/12/2019   Moderna Sars-Covid-2 Vaccination 07/16/2019, 08/13/2019   PPD Test 09/20/2011, 09/22/2012, 08/29/2013, 10/14/2017   Td 08/12/2002   Tdap 09/22/2012, 06/24/2017, 04/10/2020      Objective: Vital Signs: BP 136/84 (BP Location: Right Arm, Patient Position: Sitting, Cuff Size: Normal)   Pulse 69   Resp 14   Ht 5' 6.25 (1.683 m)   Wt 128 lb (58.1 kg)   LMP 06/15/2023   BMI 20.50 kg/m    Physical Exam Vitals and nursing note reviewed.  Constitutional:      Appearance: She is well-developed.  HENT:     Head: Normocephalic and atraumatic.  Eyes:     Conjunctiva/sclera: Conjunctivae normal.  Cardiovascular:     Rate and Rhythm: Normal rate and regular rhythm.     Heart sounds: Normal heart sounds.  Pulmonary:     Effort: Pulmonary effort is normal.     Breath sounds: Normal breath sounds.  Abdominal:     General: Bowel  sounds are normal.     Palpations: Abdomen is soft.  Musculoskeletal:     Cervical back: Normal range of motion.  Lymphadenopathy:     Cervical: No cervical adenopathy.  Skin:    General: Skin is warm and dry.     Capillary Refill: Capillary refill takes less than 2 seconds.     Comments: No nailbed capillary changes was noted.  She had good capillary refill.  Neurological:     Mental Status: She is alert and oriented to person, place, and time.  Psychiatric:        Behavior: Behavior normal.      Musculoskeletal Exam: Cervical, thoracic or lumbar spine 1 good range of motion.  Shoulder joints, elbow joints, wrist joints, MCPs PIPs and DIPs with good range of motion with no synovitis.  Hip joints, knee joints, ankles, MTPs and PIPs were in good range of motion with no synovitis.  Bilateral pes cavus was noted.  CDAI Exam: CDAI Score: -- Patient Global: --; Provider Global: -- Swollen: --; Tender: -- Joint Exam 07/14/2023   No joint exam has been documented for this visit   There is currently no information documented on the homunculus. Go to the Rheumatology activity and complete the homunculus joint exam.  Investigation: No additional findings.  Imaging: No results found.  Recent Labs: Lab Results  Component Value  Date   WBC 5.0 02/01/2023   HGB 13.6 02/01/2023   PLT 279.0 02/01/2023   NA 139 01/12/2023   K 4.3 01/12/2023   CL 103 01/12/2023   CO2 20 01/12/2023   GLUCOSE 86 01/12/2023   BUN 10 01/12/2023   CREATININE 0.70 01/12/2023   BILITOT 0.9 01/12/2023   ALKPHOS 69 01/12/2023   AST 16 01/12/2023   ALT 14 01/12/2023   PROT 6.7 01/12/2023   ALBUMIN 4.8 01/12/2023   CALCIUM 9.6 01/12/2023   GFRAA 146 04/10/2020   February 01, 2023 vitamin D  67, ANA negative, SSA negative, SSB negative, anti-CCP negative, RF 70, ESR 4, CRP<1.0  Speciality Comments: No specialty comments available.  Procedures:  No procedures performed Allergies: Ortho tri-cyclen [norgestimate-eth estradiol ] and Nickel   Assessment / Plan:     Visit Diagnoses: Elevated rheumatoid factor -patient has positive rheumatoid factor and sicca symptoms.  No synovitis was noted on the examination.  Plan: Sedimentation rate  Pain in both hands -she complains of a stiffness in her hands when exposed to the colder temperatures.  She has not noticed any joint swelling.  No synovitis was noted.  She denies any symptoms of Raynaud's phenomenon.  She is concerned about underlying autoimmune disease.  Plan: XR Hand 2 View Right, XR Hand 2 View Left, x-rays of bilateral hands were unremarkable.  I will schedule ultrasound of bilateral hands to look for synovitis.  ANA, Anti-scleroderma antibody, RNP Antibody, Anti-Smith antibody, Sjogrens syndrome-A extractable nuclear antibody, Sjogrens syndrome-B extractable nuclear antibody, Anti-DNA antibody, double-stranded, C3 and C4, Beta-2  glycoprotein antibodies, Cardiolipin antibodies, IgG, IgM, IgA, Rheumatoid factor, Cyclic citrul peptide antibody, IgG  Dry eye-she complains of dry eyes and dry mouth.  She uses contact lenses.  She has been using Systane eyedrops.  She was evaluated by an ophthalmologist and was told that she has decreased tear production.  She also gives history of dry mouth.  She has  been using chewing gum and also drinks water frequently.  She denies any history of shortness of breath.  Other fatigue -she complains of increased fatigue.  Plan: CBC with Differential/Platelet, COMPLETE  METABOLIC PANEL WITH GFR, CK  History of proteinuria syndrome -patient states in 2021, 2 months postpartum she developed proteinuria.  She was evaluated by nephrologist.  She had renal biopsy which showed minimal-change disease.  She was placed on prednisone and CellCept for 6 months from March 2022 until November 2022.  She states after that the medications were discontinued and she had no recurrence of symptoms.  She has been followed by her PCP.  She had no pedal edema on the examination today.  Plan: Urinalysis, Routine w reflex microscopic  DEFORMITY, CAVUS, FOOT, ACQUIRED-bowel Ro pes cavus was noted.  Arch support was advised.  Essential hypertension-blood pressure was 136/84 today.  Acne vulgaris  Alopecia areata-patient is followed by [facility] dermatology.  She states she had 2 episodes in the last  year which which showed responded to intralesional steroid injections.  Hirsutism  Elevated DHEA-patient has been evaluated by [facility] endocrinology.  PCOS (polycystic ovarian syndrome)  Generalized anxiety disorder  Family history of Crohn's disease-Brother  Orders: Orders Placed This Encounter  Procedures   XR Hand 2 View Right   XR Hand 2 View Left   CBC with Differential/Platelet   COMPLETE METABOLIC PANEL WITH GFR   Urinalysis, Routine w reflex microscopic   Sedimentation rate   CK   ANA   Anti-scleroderma antibody   RNP Antibody   Anti-Smith antibody   Sjogrens syndrome-A extractable nuclear antibody   Sjogrens syndrome-B extractable nuclear antibody   Anti-DNA antibody, double-stranded   C3 and C4   Beta-2  glycoprotein antibodies   Cardiolipin antibodies, IgG, IgM, IgA   Rheumatoid factor   Cyclic citrul peptide antibody, IgG   No orders of the defined types  were placed in this encounter.    Follow-Up Instructions: Return for Stiffness in hands, dry mouth and dry eyes.   Maya Nash, MD  Note - This record has been created using Animal nutritionist.  Chart creation errors have been sought, but may not always  have been located. Such creation errors do not reflect on  the standard of medical care.

## 2023-07-14 ENCOUNTER — Ambulatory Visit: Payer: BC Managed Care – PPO

## 2023-07-14 ENCOUNTER — Encounter: Payer: Self-pay | Admitting: Rheumatology

## 2023-07-14 ENCOUNTER — Ambulatory Visit: Payer: BC Managed Care – PPO | Attending: Rheumatology | Admitting: Rheumatology

## 2023-07-14 VITALS — BP 136/84 | HR 69 | Resp 14 | Ht 66.25 in | Wt 128.0 lb

## 2023-07-14 DIAGNOSIS — F411 Generalized anxiety disorder: Secondary | ICD-10-CM

## 2023-07-14 DIAGNOSIS — M2559 Pain in other specified joint: Secondary | ICD-10-CM

## 2023-07-14 DIAGNOSIS — Z87448 Personal history of other diseases of urinary system: Secondary | ICD-10-CM

## 2023-07-14 DIAGNOSIS — L639 Alopecia areata, unspecified: Secondary | ICD-10-CM

## 2023-07-14 DIAGNOSIS — M216X9 Other acquired deformities of unspecified foot: Secondary | ICD-10-CM

## 2023-07-14 DIAGNOSIS — R5383 Other fatigue: Secondary | ICD-10-CM

## 2023-07-14 DIAGNOSIS — E282 Polycystic ovarian syndrome: Secondary | ICD-10-CM

## 2023-07-14 DIAGNOSIS — L680 Hirsutism: Secondary | ICD-10-CM

## 2023-07-14 DIAGNOSIS — Z8379 Family history of other diseases of the digestive system: Secondary | ICD-10-CM

## 2023-07-14 DIAGNOSIS — M79641 Pain in right hand: Secondary | ICD-10-CM

## 2023-07-14 DIAGNOSIS — L700 Acne vulgaris: Secondary | ICD-10-CM

## 2023-07-14 DIAGNOSIS — I10 Essential (primary) hypertension: Secondary | ICD-10-CM

## 2023-07-14 DIAGNOSIS — H04129 Dry eye syndrome of unspecified lacrimal gland: Secondary | ICD-10-CM

## 2023-07-14 DIAGNOSIS — M79642 Pain in left hand: Secondary | ICD-10-CM

## 2023-07-14 DIAGNOSIS — R768 Other specified abnormal immunological findings in serum: Secondary | ICD-10-CM | POA: Diagnosis not present

## 2023-07-14 DIAGNOSIS — R7989 Other specified abnormal findings of blood chemistry: Secondary | ICD-10-CM

## 2023-07-17 NOTE — Progress Notes (Signed)
 UA shows trace white cells (not significant), rheumatoid factor positive, CBC and CMP are normal, sedimentation rate normal, CK normal, ANA negative, ENA panel negative C3-C4 normal, beta-2 GP 1 negative, anticardiolipin negative.  Anti-CCP pending.

## 2023-07-19 LAB — CBC WITH DIFFERENTIAL/PLATELET
Absolute Lymphocytes: 1470 {cells}/uL (ref 850–3900)
Absolute Monocytes: 363 {cells}/uL (ref 200–950)
Basophils Absolute: 29 {cells}/uL (ref 0–200)
Basophils Relative: 0.6 %
Eosinophils Absolute: 103 {cells}/uL (ref 15–500)
Eosinophils Relative: 2.1 %
HCT: 41.8 % (ref 35.0–45.0)
Hemoglobin: 13.6 g/dL (ref 11.7–15.5)
MCH: 29.4 pg (ref 27.0–33.0)
MCHC: 32.5 g/dL (ref 32.0–36.0)
MCV: 90.3 fL (ref 80.0–100.0)
MPV: 10.3 fL (ref 7.5–12.5)
Monocytes Relative: 7.4 %
Neutro Abs: 2935 {cells}/uL (ref 1500–7800)
Neutrophils Relative %: 59.9 %
Platelets: 296 10*3/uL (ref 140–400)
RBC: 4.63 10*6/uL (ref 3.80–5.10)
RDW: 12.1 % (ref 11.0–15.0)
Total Lymphocyte: 30 %
WBC: 4.9 10*3/uL (ref 3.8–10.8)

## 2023-07-19 LAB — COMPLETE METABOLIC PANEL WITH GFR
AG Ratio: 2.1 (calc) (ref 1.0–2.5)
ALT: 17 U/L (ref 6–29)
AST: 18 U/L (ref 10–30)
Albumin: 4.8 g/dL (ref 3.6–5.1)
Alkaline phosphatase (APISO): 64 U/L (ref 31–125)
BUN: 11 mg/dL (ref 7–25)
CO2: 22 mmol/L (ref 20–32)
Calcium: 9.5 mg/dL (ref 8.6–10.2)
Chloride: 107 mmol/L (ref 98–110)
Creat: 0.63 mg/dL (ref 0.50–0.97)
Globulin: 2.3 g/dL (ref 1.9–3.7)
Glucose, Bld: 93 mg/dL (ref 65–99)
Potassium: 3.8 mmol/L (ref 3.5–5.3)
Sodium: 140 mmol/L (ref 135–146)
Total Bilirubin: 1.1 mg/dL (ref 0.2–1.2)
Total Protein: 7.1 g/dL (ref 6.1–8.1)
eGFR: 120 mL/min/{1.73_m2} (ref >=60)

## 2023-07-19 LAB — CARDIOLIPIN ANTIBODIES, IGG, IGM, IGA
Anticardiolipin IgA: <2 [APL'U]/mL (ref <20.0)
Anticardiolipin IgG: <2 [GPL'U]/mL (ref <20.0)
Anticardiolipin IgM: 2.1 [MPL'U]/mL (ref <20.0)

## 2023-07-19 LAB — ANTI-SMITH ANTIBODY: ENA SM Ab Ser-aCnc: <1 AI

## 2023-07-19 LAB — RHEUMATOID FACTOR: Rheumatoid fact SerPl-aCnc: 52 [IU]/mL — ABNORMAL HIGH (ref <14)

## 2023-07-19 LAB — ANTI-SCLERODERMA ANTIBODY: Scleroderma (Scl-70) (ENA) Antibody, IgG: <1 AI

## 2023-07-19 LAB — BETA-2 GLYCOPROTEIN ANTIBODIES
Beta-2 Glyco 1 IgA: <2 U/mL (ref <20.0)
Beta-2 Glyco 1 IgM: 2 U/mL (ref <20.0)
Beta-2 Glyco I IgG: <2 U/mL (ref <20.0)

## 2023-07-19 LAB — URINALYSIS, ROUTINE W REFLEX MICROSCOPIC
Bacteria, UA: NONE SEEN /HPF
Bilirubin Urine: NEGATIVE
Glucose, UA: NEGATIVE
Hgb urine dipstick: NEGATIVE
Hyaline Cast: NONE SEEN /LPF
Ketones, ur: NEGATIVE
Nitrite: NEGATIVE
Protein, ur: NEGATIVE
RBC / HPF: NONE SEEN /HPF (ref 0–2)
Specific Gravity, Urine: 1.008 (ref 1.001–1.035)
WBC, UA: NONE SEEN /HPF (ref 0–5)
pH: 7.5 (ref 5.0–8.0)

## 2023-07-19 LAB — ANTI-DNA ANTIBODY, DOUBLE-STRANDED: ds DNA Ab: <1 [IU]/mL

## 2023-07-19 LAB — SEDIMENTATION RATE: Sed Rate: 2 mm/h (ref 0–20)

## 2023-07-19 LAB — CK: Total CK: 100 U/L (ref 29–143)

## 2023-07-19 LAB — SJOGRENS SYNDROME-B EXTRACTABLE NUCLEAR ANTIBODY: SSB (La) (ENA) Antibody, IgG: <1 AI

## 2023-07-19 LAB — C3 AND C4
C3 Complement: 111 mg/dL (ref 83–193)
C4 Complement: 23 mg/dL (ref 15–57)

## 2023-07-19 LAB — RNP ANTIBODY: Ribonucleic Protein(ENA) Antibody, IgG: <1 AI

## 2023-07-19 LAB — ANA: Anti Nuclear Antibody (ANA): NEGATIVE

## 2023-07-19 LAB — MICROSCOPIC MESSAGE

## 2023-07-19 LAB — SJOGRENS SYNDROME-A EXTRACTABLE NUCLEAR ANTIBODY: SSA (Ro) (ENA) Antibody, IgG: <1 AI

## 2023-07-19 LAB — CYCLIC CITRUL PEPTIDE ANTIBODY, IGG: Cyclic Citrullin Peptide Ab: <16 U

## 2023-07-19 NOTE — Progress Notes (Signed)
 Anti-CCP negative.

## 2023-07-20 DIAGNOSIS — F411 Generalized anxiety disorder: Secondary | ICD-10-CM | POA: Diagnosis not present

## 2023-07-20 DIAGNOSIS — F902 Attention-deficit hyperactivity disorder, combined type: Secondary | ICD-10-CM | POA: Diagnosis not present

## 2023-07-26 NOTE — Progress Notes (Deleted)
 Office Visit Note  Patient: Tamara Mills             Date of Birth: 1990/12/14           MRN: [id number]             PCP: Judy Pimple, MD Referring: Tower, Audrie Gallus, MD Visit Date: 08/09/2023 Occupation: @GUAROCC @  Subjective:  No chief complaint on file.   History of Present Illness: Tamara Mills is a 33 y.o. female ***     Activities of Daily Living:  Patient reports morning stiffness for *** {minute/hour:19697}.   Patient {ACTIONS;DENIES/REPORTS:21021675::"Denies"} nocturnal pain.  Difficulty dressing/grooming: {ACTIONS;DENIES/REPORTS:21021675::"Denies"} Difficulty climbing stairs: {ACTIONS;DENIES/REPORTS:21021675::"Denies"} Difficulty getting out of chair: {ACTIONS;DENIES/REPORTS:21021675::"Denies"} Difficulty using hands for taps, buttons, cutlery, and/or writing: {ACTIONS;DENIES/REPORTS:21021675::"Denies"}  No Rheumatology ROS completed.   PMFS History:  Patient Active Problem List   Diagnosis Date Noted   PCOS (polycystic ovarian syndrome) 03/08/2023   Elevated rheumatoid factor 02/02/2023   Arthralgia 02/01/2023   Dry eye 02/01/2023   Elevated DHEA 01/17/2023   Hirsutism 01/17/2023   Alopecia areata 11/18/2021   Family history of autoimmune disorder 09/04/2020   Essential hypertension 11/04/2017   Family history of sudden cardiac death 2016-07-17   Routine general medical examination at a health care facility 08/29/2013   Acne vulgaris 04/28/2012   Anxiety disorder 02/28/2007   DEFORMITY, CAVUS, FOOT, ACQUIRED 02/23/2007    Past Medical History:  Diagnosis Date   Acne    Allergy    Amenorrhea    Hx of   Anxiety    COVID-19    Eating disorder    ? eating disorder in past ( pt states just running too much)   Hand eczema    History of gestational hypertension 11/30/2019   Hypertension    Nephrotic syndrome 09/08/2020   After pregnancy   Seasonal allergies    Underweight    Hx of    Family History  Problem Relation Age of Onset   Hypertension  Mother        had also preeclampsia   Crohn's disease Brother    Ulcerative colitis Brother    Sudden Cardiac Death Brother    Diabetes Maternal Grandmother        Type 1   Cancer Paternal Grandmother 52       breast   Breast cancer Paternal Grandmother    Heart failure Paternal Grandfather        heart disease   Cancer Paternal Aunt 43       breast   Breast cancer Paternal Aunt    Ovarian cancer Neg Hx    Colon cancer Neg Hx    Esophageal cancer Neg Hx    Rectal cancer Neg Hx    Stomach cancer Neg Hx    Past Surgical History:  Procedure Laterality Date   WISDOM TOOTH EXTRACTION     Social History   Social History Narrative   Not on file   Immunization History  Administered Date(s) Administered   Influenza Split 04/22/2011, 03/31/2012   Influenza Whole 04/24/2007, 04/25/2009, 04/03/2010   Influenza,inj,Quad PF,6+ Mos 03/09/2013, 04/12/2014, 04/10/2015, 04/30/2016, 04/12/2018, 04/10/2020, 03/27/2021   Influenza-Unspecified 04/14/2017, 04/12/2019   Moderna Sars-Covid-2 Vaccination 07/16/2019, 08/13/2019   PPD Test 09/20/2011, 09/22/2012, 08/29/2013, 10/14/2017   Td 08/12/2002   Tdap 09/22/2012, 06/24/2017, 04/10/2020     Objective: Vital Signs: LMP 06/15/2023    Physical Exam   Musculoskeletal Exam: ***  CDAI Exam: CDAI Score: -- Patient Global: --; Provider  Global: -- Swollen: --; Tender: -- Joint Exam 08/09/2023   No joint exam has been documented for this visit   There is currently no information documented on the homunculus. Go to the Rheumatology activity and complete the homunculus joint exam.  Investigation: No additional findings.  Imaging: XR Hand 2 View Left Result Date: 07/14/2023 CMC, PIP, DIP, MCPs, intercarpal and radiocarpal joints are within normal limits.  No erosive changes were noted. Impression: Unremarkable x-rays of the hand.  XR Hand 2 View Right Result Date: 07/14/2023 CMC, PIP, DIP, MCPs, intercarpal and radiocarpal joints are  within normal limits.  No erosive changes were noted. Impression: Unremarkable x-rays of the hand.   Recent Labs: Lab Results  Component Value Date   WBC 4.9 07/14/2023   HGB 13.6 07/14/2023   PLT 296 07/14/2023   NA 140 07/14/2023   K 3.8 07/14/2023   CL 107 07/14/2023   CO2 22 07/14/2023   GLUCOSE 93 07/14/2023   BUN 11 07/14/2023   CREATININE 0.63 07/14/2023   BILITOT 1.1 07/14/2023   ALKPHOS 69 01/12/2023   AST 18 07/14/2023   ALT 17 07/14/2023   PROT 7.1 07/14/2023   ALBUMIN 4.8 01/12/2023   CALCIUM 9.5 07/14/2023   GFRAA 146 04/10/2020  July 14, 2023 UA negative, ANA negative, ENA negative, C3-C4 normal, anticardiolipin negative, beta-2 GP 1 negative RF 52, anti-CCP<16, ESR 2, CK100  Speciality Comments: No specialty comments available.  Procedures:  No procedures performed Allergies: Ortho tri-cyclen [norgestimate-eth estradiol] and Nickel   Assessment / Plan:     Visit Diagnoses: No diagnosis found.  Orders: No orders of the defined types were placed in this encounter.  No orders of the defined types were placed in this encounter.   Face-to-face time spent with patient was *** minutes. Greater than 50% of time was spent in counseling and coordination of care.  Follow-Up Instructions: No follow-ups on file.   Pollyann Savoy, MD  Note - This record has been created using Animal nutritionist.  Chart creation errors have been sought, but may not always  have been located. Such creation errors do not reflect on  the standard of medical care.

## 2023-07-29 ENCOUNTER — Ambulatory Visit: Payer: BC Managed Care – PPO

## 2023-07-29 ENCOUNTER — Ambulatory Visit: Payer: BC Managed Care – PPO | Attending: Rheumatology | Admitting: Rheumatology

## 2023-07-29 DIAGNOSIS — M79642 Pain in left hand: Secondary | ICD-10-CM

## 2023-07-29 DIAGNOSIS — M79641 Pain in right hand: Secondary | ICD-10-CM | POA: Diagnosis not present

## 2023-07-29 DIAGNOSIS — R768 Other specified abnormal immunological findings in serum: Secondary | ICD-10-CM

## 2023-07-29 NOTE — Progress Notes (Signed)
Patient was here to get the ultrasound examination of her hands due to ongoing stiffness and discomfort.  Her rheumatoid factor was positive.  Ultrasound examination of bilateral hands was performed per EULAR recommendations. Using 15 MHz transducer, grayscale and power Doppler bilateral second,and third MCP joints and bilateral second and third PIP joints both dorsal and volar aspects were evaluated to look for synovitis or tenosynovitis. The findings were there was no synovitis or tenosynovitis on ultrasound examination.  Impression: No synovitis was noted on the limited ultrasound examination of the hands.  Results were reviewed with the patient.  Pollyann Savoy, MD

## 2023-08-09 ENCOUNTER — Ambulatory Visit: Payer: BC Managed Care – PPO | Admitting: Rheumatology

## 2023-08-09 DIAGNOSIS — Z87448 Personal history of other diseases of urinary system: Secondary | ICD-10-CM

## 2023-08-09 DIAGNOSIS — E282 Polycystic ovarian syndrome: Secondary | ICD-10-CM

## 2023-08-09 DIAGNOSIS — Z8379 Family history of other diseases of the digestive system: Secondary | ICD-10-CM

## 2023-08-09 DIAGNOSIS — L639 Alopecia areata, unspecified: Secondary | ICD-10-CM

## 2023-08-09 DIAGNOSIS — M79641 Pain in right hand: Secondary | ICD-10-CM

## 2023-08-09 DIAGNOSIS — R768 Other specified abnormal immunological findings in serum: Secondary | ICD-10-CM

## 2023-08-09 DIAGNOSIS — M3500 Sicca syndrome, unspecified: Secondary | ICD-10-CM

## 2023-08-09 DIAGNOSIS — L700 Acne vulgaris: Secondary | ICD-10-CM

## 2023-08-09 DIAGNOSIS — F411 Generalized anxiety disorder: Secondary | ICD-10-CM

## 2023-08-09 DIAGNOSIS — I10 Essential (primary) hypertension: Secondary | ICD-10-CM

## 2023-08-09 DIAGNOSIS — R7989 Other specified abnormal findings of blood chemistry: Secondary | ICD-10-CM

## 2023-08-09 DIAGNOSIS — L680 Hirsutism: Secondary | ICD-10-CM

## 2023-08-09 DIAGNOSIS — Q6671 Congenital pes cavus, right foot: Secondary | ICD-10-CM

## 2023-08-22 DIAGNOSIS — E279 Disorder of adrenal gland, unspecified: Secondary | ICD-10-CM | POA: Diagnosis not present

## 2023-08-22 DIAGNOSIS — L680 Hirsutism: Secondary | ICD-10-CM | POA: Diagnosis not present

## 2023-08-22 DIAGNOSIS — R5383 Other fatigue: Secondary | ICD-10-CM | POA: Diagnosis not present

## 2023-08-22 DIAGNOSIS — Z862 Personal history of diseases of the blood and blood-forming organs and certain disorders involving the immune mechanism: Secondary | ICD-10-CM | POA: Diagnosis not present

## 2023-10-04 DIAGNOSIS — Z803 Family history of malignant neoplasm of breast: Secondary | ICD-10-CM | POA: Diagnosis not present

## 2023-10-05 ENCOUNTER — Encounter: Payer: Self-pay | Admitting: Family Medicine

## 2023-10-05 DIAGNOSIS — Z1231 Encounter for screening mammogram for malignant neoplasm of breast: Secondary | ICD-10-CM

## 2023-10-06 DIAGNOSIS — Z1231 Encounter for screening mammogram for malignant neoplasm of breast: Secondary | ICD-10-CM | POA: Insufficient documentation

## 2023-10-12 DIAGNOSIS — F411 Generalized anxiety disorder: Secondary | ICD-10-CM | POA: Diagnosis not present

## 2023-10-12 DIAGNOSIS — F902 Attention-deficit hyperactivity disorder, combined type: Secondary | ICD-10-CM | POA: Diagnosis not present

## 2023-12-11 ENCOUNTER — Encounter: Payer: Self-pay | Admitting: Family Medicine

## 2023-12-11 DIAGNOSIS — N393 Stress incontinence (female) (male): Secondary | ICD-10-CM

## 2023-12-12 DIAGNOSIS — R32 Unspecified urinary incontinence: Secondary | ICD-10-CM | POA: Insufficient documentation

## 2023-12-12 NOTE — Telephone Encounter (Signed)
 I put the referral in for pelvic floor PT Please let us  know if you don't hear in 1-2 weeks to set that up

## 2024-01-04 DIAGNOSIS — F411 Generalized anxiety disorder: Secondary | ICD-10-CM | POA: Diagnosis not present

## 2024-01-04 DIAGNOSIS — F902 Attention-deficit hyperactivity disorder, combined type: Secondary | ICD-10-CM | POA: Diagnosis not present

## 2024-02-21 DIAGNOSIS — E282 Polycystic ovarian syndrome: Secondary | ICD-10-CM | POA: Diagnosis not present

## 2024-02-21 DIAGNOSIS — Z1331 Encounter for screening for depression: Secondary | ICD-10-CM | POA: Diagnosis not present

## 2024-02-21 NOTE — Progress Notes (Signed)
 This video encounter was conducted with the patient's (or proxy's) verbal consent via secure, interactive audio and video telecommunications.    The patient (or proxy) was instructed by the virtual care center staff to have this encounter in a suitably private space and to only have persons present to whom they give permission to participate. In addition, patient identity was confirmed by use of name and date of birth.   Telehealth visit was conducted with Tamara Mills via HIPAA compliant video platform.

## 2024-03-27 DIAGNOSIS — F902 Attention-deficit hyperactivity disorder, combined type: Secondary | ICD-10-CM | POA: Diagnosis not present

## 2024-03-27 DIAGNOSIS — F411 Generalized anxiety disorder: Secondary | ICD-10-CM | POA: Diagnosis not present

## 2024-04-05 ENCOUNTER — Other Ambulatory Visit: Payer: Self-pay

## 2024-04-05 ENCOUNTER — Ambulatory Visit: Attending: Family Medicine

## 2024-04-05 DIAGNOSIS — R2689 Other abnormalities of gait and mobility: Secondary | ICD-10-CM | POA: Diagnosis not present

## 2024-04-05 DIAGNOSIS — N393 Stress incontinence (female) (male): Secondary | ICD-10-CM | POA: Insufficient documentation

## 2024-04-05 DIAGNOSIS — R293 Abnormal posture: Secondary | ICD-10-CM | POA: Diagnosis not present

## 2024-04-05 DIAGNOSIS — M6281 Muscle weakness (generalized): Secondary | ICD-10-CM | POA: Diagnosis not present

## 2024-04-05 NOTE — Therapy (Signed)
 OUTPATIENT PHYSICAL THERAPY FEMALE PELVIC EVALUATION   Patient Name: Tamara Mills MRN: [id number] DOB:[birth year], 33 y.o., female Today's Date: 04/05/2024  END OF SESSION:  PT End of Session - 04/05/24 0900     Visit Number 1    Number of Visits 9    Date for Recertification  06/04/24    Authorization Type BCBS    Progress Note Due on Visit 10    PT Start Time [phone]    PT Stop Time 0930    PT Time Calculation (min) 40 min    Activity Tolerance Patient tolerated treatment well    Behavior During Therapy [facility] for tasks assessed/performed          Past Medical History:  Diagnosis Date   Acne    Allergy    Amenorrhea    Hx of   Anxiety    COVID-19    Eating disorder    ? eating disorder in past ( pt states just running too much)   Hand eczema    History of gestational hypertension 11/30/2019   Hypertension    Nephrotic syndrome 09/08/2020   After pregnancy   Seasonal allergies    Underweight    Hx of   Past Surgical History:  Procedure Laterality Date   WISDOM TOOTH EXTRACTION     Patient Active Problem List   Diagnosis Date Noted   Urinary incontinence 12/12/2023   Screening mammogram for breast cancer 10/06/2023   PCOS (polycystic ovarian syndrome) 03/08/2023   Elevated rheumatoid factor 02/02/2023   Arthralgia 02/01/2023   Dry eye 02/01/2023   Elevated DHEA 01/17/2023   Hirsutism 01/17/2023   Alopecia areata 11/18/2021   Family history of autoimmune disorder 09/04/2020   Essential hypertension 11/04/2017   Family history of sudden cardiac death Jul 11, 2016   Routine general medical examination at a health care facility 08/29/2013   Acne vulgaris 04/28/2012   Anxiety disorder 02/28/2007   DEFORMITY, CAVUS, FOOT, ACQUIRED 02/23/2007    PCP: Dr. Laine Balls  REFERRING PROVIDER: Dr. Laine Balls  REFERRING DIAG: SUI  THERAPY DIAG:  Urinary, incontinence, stress female  Muscle weakness (generalized)  Other abnormalities of gait and  mobility  Abnormal posture  Rationale for Evaluation and Treatment: Rehabilitation  ONSET DATE: 12/2023 referral date but started in high school  SUBJECTIVE:                                                                                                                                                                                           SUBJECTIVE STATEMENT: URINARY FUNCTION: Pt gets up at least once a night to void, she voids every 1-2  hours. Pt denied yeast infections or UTIs, no pain with voiding. She feels like stream is strong. Pt leaks mostly when she's running (hard running or jumping exercises with HIIT workouts and sneezing). Pt has urgency incontinence as well. She has cut down hard running and mainly sticks to strength training, so leakage occurs a few days a week. Wears pads daily (light ones).  BOWEL FUNCTION: Leakage occurs with hard running a few times with bad stomach pain. Pt has a BM once a day and mostly types 3 or 4 except for around her period. Pt with hx of hemorrhoids after pregnancy but does not have issues now.  CORE STABILITY: Pt denied falls on coccyx, MVAs, surgeries or issues with core. Does not notice bulging or doming during core workouts.  SEXUAL FUNCTION: Pt denied issues with intercourse, tampon insertion, or OBGYN exam. Pt issues with climaxing, not sure if it's medication related.   Fluid intake: drinks a half a pot to a pot of coffee a day, water afterwards (tries for 64 oz.), occasional diet coke in the afternoon, occasional wine at night  FUNCTIONAL LIMITATIONS: unable to run hard 2/2 leakage and sneezing.  PERTINENT HISTORY:  Medications for current condition: none Surgeries: none Other: Anxiety, cavus deformity (over pronate), arthralgia, rheumatoid factor, urinary incontinence, hx of preeclampsia (nephrotic syndrome), PCOS Sexual abuse: No   PAIN:  Are you having pain? No NPRS scale: 0/10  PRECAUTIONS: None  RED FLAGS: None   WEIGHT  BEARING RESTRICTIONS: No  FALLS:  Has patient fallen in last 6 months? No  OCCUPATION: pharmacist (part-time)  ACTIVITY LEVEL : strength training 3-4 days a week, and jogging or HIIT or peloton bike   PLOF: Independent  PATIENT GOALS: To be able to run without urinary or fecal leakage, improve sexual desire, whole body balance    BOWEL MOVEMENT: Pain with bowel movement: No Type of bowel movement:Frequency once a day Fully empty rectum: No most of the time yes, but sometimes no. Leakage: Yes: with hard running                                                     Caused by:  Pads: Yes: light pads daily  Fiber supplement/laxative No  URINATION: Pain with urination: No Fully empty bladder: No most of the time                             Stream: Strong Urgency: Yes  Frequency:during the day every 1-2 hours                                                         Nocturia: Yes: once a night    Leakage: Urge to void, Walking to the bathroom, Coughing, Sneezing, Laughing, Exercise, and Lifting Pads/briefs: Yes: only during heavy workouts   INTERCOURSE:  Ability to have vaginal penetration Yes  Pain with intercourse: none Dryness: No Climax: difficulty  Marinoff Scale: 0/3 Lubricant: yes  PREGNANCY: Number of pregnancies: 2 Vaginal deliveries 2 Tearing Yes: with first pregnancy (believes it was second degree) Episiotomy No C-section deliveries 0 Currently pregnant No  PROLAPSE:  None   OBJECTIVE:  Note: Objective measures were completed at Evaluation unless otherwise noted.   COGNITION: Overall cognitive status: Within functional limits for tasks assessed     SENSATION: Light touch: Appears intact  FUNCTIONAL TESTS:  Single leg stance:  Rt: incr. Postural sway  Lt: incr. Postural sway Sit-up test: Squat: Bed mobility:  GAIT: Assistive device utilized: None Comments: decr. Trunk rot  POSTURE: forward head, increased thoracic kyphosis, and posterior pelvic  tilt   LUMBARAROM/PROM:  A/PROM A/PROM  Eval (% available)  Flexion   Extension   Right lateral flexion   Left lateral flexion   Right rotation   Left rotation    (Blank rows = not tested)  LOWER EXTREMITY ROM:  Active ROM Right eval Left eval  Hip flexion    Hip extension    Hip abduction    Hip adduction    Hip internal rotation    Hip external rotation    Knee flexion    Knee extension    Ankle dorsiflexion    Ankle plantarflexion    Ankle inversion    Ankle eversion     (Blank rows = not tested)  LOWER EXTREMITY MMT:  MMT Right eval Left eval  Hip flexion    Hip extension    Hip abduction    Hip adduction    Hip internal rotation    Hip external rotation    Knee flexion    Knee extension    Ankle dorsiflexion    Ankle plantarflexion    Ankle inversion    Ankle eversion     (Blank rows = not tested) PALPATION:  General: no TTP during palpation of spine and SIJ while standing    PELVIC MMT:   MMT eval  Vaginal   Internal Anal Sphincter   External Anal Sphincter   Puborectalis   Diastasis Recti   (Blank rows = not tested)        TONE: limited by time constraints  PROLAPSE: limited by time constraints   TODAY'S TREATMENT:                                                                                                                              DATE: 04/05/24  EVAL   SELF CARE: PATIENT EDUCATION:  Education details: PT educated pt on main functions of the pelvic floor, IAP, breath and PFM relationship. PT discussed POC, frequency and duration. PT provided the following education: TOILET POSTURE: Urination: feet flat, lean forward with forearms on legs to fully empty bladder. Bowel movement: place feet flat on Squatty Potty or stool so knees are higher than hips, lean forward to relax pelvic floor in order to avoid strain.  SHOES: wear supportive shoes, and sandals with straps.  POSTURE: try not to cross legs at knees or ankles.  Try the figure four stretch instead.  WATER: start with water first thing in the morning.   PELVIC TILTS: try to stand  in neutral, not tucking your tail and not arching back, but in the middle. Also, discussed incr. Fiber and protein to solidify stool to decr. Fecal leakage. Person educated: Patient Education method: Explanation, Demonstration, and Handouts Education comprehension: verbalized understanding, returned demonstration, and needs further education  HOME EXERCISE PROGRAM: Not yet established.  ASSESSMENT:  CLINICAL IMPRESSION: Patient is a pleasant 33 y.o. female who was seen today for physical therapy evaluation and treatment for mixed incontinence of urine and fecal matter and difficulty climaxing.  Pt's PMH is significant for the following: Anxiety, cavus deformity (over pronate), arthralgia, rheumatoid factor, urinary incontinence, hx of preeclampsia (nephrotic syndrome), PCOS. The following impairments were noted upon exam: ROM of back [facility], will assess hips next session, postural dysfunction, decr. Strength likely 2/2 subjective reports and gait deviations, nocturia, mixed UI and fecal incontinence. Pt would benefit from skilled PT to improve safety and decr. Pain during all ADLs.   OBJECTIVE IMPAIRMENTS: Abnormal gait, decreased balance, decreased coordination, decreased ROM, decreased strength, hypomobility, increased fascial restrictions, and postural dysfunction.   ACTIVITY LIMITATIONS: carrying, lifting, bending, sitting, standing, squatting, sleeping, transfers, continence, locomotion level, and caring for others  PARTICIPATION LIMITATIONS: meal prep, cleaning, laundry, interpersonal relationship, community activity, and occupation  PERSONAL FACTORS: 3+ comorbidities: see above are also affecting patient's functional outcome.   REHAB POTENTIAL: Good  CLINICAL DECISION MAKING: Stable/uncomplicated  EVALUATION COMPLEXITY: Low   GOALS: Goals reviewed with  patient? Yes  SHORT TERM GOALS: Target date: for all STGs: 05/03/24  Pt will be IND in HEP to improve pain, strength, coordination. Baseline: works out most days of the week with UI and fecal incontinence Goal status: INITIAL  2.  Finish exam and write goals as indicated. Baseline: limited by time constraints Goal status: INITIAL  3.  Pt will demo proper toileting posture to fully empty bladder and reduce straining during bowel movement. Baseline: unable to demo Goal status: INITIAL  4.  Pt will demonstrated improved relaxation and contraction of PFM with coordination of breath to reduce urinary leakage to </=twice/week. Baseline: a few times a week with working out/running and sneezing Goal status: INITIAL   LONG TERM GOALS: Target date: for all LTGs: 05/31/24  Pt will demonstrated improved relaxation and contraction of PFM with coordination of breath to reduce urinary leakage to </=once/week. Baseline: a few times a week with working out/running and sneezing Goal status: INITIAL  2.  Pt will demonstrate improved relaxation and contraction of pelvic floor muscles (PFM) with coordination of breath to climax without difficulty during intercourse with spouse. Baseline: difficulty climaxing Goal status: INITIAL  Goal status: INITIAL  PLAN: finish exam (palpation, ROM, MMT, DR) Establish HEP. Assess pronation of feet and running.   PT FREQUENCY: 1x/week  PT DURATION: 8 weeks  PLANNED INTERVENTIONS: 97164- PT Re-evaluation, 97110-Therapeutic exercises, 97530- Therapeutic activity, 97112- Neuromuscular re-education, 97535- Self Care, 02859- Manual therapy, [phone]- Gait training, [phone] (1-2 muscles), 20561 (3+ muscles)- Dry Needling, Patient/Family education, Balance training, Taping, Joint mobilization, Spinal mobilization, Cryotherapy, Moist heat, and Biofeedback     Quintez Maselli L, PT 04/05/2024, 9:00 AM  Delon Pinal, PT,DPT 04/05/24 9:00 AM Phone:  [phone] Fax: [phone]

## 2024-04-05 NOTE — Patient Instructions (Signed)

## 2024-04-10 DIAGNOSIS — F411 Generalized anxiety disorder: Secondary | ICD-10-CM | POA: Diagnosis not present

## 2024-04-12 DIAGNOSIS — F900 Attention-deficit hyperactivity disorder, predominantly inattentive type: Secondary | ICD-10-CM | POA: Diagnosis not present

## 2024-04-12 DIAGNOSIS — F411 Generalized anxiety disorder: Secondary | ICD-10-CM | POA: Diagnosis not present

## 2024-04-17 DIAGNOSIS — F900 Attention-deficit hyperactivity disorder, predominantly inattentive type: Secondary | ICD-10-CM | POA: Diagnosis not present

## 2024-04-17 DIAGNOSIS — F411 Generalized anxiety disorder: Secondary | ICD-10-CM | POA: Diagnosis not present

## 2024-04-19 ENCOUNTER — Ambulatory Visit

## 2024-04-24 DIAGNOSIS — F902 Attention-deficit hyperactivity disorder, combined type: Secondary | ICD-10-CM | POA: Diagnosis not present

## 2024-04-24 DIAGNOSIS — F900 Attention-deficit hyperactivity disorder, predominantly inattentive type: Secondary | ICD-10-CM | POA: Diagnosis not present

## 2024-04-24 DIAGNOSIS — F411 Generalized anxiety disorder: Secondary | ICD-10-CM | POA: Diagnosis not present

## 2024-04-25 ENCOUNTER — Ambulatory Visit

## 2024-04-26 ENCOUNTER — Ambulatory Visit: Attending: Family Medicine

## 2024-04-26 ENCOUNTER — Other Ambulatory Visit: Payer: Self-pay

## 2024-04-26 DIAGNOSIS — R2689 Other abnormalities of gait and mobility: Secondary | ICD-10-CM | POA: Diagnosis not present

## 2024-04-26 DIAGNOSIS — R293 Abnormal posture: Secondary | ICD-10-CM | POA: Insufficient documentation

## 2024-04-26 DIAGNOSIS — M6281 Muscle weakness (generalized): Secondary | ICD-10-CM | POA: Insufficient documentation

## 2024-04-26 DIAGNOSIS — N393 Stress incontinence (female) (male): Secondary | ICD-10-CM | POA: Insufficient documentation

## 2024-04-26 NOTE — Therapy (Signed)
 OUTPATIENT PHYSICAL THERAPY FEMALE PELVIC TREATMENT    Patient Name: Tamara Mills MRN: [id number] DOB:1991-06-17, 33 y.o., female Today's Date: 04/26/2024  END OF SESSION:  PT End of Session - 04/26/24 1317     Visit Number 2    Number of Visits 9    Date for Recertification  06/04/24    Authorization Type BCBS    Progress Note Due on Visit 10    PT Start Time 1315    PT Stop Time 1353    PT Time Calculation (min) 38 min    Activity Tolerance Patient tolerated treatment well    Behavior During Therapy WFL for tasks assessed/performed          Past Medical History:  Diagnosis Date   Acne    Allergy    Amenorrhea    Hx of   Anxiety    COVID-19    Eating disorder    ? eating disorder in past ( pt states just running too much)   Hand eczema    History of gestational hypertension 11/30/2019   Hypertension    Nephrotic syndrome 09/08/2020   After pregnancy   Seasonal allergies    Underweight    Hx of   Past Surgical History:  Procedure Laterality Date   WISDOM TOOTH EXTRACTION     Patient Active Problem List   Diagnosis Date Noted   Urinary incontinence 12/12/2023   Screening mammogram for breast cancer 10/06/2023   PCOS (polycystic ovarian syndrome) 03/08/2023   Elevated rheumatoid factor 02/02/2023   Arthralgia 02/01/2023   Dry eye 02/01/2023   Elevated DHEA 01/17/2023   Hirsutism 01/17/2023   Alopecia areata 11/18/2021   Family history of autoimmune disorder 09/04/2020   Essential hypertension 11/04/2017   Family history of sudden cardiac death 07-12-2016   Routine general medical examination at a health care facility 08/29/2013   Acne vulgaris 04/28/2012   Anxiety disorder 02/28/2007   DEFORMITY, CAVUS, FOOT, ACQUIRED 02/23/2007    PCP: Dr. Laine Balls  REFERRING PROVIDER: Dr. Laine Balls  REFERRING DIAG: SUI  THERAPY DIAG:  Urinary, incontinence, stress female  Muscle weakness (generalized)  Other abnormalities of gait and  mobility  Abnormal posture  Rationale for Evaluation and Treatment: Rehabilitation  ONSET DATE: 12/2023 referral date but started in high school  SUBJECTIVE:                                                                                                                                                                                           SUBJECTIVE STATEMENT: 10/16: Pt reported she got a squatty potty and it helps. She had a cold,  so she hasn't run in awhile. Sometimes trail running, but mostly on the street.   EVAL:  URINARY FUNCTION: Pt gets up at least once a night to void, she voids every 1-2 hours. Pt denied yeast infections or UTIs, no pain with voiding. She feels like stream is strong. Pt leaks mostly when she's running (hard running or jumping exercises with HIIT workouts and sneezing). Pt has urgency incontinence as well. She has cut down hard running and mainly sticks to strength training, so leakage occurs a few days a week. Wears pads daily (light ones).  BOWEL FUNCTION: Leakage occurs with hard running a few times with bad stomach pain. Pt has a BM once a day and mostly types 3 or 4 except for around her period. Pt with hx of hemorrhoids after pregnancy but does not have issues now.  CORE STABILITY: Pt denied falls on coccyx, MVAs, surgeries or issues with core. Does not notice bulging or doming during core workouts.  SEXUAL FUNCTION: Pt denied issues with intercourse, tampon insertion, or OBGYN exam. Pt issues with climaxing, not sure if it's medication related.   Fluid intake: drinks a half a pot to a pot of coffee a day, water afterwards (tries for 64 oz.), occasional diet coke in the afternoon, occasional wine at night  FUNCTIONAL LIMITATIONS: unable to run hard 2/2 leakage and sneezing.  PERTINENT HISTORY:  Medications for current condition: none Surgeries: none Other: Anxiety, cavus deformity (over pronate), arthralgia, rheumatoid factor, urinary incontinence, hx of  preeclampsia (nephrotic syndrome), PCOS Sexual abuse: No   PAIN:  Are you having pain? No 04/26/24 NPRS scale: 0/10  PRECAUTIONS: None  RED FLAGS: None   WEIGHT BEARING RESTRICTIONS: No  FALLS:  Has patient fallen in last 6 months? No  OCCUPATION: pharmacist (part-time)  ACTIVITY LEVEL : strength training 3-4 days a week, and jogging or HIIT or peloton bike   PLOF: Independent  PATIENT GOALS: To be able to run without urinary or fecal leakage, improve sexual desire, whole body balance    BOWEL MOVEMENT: Pain with bowel movement: No Type of bowel movement:Frequency once a day Fully empty rectum: No most of the time yes, but sometimes no. Leakage: Yes: with hard running                                                     Caused by:  Pads: Yes: light pads daily  Fiber supplement/laxative No  URINATION: Pain with urination: No Fully empty bladder: No most of the time                             Stream: Strong Urgency: Yes  Frequency:during the day every 1-2 hours                                                         Nocturia: Yes: once a night    Leakage: Urge to void, Walking to the bathroom, Coughing, Sneezing, Laughing, Exercise, and Lifting Pads/briefs: Yes: only during heavy workouts   INTERCOURSE:  Ability to have vaginal penetration Yes  Pain with intercourse: none Dryness: No Climax: difficulty  Marinoff Scale: 0/3 Lubricant: yes  PREGNANCY: Number of pregnancies: 2 Vaginal deliveries 2 Tearing Yes: with first pregnancy (believes it was second degree) Episiotomy No C-section deliveries 0 Currently pregnant No  PROLAPSE: None   OBJECTIVE:  Note: Objective measures were completed at Evaluation unless otherwise noted.   COGNITION: Overall cognitive status: Within functional limits for tasks assessed     SENSATION: Light touch: Appears intact  FUNCTIONAL TESTS:  Single leg stance:  Rt: incr. Postural sway  Lt: incr. Postural sway Sit-up  test: Squat: Bed mobility:  GAIT: Assistive device utilized: None Comments: decr. Trunk rot  POSTURE: forward head, increased thoracic kyphosis, and posterior pelvic tilt   LUMBARAROM/PROM: WNL  A/PROM A/PROM  Eval (% available)  Flexion   Extension   Right lateral flexion   Left lateral flexion   Right rotation   Left rotation    (Blank rows = not tested)  LOWER EXTREMITY ROM: all WFL except for limited B hip ER/IR  Active ROM Right eval Left eval  Hip flexion    Hip extension    Hip abduction    Hip adduction    Hip internal rotation    Hip external rotation    Knee flexion    Knee extension    Ankle dorsiflexion    Ankle plantarflexion    Ankle inversion    Ankle eversion     (Blank rows = not tested)  LOWER EXTREMITY MMT:  MMT Right eval Left eval  Hip flexion 3+ 3+  Hip extension    Hip abduction 3+ 3+  Hip adduction 3+ 3+  Hip internal rotation 3+ 4-  Hip external rotation 3+ 4-  Knee flexion 4- 4-  Knee extension 5 5  Ankle dorsiflexion 5 5  Ankle plantarflexion    Ankle inversion    Ankle eversion     (Blank rows = not tested) PALPATION:  General: no TTP during palpation of spine and SIJ while standing  10/16: no TTP over glutes but incr. Tension noted. Tx spine and sacral hypomobility noted. No DR but abdominals took a second to fire with head to chest. Able to perform PFM contraction with external palpation.   PELVIC MMT:   MMT eval  Vaginal   Internal Anal Sphincter   External Anal Sphincter   Puborectalis   Diastasis Recti   (Blank rows = not tested)        TONE:  PROLAPSE:   TODAY'S TREATMENT:                                                                                                                              DATE: 04/26/24   Physical function test: PT completed exam (palpation, MMT, DR, ROM). See above for details.   NMR:  Access Code: [email or web address] URL: [email or web address] Date:  04/26/2024 Prepared by: Delon Pinal  Exercises - Supine Angels  - 1 x daily - 7 x weekly - 1 sets - 10 reps -  Sidelying Open Book  - 1 x daily - 7 x weekly - 1 sets - 10 reps - Supine Diaphragmatic Breathing  - 1 x daily - 7 x weekly - 1 sets - 5 reps - Diaphragmatic Breathing in Child's Pose with Pelvic Floor Relaxation  - 1 x daily - 7 x weekly - 1 sets - 3 reps - 30-60 hold -pelvic tilts in supine x5 reps. Cues and demo for proper technique. S for safety.   SELF CARE: PATIENT EDUCATION:  Education details: PT educated pt on exam findings and new HEP to regulate IAP and down regulate CNS. Person educated: Patient Education method: Explanation, Demonstration, and Handouts Education comprehension: verbalized understanding, returned demonstration, and needs further education  HOME EXERCISE PROGRAM: [email or web address]  ASSESSMENT:  CLINICAL IMPRESSION: Skilled session focused on completing exam (difficulty coordinating breath with PFM contraction without glutes, decr. Rib translation with inhale (post/ant), TTP none and hypomobility in tx spine and sacrum, limited B hip IR/ER). PT progressed to performing HEP IND with cues and no pain reported. The following impairments were noted upon exam: ROM of back [facility], will assess hips next session, postural dysfunction, decr. Strength likely 2/2 subjective reports and gait deviations, nocturia, mixed UI and fecal incontinence. Pt would benefit from skilled PT to improve safety and decr. Pain during all ADLs.   OBJECTIVE IMPAIRMENTS: Abnormal gait, decreased balance, decreased coordination, decreased ROM, decreased strength, hypomobility, increased fascial restrictions, and postural dysfunction.   ACTIVITY LIMITATIONS: carrying, lifting, bending, sitting, standing, squatting, sleeping, transfers, continence, locomotion level, and caring for others  PARTICIPATION LIMITATIONS: meal prep, cleaning, laundry, interpersonal relationship, community activity,  and occupation  PERSONAL FACTORS: 3+ comorbidities: see above are also affecting patient's functional outcome.   REHAB POTENTIAL: Good  CLINICAL DECISION MAKING: Stable/uncomplicated  EVALUATION COMPLEXITY: Low   GOALS: Goals reviewed with patient? Yes  SHORT TERM GOALS: Target date: for all STGs: 05/03/24  Pt will be IND in HEP to improve pain, strength, coordination. Baseline: works out most days of the week with UI and fecal incontinence Goal status: INITIAL  2.  Finish exam and write goals as indicated. Baseline: limited by time constraints Goal status: MET  3.  Pt will demo proper toileting posture to fully empty bladder and reduce straining during bowel movement. Baseline: unable to demo Goal status: INITIAL  4.  Pt will demonstrated improved relaxation and contraction of PFM with coordination of breath to reduce urinary leakage to </=twice/week. Baseline: a few times a week with working out/running and sneezing Goal status: INITIAL   LONG TERM GOALS: Target date: for all LTGs: 05/31/24  Pt will demonstrated improved relaxation and contraction of PFM with coordination of breath to reduce urinary leakage to </=once/week. Baseline: a few times a week with working out/running and sneezing Goal status: INITIAL  2.  Pt will demonstrate improved relaxation and contraction of pelvic floor muscles (PFM) with coordination of breath to climax without difficulty during intercourse with spouse. Baseline: difficulty climaxing Goal status: INITIAL  Goal status: INITIAL  PLAN: Review HEP prn, internal assessment prn, manual therapy, strengthening (glutes before hamstrings?)   PT FREQUENCY: 1x/week  PT DURATION: 8 weeks  PLANNED INTERVENTIONS: 97164- PT Re-evaluation, 97110-Therapeutic exercises, 97530- Therapeutic activity, 97112- Neuromuscular re-education, 97535- Self Care, 02859- Manual therapy, [phone]- Gait training, [phone] (1-2 muscles), 20561 (3+ muscles)- Dry Needling,  Patient/Family education, Balance training, Taping, Joint mobilization, Spinal mobilization, Cryotherapy, Moist heat, and Biofeedback     Geremy Rister L, PT 04/26/2024, 1:18 PM  Delon Pinal, PT,DPT  04/26/24 1:18 PM Phone: [phone] Fax: [phone]

## 2024-05-02 DIAGNOSIS — F411 Generalized anxiety disorder: Secondary | ICD-10-CM | POA: Diagnosis not present

## 2024-05-02 DIAGNOSIS — F900 Attention-deficit hyperactivity disorder, predominantly inattentive type: Secondary | ICD-10-CM | POA: Diagnosis not present

## 2024-05-03 ENCOUNTER — Ambulatory Visit

## 2024-05-03 ENCOUNTER — Other Ambulatory Visit: Payer: Self-pay

## 2024-05-03 DIAGNOSIS — N393 Stress incontinence (female) (male): Secondary | ICD-10-CM | POA: Diagnosis not present

## 2024-05-03 DIAGNOSIS — R293 Abnormal posture: Secondary | ICD-10-CM

## 2024-05-03 DIAGNOSIS — M6281 Muscle weakness (generalized): Secondary | ICD-10-CM

## 2024-05-03 DIAGNOSIS — R2689 Other abnormalities of gait and mobility: Secondary | ICD-10-CM

## 2024-05-03 NOTE — Therapy (Signed)
 OUTPATIENT PHYSICAL THERAPY FEMALE PELVIC TREATMENT    Patient Name: Tamara Mills MRN: [id number] DOB:[birth year], 33 y.o., female Today's Date: 05/03/2024  END OF SESSION:  PT End of Session - 05/03/24 1153     Visit Number 3    Number of Visits 9    Date for Recertification  06/04/24    Authorization Type [employer]    Progress Note Due on Visit 10    PT Start Time 1151    PT Stop Time 1230    PT Time Calculation (min) 39 min    Activity Tolerance Patient tolerated treatment well    Behavior During Therapy WFL for tasks assessed/performed          Past Medical History:  Diagnosis Date   Acne    Allergy    Amenorrhea    Hx of   Anxiety    COVID-19    Eating disorder    ? eating disorder in past ( pt states just running too much)   Hand eczema    History of gestational hypertension 11/30/2019   Hypertension    Nephrotic syndrome 09/08/2020   After pregnancy   Seasonal allergies    Underweight    Hx of   Past Surgical History:  Procedure Laterality Date   WISDOM TOOTH EXTRACTION     Patient Active Problem List   Diagnosis Date Noted   Urinary incontinence 12/12/2023   Screening mammogram for breast cancer 10/06/2023   PCOS (polycystic ovarian syndrome) 03/08/2023   Elevated rheumatoid factor 02/02/2023   Arthralgia 02/01/2023   Dry eye 02/01/2023   Elevated DHEA 01/17/2023   Hirsutism 01/17/2023   Alopecia areata 11/18/2021   Family history of autoimmune disorder 09/04/2020   Essential hypertension 11/04/2017   Family history of sudden cardiac death 07-06-16   Routine general medical examination at a health care facility 08/29/2013   Acne vulgaris 04/28/2012   Anxiety disorder 02/28/2007   DEFORMITY, CAVUS, FOOT, ACQUIRED 02/23/2007    PCP: Dr. Laine Balls  REFERRING PROVIDER: Dr. Laine Balls  REFERRING DIAG: SUI  THERAPY DIAG:  Urinary, incontinence, stress female  Muscle weakness (generalized)  Other abnormalities of gait and  mobility  Abnormal posture  Rationale for Evaluation and Treatment: Rehabilitation  ONSET DATE: 12/2023 referral date but started in high school  SUBJECTIVE:                                                                                                                                                                                           SUBJECTIVE STATEMENT: 10/23: Pt reported she's doing the HEP and feels like they are making a difference  and overall posture improvements.  EVAL:  URINARY FUNCTION: Pt gets up at least once a night to void, she voids every 1-2 hours. Pt denied yeast infections or UTIs, no pain with voiding. She feels like stream is strong. Pt leaks mostly when she's running (hard running or jumping exercises with HIIT workouts and sneezing). Pt has urgency incontinence as well. She has cut down hard running and mainly sticks to strength training, so leakage occurs a few days a week. Wears pads daily (light ones).  BOWEL FUNCTION: Leakage occurs with hard running a few times with bad stomach pain. Pt has a BM once a day and mostly types 3 or 4 except for around her period. Pt with hx of hemorrhoids after pregnancy but does not have issues now.  CORE STABILITY: Pt denied falls on coccyx, MVAs, surgeries or issues with core. Does not notice bulging or doming during core workouts.  SEXUAL FUNCTION: Pt denied issues with intercourse, tampon insertion, or OBGYN exam. Pt issues with climaxing, not sure if it's medication related.   Fluid intake: drinks a half a pot to a pot of coffee a day, water afterwards (tries for 64 oz.), occasional diet coke in the afternoon, occasional wine at night  FUNCTIONAL LIMITATIONS: unable to run hard 2/2 leakage and sneezing.  PERTINENT HISTORY:  Medications for current condition: none Surgeries: none Other: Anxiety, cavus deformity (over pronate), arthralgia, rheumatoid factor, urinary incontinence, hx of preeclampsia (nephrotic syndrome),  PCOS Sexual abuse: No   PAIN:  Are you having pain? No 05/03/24 NPRS scale: 0/10  PRECAUTIONS: None  RED FLAGS: None   WEIGHT BEARING RESTRICTIONS: No  FALLS:  Has patient fallen in last 6 months? No  OCCUPATION: pharmacist (part-time)  ACTIVITY LEVEL : strength training 3-4 days a week, and jogging or HIIT or peloton bike   PLOF: Independent  PATIENT GOALS: To be able to run without urinary or fecal leakage, improve sexual desire, whole body balance    BOWEL MOVEMENT: Pain with bowel movement: No Type of bowel movement:Frequency once a day Fully empty rectum: No most of the time yes, but sometimes no. Leakage: Yes: with hard running                                                     Caused by:  Pads: Yes: light pads daily  Fiber supplement/laxative No  URINATION: Pain with urination: No Fully empty bladder: No most of the time                             Stream: Strong Urgency: Yes  Frequency:during the day every 1-2 hours                                                         Nocturia: Yes: once a night    Leakage: Urge to void, Walking to the bathroom, Coughing, Sneezing, Laughing, Exercise, and Lifting Pads/briefs: Yes: only during heavy workouts   INTERCOURSE:  Ability to have vaginal penetration Yes  Pain with intercourse: none Dryness: No Climax: difficulty  Marinoff Scale: 0/3 Lubricant: yes  PREGNANCY: Number of pregnancies:  2 Vaginal deliveries 2 Tearing Yes: with first pregnancy (believes it was second degree) Episiotomy No C-section deliveries 0 Currently pregnant No  PROLAPSE: None   OBJECTIVE:  Note: Objective measures were completed at Evaluation unless otherwise noted.   COGNITION: Overall cognitive status: Within functional limits for tasks assessed     SENSATION: Light touch: Appears intact  FUNCTIONAL TESTS:  Single leg stance:  Rt: incr. Postural sway  Lt: incr. Postural sway Sit-up test: Squat: Bed  mobility:  GAIT: Assistive device utilized: None Comments: decr. Trunk rot  POSTURE: forward head, increased thoracic kyphosis, and posterior pelvic tilt   LUMBARAROM/PROM: WNL  A/PROM A/PROM  Eval (% available)  Flexion   Extension   Right lateral flexion   Left lateral flexion   Right rotation   Left rotation    (Blank rows = not tested)  LOWER EXTREMITY ROM: all WFL except for limited B hip ER/IR  Active ROM Right eval Left eval  Hip flexion    Hip extension    Hip abduction    Hip adduction    Hip internal rotation    Hip external rotation    Knee flexion    Knee extension    Ankle dorsiflexion    Ankle plantarflexion    Ankle inversion    Ankle eversion     (Blank rows = not tested)  LOWER EXTREMITY MMT:  MMT Right eval Left eval  Hip flexion 3+ 3+  Hip extension    Hip abduction 3+ 3+  Hip adduction 3+ 3+  Hip internal rotation 3+ 4-  Hip external rotation 3+ 4-  Knee flexion 4- 4-  Knee extension 5 5  Ankle dorsiflexion 5 5  Ankle plantarflexion    Ankle inversion    Ankle eversion     (Blank rows = not tested) PALPATION:  General: no TTP during palpation of spine and SIJ while standing  10/16: no TTP over glutes but incr. Tension noted. Tx spine and sacral hypomobility noted. No DR but abdominals took a second to fire with head to chest. Able to perform PFM contraction with external palpation.   PELVIC MMT:   MMT eval  Vaginal   Internal Anal Sphincter   External Anal Sphincter   Puborectalis   Diastasis Recti   (Blank rows = not tested)        TONE:  PROLAPSE:   TODAY'S TREATMENT:                                                                                                                              DATE: 05/03/24    NMR:  Access Code: [email or web address] URL: [email or web address] Date: 05/03/2024 Prepared by: Delon Pinal  Exercises - Supine Angels  - 1 x daily - 7 x weekly - 1 sets - 10 reps - Sidelying  Open Book  - 1 x daily - 7 x weekly - 1 sets - 10 reps - Supine and sidelying Diaphragmatic Breathing  -  1 x daily - 7 x weekly - 1 sets - 5 reps - Diaphragmatic Breathing in Child's Pose with Pelvic Floor Relaxation  - 1 x daily - 7 x weekly - 1 sets - 3 reps - 30-60 hold - Cat Cow  - 1 x daily - 7 x weekly - 1 sets - 5 reps - Clamshell with Resistance  - 1 x daily - 3 x weekly - 3 sets - 10 reps Cues and demo for proper technique. S for safety.   THEREX: Then performed standing activities at counter: front, lat, reverse lunges, squats with towel rolled under heels, RDLs single and double, and single leg squats, single leg heel raises, and hip abd. B genu valgus noted during most activities 2/2 weak B hip abd. Cues for proper technique.    SELF CARE: PATIENT EDUCATION:  Education details: PT educated on new HEP and to perform one barre or pilates class on peloton vs. A strength (HIIT) class once a week and to not run until she can perform SL squat, SL heel raise and hip abd. Without UE support and compensatory strategies.  Person educated: Patient Education method: Explanation, Demonstration, and Handouts texted running program and running strength tests Education comprehension: verbalized understanding, returned demonstration, and needs further education  HOME EXERCISE PROGRAM: [email or web address]  ASSESSMENT:  CLINICAL IMPRESSION: Skilled session focused on progressing HEP to incorporate strength training for the hips to decr. Tension on PFM muscles which can lead to pain and leakage. Pt unable to perform running strength testing activities without genu valgus and UE support at counter, PT educated pt to cease running until she improves strength.  The following impairments were noted upon exam: ROM of back [facility], will assess hips next session, postural dysfunction, decr. Strength likely 2/2 subjective reports and gait deviations, nocturia, mixed UI and fecal incontinence. Pt would benefit from skilled  PT to improve safety and decr. Pain during all ADLs. Held STGs for one week, as pt missed one week 2/2 PT being out.   OBJECTIVE IMPAIRMENTS: Abnormal gait, decreased balance, decreased coordination, decreased ROM, decreased strength, hypomobility, increased fascial restrictions, and postural dysfunction.   ACTIVITY LIMITATIONS: carrying, lifting, bending, sitting, standing, squatting, sleeping, transfers, continence, locomotion level, and caring for others  PARTICIPATION LIMITATIONS: meal prep, cleaning, laundry, interpersonal relationship, community activity, and occupation  PERSONAL FACTORS: 3+ comorbidities: see above are also affecting patient's functional outcome.   REHAB POTENTIAL: Good  CLINICAL DECISION MAKING: Stable/uncomplicated  EVALUATION COMPLEXITY: Low   GOALS: Goals reviewed with patient? Yes  SHORT TERM GOALS: Target date: for all STGs: 05/03/24  Pt will be IND in HEP to improve pain, strength, coordination. Baseline: works out most days of the week with UI and fecal incontinence Goal status: INITIAL  2.  Finish exam and write goals as indicated. Baseline: limited by time constraints Goal status: MET  3.  Pt will demo proper toileting posture to fully empty bladder and reduce straining during bowel movement. Baseline: unable to demo Goal status: INITIAL  4.  Pt will demonstrated improved relaxation and contraction of PFM with coordination of breath to reduce urinary leakage to </=twice/week. Baseline: a few times a week with working out/running and sneezing Goal status: INITIAL   LONG TERM GOALS: Target date: for all LTGs: 05/31/24  Pt will demonstrated improved relaxation and contraction of PFM with coordination of breath to reduce urinary leakage to </=once/week. Baseline: a few times a week with working out/running and sneezing Goal status: INITIAL  2.  Pt  will demonstrate improved relaxation and contraction of pelvic floor muscles (PFM) with  coordination of breath to climax without difficulty during intercourse with spouse. Baseline: difficulty climaxing Goal status: INITIAL  Goal status: INITIAL  PLAN: progress strengthening, meditate/mindfulness, assess feet, and do glutes fire before hamstrings? Review HEP prn, internal assessment prn, manual therapy   PT FREQUENCY: 1x/week  PT DURATION: 8 weeks  PLANNED INTERVENTIONS: 97164- PT Re-evaluation, 97110-Therapeutic exercises, 97530- Therapeutic activity, 97112- Neuromuscular re-education, 97535- Self Care, [id number]- Manual therapy, [phone]- Gait training, [phone] (1-2 muscles), 20561 (3+ muscles)- Dry Needling, Patient/Family education, Balance training, Taping, Joint mobilization, Spinal mobilization, Cryotherapy, Moist heat, and Biofeedback     Kimberley Dastrup L, PT 05/03/2024, 11:54 AM  Delon Pinal, PT,DPT 05/03/24 11:54 AM Phone: [phone] Fax: [phone]

## 2024-05-09 DIAGNOSIS — F411 Generalized anxiety disorder: Secondary | ICD-10-CM | POA: Diagnosis not present

## 2024-05-09 DIAGNOSIS — F900 Attention-deficit hyperactivity disorder, predominantly inattentive type: Secondary | ICD-10-CM | POA: Diagnosis not present

## 2024-05-14 DIAGNOSIS — F411 Generalized anxiety disorder: Secondary | ICD-10-CM | POA: Diagnosis not present

## 2024-05-14 DIAGNOSIS — F900 Attention-deficit hyperactivity disorder, predominantly inattentive type: Secondary | ICD-10-CM | POA: Diagnosis not present

## 2024-05-15 ENCOUNTER — Ambulatory Visit

## 2024-05-22 ENCOUNTER — Encounter

## 2024-05-23 DIAGNOSIS — F900 Attention-deficit hyperactivity disorder, predominantly inattentive type: Secondary | ICD-10-CM | POA: Diagnosis not present

## 2024-05-23 DIAGNOSIS — F902 Attention-deficit hyperactivity disorder, combined type: Secondary | ICD-10-CM | POA: Diagnosis not present

## 2024-05-23 DIAGNOSIS — F411 Generalized anxiety disorder: Secondary | ICD-10-CM | POA: Diagnosis not present

## 2024-05-25 DIAGNOSIS — D2262 Melanocytic nevi of left upper limb, including shoulder: Secondary | ICD-10-CM | POA: Diagnosis not present

## 2024-05-25 DIAGNOSIS — D225 Melanocytic nevi of trunk: Secondary | ICD-10-CM | POA: Diagnosis not present

## 2024-05-25 DIAGNOSIS — D2272 Melanocytic nevi of left lower limb, including hip: Secondary | ICD-10-CM | POA: Diagnosis not present

## 2024-05-25 DIAGNOSIS — D2261 Melanocytic nevi of right upper limb, including shoulder: Secondary | ICD-10-CM | POA: Diagnosis not present

## 2024-05-29 ENCOUNTER — Ambulatory Visit: Attending: Family Medicine

## 2024-05-29 ENCOUNTER — Other Ambulatory Visit: Payer: Self-pay

## 2024-05-29 DIAGNOSIS — R2689 Other abnormalities of gait and mobility: Secondary | ICD-10-CM | POA: Diagnosis not present

## 2024-05-29 DIAGNOSIS — M6281 Muscle weakness (generalized): Secondary | ICD-10-CM | POA: Diagnosis not present

## 2024-05-29 DIAGNOSIS — N393 Stress incontinence (female) (male): Secondary | ICD-10-CM | POA: Diagnosis not present

## 2024-05-29 DIAGNOSIS — R293 Abnormal posture: Secondary | ICD-10-CM | POA: Insufficient documentation

## 2024-05-29 NOTE — Therapy (Signed)
 OUTPATIENT PHYSICAL THERAPY FEMALE PELVIC TREATMENT    Patient Name: Tamara Mills MRN: [id number] DOB:1991-05-05, 33 y.o., female Today's Date: 05/29/2024  END OF SESSION:  PT End of Session - 05/29/24 0847     Visit Number 4    Number of Visits 9    Date for Recertification  06/04/24    Authorization Type [employer]    Progress Note Due on Visit 10    PT Start Time 0845    PT Stop Time 0925    PT Time Calculation (min) 40 min    Activity Tolerance Patient tolerated treatment well    Behavior During Therapy [facility] for tasks assessed/performed          Past Medical History:  Diagnosis Date   Acne    Allergy    Amenorrhea    Hx of   Anxiety    COVID-19    Eating disorder    ? eating disorder in past ( pt states just running too much)   Hand eczema    History of gestational hypertension 11/30/2019   Hypertension    Nephrotic syndrome 09/08/2020   After pregnancy   Seasonal allergies    Underweight    Hx of   Past Surgical History:  Procedure Laterality Date   WISDOM TOOTH EXTRACTION     Patient Active Problem List   Diagnosis Date Noted   Urinary incontinence 12/12/2023   Screening mammogram for breast cancer 10/06/2023   PCOS (polycystic ovarian syndrome) 03/08/2023   Elevated rheumatoid factor 02/02/2023   Arthralgia 02/01/2023   Dry eye 02/01/2023   Elevated DHEA 01/17/2023   Hirsutism 01/17/2023   Alopecia areata 11/18/2021   Family history of autoimmune disorder 09/04/2020   Essential hypertension 11/04/2017   Family history of sudden cardiac death 07/01/16   Routine general medical examination at a health care facility 08/29/2013   Acne vulgaris 04/28/2012   Anxiety disorder 02/28/2007   DEFORMITY, CAVUS, FOOT, ACQUIRED 02/23/2007    PCP: Dr. Laine Balls  REFERRING PROVIDER: Dr. Laine Balls  REFERRING DIAG: SUI  THERAPY DIAG:  Urinary, incontinence, stress female  Muscle weakness (generalized)  Other abnormalities of gait and  mobility  Abnormal posture  Rationale for Evaluation and Treatment: Rehabilitation  ONSET DATE: 12/2023 referral date but started in high school  SUBJECTIVE:                                                                                                                                                                                           SUBJECTIVE STATEMENT: 11/18: Pt has been doing well overall. More cognizant of breath holding. Not running yet.  She's been doing strength and barre about 2-3x/week. Performing HEP daily. Pt hasn't had leakage (has not performed HIIT, jumping).   EVAL:  URINARY FUNCTION: Pt gets up at least once a night to void, she voids every 1-2 hours. Pt denied yeast infections or UTIs, no pain with voiding. She feels like stream is strong. Pt leaks mostly when she's running (hard running or jumping exercises with HIIT workouts and sneezing). Pt has urgency incontinence as well. She has cut down hard running and mainly sticks to strength training, so leakage occurs a few days a week. Wears pads daily (light ones).  BOWEL FUNCTION: Leakage occurs with hard running a few times with bad stomach pain. Pt has a BM once a day and mostly types 3 or 4 except for around her period. Pt with hx of hemorrhoids after pregnancy but does not have issues now.  CORE STABILITY: Pt denied falls on coccyx, MVAs, surgeries or issues with core. Does not notice bulging or doming during core workouts.  SEXUAL FUNCTION: Pt denied issues with intercourse, tampon insertion, or OBGYN exam. Pt issues with climaxing, not sure if it's medication related.   Fluid intake: drinks a half a pot to a pot of coffee a day, water afterwards (tries for 64 oz.), occasional diet coke in the afternoon, occasional wine at night  FUNCTIONAL LIMITATIONS: unable to run hard 2/2 leakage and sneezing.  PERTINENT HISTORY:  Medications for current condition: none Surgeries: none Other: Anxiety, cavus deformity (over  pronate), arthralgia, rheumatoid factor, urinary incontinence, hx of preeclampsia (nephrotic syndrome), PCOS Sexual abuse: No   PAIN:  Are you having pain? No 05/29/24 NPRS scale: 0/10  PRECAUTIONS: None  RED FLAGS: None   WEIGHT BEARING RESTRICTIONS: No  FALLS:  Has patient fallen in last 6 months? No  OCCUPATION: pharmacist (part-time)  ACTIVITY LEVEL : strength training 3-4 days a week, and jogging or HIIT or peloton bike   PLOF: Independent  PATIENT GOALS: To be able to run without urinary or fecal leakage, improve sexual desire, whole body balance    BOWEL MOVEMENT: Pain with bowel movement: No Type of bowel movement:Frequency once a day Fully empty rectum: No most of the time yes, but sometimes no. Leakage: Yes: with hard running                                                     Caused by:  Pads: Yes: light pads daily  Fiber supplement/laxative No  URINATION: Pain with urination: No Fully empty bladder: No most of the time                             Stream: Strong Urgency: Yes  Frequency:during the day every 1-2 hours                                                         Nocturia: Yes: once a night    Leakage: Urge to void, Walking to the bathroom, Coughing, Sneezing, Laughing, Exercise, and Lifting Pads/briefs: Yes: only during heavy workouts   INTERCOURSE:  Ability to have vaginal penetration Yes  Pain with  intercourse: none Dryness: No Climax: difficulty  Marinoff Scale: 0/3 Lubricant: yes  PREGNANCY: Number of pregnancies: 2 Vaginal deliveries 2 Tearing Yes: with first pregnancy (believes it was second degree) Episiotomy No C-section deliveries 0 Currently pregnant No  PROLAPSE: None   OBJECTIVE:  Note: Objective measures were completed at Evaluation unless otherwise noted.   COGNITION: Overall cognitive status: Within functional limits for tasks assessed     SENSATION: Light touch: Appears intact  FUNCTIONAL TESTS:  Single leg  stance:  Rt: incr. Postural sway  Lt: incr. Postural sway Sit-up test: Squat: Bed mobility:  GAIT: Assistive device utilized: None Comments: decr. Trunk rot  POSTURE: forward head, increased thoracic kyphosis, and posterior pelvic tilt   LUMBARAROM/PROM: WNL  A/PROM A/PROM  Eval (% available)  Flexion   Extension   Right lateral flexion   Left lateral flexion   Right rotation   Left rotation    (Blank rows = not tested)  LOWER EXTREMITY ROM: all WFL except for limited B hip ER/IR  Active ROM Right eval Left eval  Hip flexion    Hip extension    Hip abduction    Hip adduction    Hip internal rotation    Hip external rotation    Knee flexion    Knee extension    Ankle dorsiflexion    Ankle plantarflexion    Ankle inversion    Ankle eversion     (Blank rows = not tested)  LOWER EXTREMITY MMT:  MMT Right eval Left eval  Hip flexion 3+ 3+  Hip extension    Hip abduction 3+ 3+  Hip adduction 3+ 3+  Hip internal rotation 3+ 4-  Hip external rotation 3+ 4-  Knee flexion 4- 4-  Knee extension 5 5  Ankle dorsiflexion 5 5  Ankle plantarflexion    Ankle inversion    Ankle eversion     (Blank rows = not tested) PALPATION:  General: no TTP during palpation of spine and SIJ while standing  10/16: no TTP over glutes but incr. Tension noted. Tx spine and sacral hypomobility noted. No DR but abdominals took a second to fire with head to chest. Able to perform PFM contraction with external palpation.   PELVIC MMT:   MMT eval  Vaginal   Internal Anal Sphincter   External Anal Sphincter   Puborectalis   Diastasis Recti   (Blank rows = not tested)        TONE:  PROLAPSE:   TODAY'S TREATMENT:                                                                                                                              DATE: 05/29/24    Therex:  Access Code: [email or web address] URL: [email or web address] Date: 05/29/2024 Prepared by: Delon Pinal  Exercises - Clamshell with Resistance  - 1 x daily - 3 x weekly - 3 sets - 10 reps with 10# weights on upper thigh -  Seated Ankle Alphabet  - 1 x daily - 7 x weekly - 1 sets - 1 reps - Seated Ankle Eversion with Anchored Resistance  - 1 x daily - 3 x weekly - 3 sets - 10 reps - Towel Scrunches  - 1 x daily - 7 x weekly - 1 sets - 10 reps Cues and demo for proper technique. S for safety.   MANUAL THERAPY: PT assessed ankle and foot ROM, limited second metatarsal AP range (B) and limited R eversion and DF, as well as strength in B ankles. PT performed grade 3-4 PA jt mobs to B second/third metatarsals and PA dorsiflexion B, then pt amb. 150' with improved gait and pt reported improved toe off.  SELF CARE: PATIENT EDUCATION:  Education details: PT educated on goal progress, ankle/foot findings and new HEP. Person educated: Patient Education method: Explanation, Demonstration, and Handouts texted running program and running strength tests Education comprehension: verbalized understanding, returned demonstration, and needs further education  HOME EXERCISE PROGRAM: [email or web address]  ASSESSMENT:  CLINICAL IMPRESSION: Skilled session focused on assessing goals (met and partially met) for recert, and assessing ankle/foot as this can impact gait, posture, and PFM tension. Ankle weakness and decr. Eversion and DF noted. Gait improved after manual therapy and therex. The following impairments were noted upon exam: ROM of back [facility], will assess hips next session, postural dysfunction, decr. Strength , nocturia, mixed UI and fecal incontinence. Pt would benefit from skilled PT to improve safety and decr. Pain during all ADLs. PT requesting add'l visits to meet unmet goals.   OBJECTIVE IMPAIRMENTS: Abnormal gait, decreased balance, decreased coordination, decreased ROM, decreased strength, hypomobility, increased fascial restrictions, and postural dysfunction.   ACTIVITY LIMITATIONS: carrying, lifting,  bending, sitting, standing, squatting, sleeping, transfers, continence, locomotion level, and caring for others  PARTICIPATION LIMITATIONS: meal prep, cleaning, laundry, interpersonal relationship, community activity, and occupation  PERSONAL FACTORS: 3+ comorbidities: see above are also affecting patient's functional outcome.   REHAB POTENTIAL: Good  CLINICAL DECISION MAKING: Stable/uncomplicated  EVALUATION COMPLEXITY: Low   GOALS: Goals reviewed with patient? Yes  SHORT TERM GOALS: Target date: for all STGs: 05/03/24  Pt will be IND in HEP to improve pain, strength, coordination. Baseline: works out most days of the week with UI and fecal incontinence. 11/18: performing daily  Goal status: INITIAL  2.  Finish exam and write goals as indicated. Baseline: limited by time constraints Goal status: MET  3.  Pt will demo proper toileting posture to fully empty bladder and reduce straining during bowel movement. Baseline: unable to demo 11/18: performing  Goal status: MET  4.  Pt will demonstrated improved relaxation and contraction of PFM with coordination of breath to reduce urinary leakage to </=twice/week. Baseline: a few times a week with working out/running and sneezing. 11/18: once with sneezing but not running. Goal status: PARTIALLY MET    LONG TERM GOALS: Target date: for all LTGs: 05/31/24  Pt will demonstrated improved relaxation and contraction of PFM with coordination of breath to reduce urinary leakage to </=once/week. Baseline: a few times a week with working out/running and sneezing 11/18: once with sneezing but not running.  Goal status: PARTIALLY MET  2.  Pt will demonstrate improved relaxation and contraction of pelvic floor muscles (PFM) with coordination of breath to climax without difficulty during intercourse with spouse. Baseline: difficulty climaxing 11/18: still holding breath and difficulty relaxing Goal status: PARTIALLY MET    PLAN:reassess  glutes fire before hamstrings? Review HEP prn, internal assessment prn,  manual therapy   PT FREQUENCY: 1x/week  PT DURATION: 8 weeks  PLANNED INTERVENTIONS: 97164- PT Re-evaluation, 97110-Therapeutic exercises, 97530- Therapeutic activity, 97112- Neuromuscular re-education, 97535- Self Care, [id number]- Manual therapy, [phone]- Gait training, [phone] (1-2 muscles), [id number] (3+ muscles)- Dry Needling, Patient/Family education, Balance training, Taping, Joint mobilization, Spinal mobilization, Cryotherapy, Moist heat, and Biofeedback     Corban Kistler L, PT 05/29/2024, 8:48 AM  Delon Pinal, PT,DPT 05/29/24 8:48 AM Phone: [phone] Fax: [phone]

## 2024-05-31 DIAGNOSIS — F900 Attention-deficit hyperactivity disorder, predominantly inattentive type: Secondary | ICD-10-CM | POA: Diagnosis not present

## 2024-05-31 DIAGNOSIS — F411 Generalized anxiety disorder: Secondary | ICD-10-CM | POA: Diagnosis not present

## 2024-06-05 ENCOUNTER — Ambulatory Visit

## 2024-06-12 ENCOUNTER — Ambulatory Visit

## 2024-06-12 ENCOUNTER — Other Ambulatory Visit: Payer: Self-pay

## 2024-06-12 DIAGNOSIS — M6281 Muscle weakness (generalized): Secondary | ICD-10-CM | POA: Insufficient documentation

## 2024-06-12 DIAGNOSIS — R293 Abnormal posture: Secondary | ICD-10-CM | POA: Diagnosis not present

## 2024-06-12 DIAGNOSIS — N393 Stress incontinence (female) (male): Secondary | ICD-10-CM | POA: Insufficient documentation

## 2024-06-12 DIAGNOSIS — R2689 Other abnormalities of gait and mobility: Secondary | ICD-10-CM | POA: Insufficient documentation

## 2024-06-12 NOTE — Patient Instructions (Signed)
 Back stretch: place folded pillow under sacrum/tailbone and round spine (c shape) and place hands under knees and breathe into back to lengthen spine. Hold for 30-60 seconds.

## 2024-06-12 NOTE — Therapy (Addendum)
 OUTPATIENT PHYSICAL THERAPY FEMALE PELVIC TREATMENT    Patient Name: Tamara Mills MRN: [id number] DOB:[birth year], 33 y.o., female Today's Date: 06/12/2024  END OF SESSION:  PT End of Session - 06/12/24 0849     Visit Number 5    Number of Visits 9    Date for Recertification  06/04/24    Authorization Type [employer]    Progress Note Due on Visit 10    PT Start Time 0846    PT Stop Time 0926    PT Time Calculation (min) 40 min    Activity Tolerance Patient tolerated treatment well    Behavior During Therapy [facility] for tasks assessed/performed          Past Medical History:  Diagnosis Date   Acne    Allergy    Amenorrhea    Hx of   Anxiety    COVID-19    Eating disorder    ? eating disorder in past ( pt states just running too much)   Hand eczema    History of gestational hypertension 11/30/2019   Hypertension    Nephrotic syndrome 09/08/2020   After pregnancy   Seasonal allergies    Underweight    Hx of   Past Surgical History:  Procedure Laterality Date   WISDOM TOOTH EXTRACTION     Patient Active Problem List   Diagnosis Date Noted   Urinary incontinence 12/12/2023   Screening mammogram for breast cancer 10/06/2023   PCOS (polycystic ovarian syndrome) 03/08/2023   Elevated rheumatoid factor 02/02/2023   Arthralgia 02/01/2023   Dry eye 02/01/2023   Elevated DHEA 01/17/2023   Hirsutism 01/17/2023   Alopecia areata 11/18/2021   Family history of autoimmune disorder 09/04/2020   Essential hypertension 11/04/2017   Family history of sudden cardiac death 06/28/2016   Routine general medical examination at a health care facility 08/29/2013   Acne vulgaris 04/28/2012   Anxiety disorder 02/28/2007   DEFORMITY, CAVUS, FOOT, ACQUIRED 02/23/2007    PCP: Dr. Laine Balls  REFERRING PROVIDER: Dr. Laine Balls  REFERRING DIAG: SUI  THERAPY DIAG:  Urinary, incontinence, stress female  Muscle weakness (generalized)  Other abnormalities of gait and  mobility  Abnormal posture  Rationale for Evaluation and Treatment: Rehabilitation  ONSET DATE: 12/2023 referral date but started in high school  SUBJECTIVE:                                                                                                                                                                                           SUBJECTIVE STATEMENT: Pt reported she's trying to do more and keep up with exercises even over the  holidays. She did one run last week, 7:30 mile pace, 2.3 miles. No incontinence while running.   EVAL:  URINARY FUNCTION: Pt gets up at least once a night to void, she voids every 1-2 hours. Pt denied yeast infections or UTIs, no pain with voiding. She feels like stream is strong. Pt leaks mostly when she's running (hard running or jumping exercises with HIIT workouts and sneezing). Pt has urgency incontinence as well. She has cut down hard running and mainly sticks to strength training, so leakage occurs a few days a week. Wears pads daily (light ones).  BOWEL FUNCTION: Leakage occurs with hard running a few times with bad stomach pain. Pt has a BM once a day and mostly types 3 or 4 except for around her period. Pt with hx of hemorrhoids after pregnancy but does not have issues now.  CORE STABILITY: Pt denied falls on coccyx, MVAs, surgeries or issues with core. Does not notice bulging or doming during core workouts.  SEXUAL FUNCTION: Pt denied issues with intercourse, tampon insertion, or OBGYN exam. Pt issues with climaxing, not sure if it's medication related.   Fluid intake: drinks a half a pot to a pot of coffee a day, water afterwards (tries for 64 oz.), occasional diet coke in the afternoon, occasional wine at night  FUNCTIONAL LIMITATIONS: unable to run hard 2/2 leakage and sneezing.  PERTINENT HISTORY:  Medications for current condition: none Surgeries: none Other: Anxiety, cavus deformity (over pronate), arthralgia, rheumatoid factor, urinary  incontinence, hx of preeclampsia (nephrotic syndrome), PCOS Sexual abuse: No   PAIN:  Are you having pain? No 06/12/24 NPRS scale: 0/10  PRECAUTIONS: None  RED FLAGS: None   WEIGHT BEARING RESTRICTIONS: No  FALLS:  Has patient fallen in last 6 months? No  OCCUPATION: pharmacist (part-time)  ACTIVITY LEVEL : strength training 3-4 days a week, and jogging or HIIT or peloton bike   PLOF: Independent  PATIENT GOALS: To be able to run without urinary or fecal leakage, improve sexual desire, whole body balance    BOWEL MOVEMENT: Pain with bowel movement: No Type of bowel movement:Frequency once a day Fully empty rectum: No most of the time yes, but sometimes no. Leakage: Yes: with hard running                                                     Caused by:  Pads: Yes: light pads daily  Fiber supplement/laxative No  URINATION: Pain with urination: No Fully empty bladder: No most of the time                             Stream: Strong Urgency: Yes  Frequency:during the day every 1-2 hours                                                         Nocturia: Yes: once a night    Leakage: Urge to void, Walking to the bathroom, Coughing, Sneezing, Laughing, Exercise, and Lifting Pads/briefs: Yes: only during heavy workouts   INTERCOURSE:  Ability to have vaginal penetration Yes  Pain with intercourse: none Dryness: No  Climax: difficulty  Marinoff Scale: 0/3 Lubricant: yes  PREGNANCY: Number of pregnancies: 2 Vaginal deliveries 2 Tearing Yes: with first pregnancy (believes it was second degree) Episiotomy No C-section deliveries 0 Currently pregnant No  PROLAPSE: None   OBJECTIVE:  Note: Objective measures were completed at Evaluation unless otherwise noted.   COGNITION: Overall cognitive status: Within functional limits for tasks assessed     SENSATION: Light touch: Appears intact  FUNCTIONAL TESTS:  Single leg stance:  Rt: incr. Postural sway  Lt: incr.  Postural sway Sit-up test: Squat: Bed mobility:  GAIT: Assistive device utilized: None Comments: decr. Trunk rot  POSTURE: forward head, increased thoracic kyphosis, and posterior pelvic tilt   LUMBARAROM/PROM: WNL  A/PROM A/PROM  Eval (% available)  Flexion   Extension   Right lateral flexion   Left lateral flexion   Right rotation   Left rotation    (Blank rows = not tested)  LOWER EXTREMITY ROM: all WFL except for limited B hip ER/IR  Active ROM Right eval Left eval  Hip flexion    Hip extension    Hip abduction    Hip adduction    Hip internal rotation    Hip external rotation    Knee flexion    Knee extension    Ankle dorsiflexion    Ankle plantarflexion    Ankle inversion    Ankle eversion     (Blank rows = not tested)  LOWER EXTREMITY MMT:  MMT Right eval Left eval  Hip flexion 3+ 3+  Hip extension    Hip abduction 3+ 3+  Hip adduction 3+ 3+  Hip internal rotation 3+ 4-  Hip external rotation 3+ 4-  Knee flexion 4- 4-  Knee extension 5 5  Ankle dorsiflexion 5 5  Ankle plantarflexion    Ankle inversion    Ankle eversion     (Blank rows = not tested) PALPATION:  General: no TTP during palpation of spine and SIJ while standing  10/16: no TTP over glutes but incr. Tension noted. Tx spine and sacral hypomobility noted. No DR but abdominals took a second to fire with head to chest. Able to perform PFM contraction with external palpation.   PELVIC MMT:   MMT eval  Vaginal   Internal Anal Sphincter   External Anal Sphincter   Puborectalis   Diastasis Recti   (Blank rows = not tested)        TONE:  PROLAPSE:   TODAY'S TREATMENT:                                                                                                                              DATE: 06/11/24    Therex:  Access Code: [email or web address] URL: [email or web address] Date: 06/12/2024 Prepared by: Delon Pinal  Exercises - Standing Gluteal Sets  - 1 x  daily - 7 x weekly - 1 sets - 10 reps and performed in prone with PT palpating, with hamstrings firing first  without cues. - Quadruped Pelvic Tilts With Transverse Abdominis and Pelvic Floor Contraction  - 1 x daily - 7 x weekly - 1 sets - 5-10 reps, lat/post/ant and clocks - Quadruped Adductor Stretch  - 1 x daily - 7 x weekly - 1 sets - 5-10 reps with and without LE straight out to the side -squats with cues to decr. Ant pelvic tilt and then post. Pelvic tilt tuck at end range (towels under heels 2/2 decr. Ankle DF). Cues and demo for proper technique. S for safety. No pain reported.  MANUAL THERAPY: PT performed STM and gentle cupping to tx/lx paraspinals, followed by seated rounded back stretch (in pt instructions) with pt reporting decr. Tension.  SELF CARE: PATIENT EDUCATION:  Education details: PT educated on goal progress, and new HEP to improve hip ROM/relaxation and libido prior to intercourse. Person educated: Patient Education method: Explanation, Demonstration, and Handouts texted running program and running strength tests Education comprehension: verbalized understanding, returned demonstration, and needs further education  HOME EXERCISE PROGRAM: [email or web address]  ASSESSMENT:  CLINICAL IMPRESSION: Skilled session focused on assessing goals  (LTGs partially met) and adding to HEP. Cues still required for glutes to fire prior to hamstrings but progressed to IND. Progress made as pt reported no leakage during tabata workout and running 2.3 miles. The following impairments were noted upon exam: ROM of back [facility] will assess hips next session, postural dysfunction, decr. Strength , nocturia, mixed UI and fecal incontinence. Pt would benefit from skilled PT to improve safety and decr. Pain during all ADLs. PT requesting add'l visits to meet unmet goals.   OBJECTIVE IMPAIRMENTS: Abnormal gait, decreased balance, decreased coordination, decreased ROM, decreased strength, hypomobility, increased  fascial restrictions, and postural dysfunction.   ACTIVITY LIMITATIONS: carrying, lifting, bending, sitting, standing, squatting, sleeping, transfers, continence, locomotion level, and caring for others  PARTICIPATION LIMITATIONS: meal prep, cleaning, laundry, interpersonal relationship, community activity, and occupation  PERSONAL FACTORS: 3+ comorbidities: see above are also affecting patient's functional outcome.   REHAB POTENTIAL: Good  CLINICAL DECISION MAKING: Stable/uncomplicated  EVALUATION COMPLEXITY: Low   GOALS: Goals reviewed with patient? Yes  SHORT TERM GOALS: Target date: for all STGs: 05/03/24  Pt will be IND in HEP to improve pain, strength, coordination. Baseline: works out most days of the week with UI and fecal incontinence. 11/18: performing daily  Goal status: MET  2.  Finish exam and write goals as indicated. Baseline: limited by time constraints Goal status: MET  3.  Pt will demo proper toileting posture to fully empty bladder and reduce straining during bowel movement. Baseline: unable to demo 11/18: performing  Goal status: MET  4.  Pt will demonstrated improved relaxation and contraction of PFM with coordination of breath to reduce urinary leakage to </=twice/week. Baseline: a few times a week with working out/running and sneezing. 11/18: once with sneezing but not running. Goal status:PARTIALLY MET    LONG TERM GOALS: Target date: for all LTGs: 05/31/24  Pt will demonstrated improved relaxation and contraction of PFM with coordination of breath to reduce urinary leakage to </=once/week. Baseline: a few times a week with working out/running and sneezing 11/18: once with sneezing but not running. 12/1: none so far, during running or tabata but not full pace yet with exercise Goal status: PARTIALLY MET  2.  Pt will demonstrate improved relaxation and contraction of pelvic floor muscles (PFM) with coordination of breath to climax without difficulty  during intercourse with spouse. Baseline: difficulty climaxing 11/18: still holding breath and  difficulty relaxing. 12/1: she feels better, approx. 50% better since starting PHPT Goal status: PARTIALLY MET    PLAN: reassess glutes fire before hamstrings? Review HEP prn, internal assessment prn, manual therapy   PT FREQUENCY: 1x/week  PT DURATION: 8 weeks  PLANNED INTERVENTIONS: 97164- PT Re-evaluation, 97110-Therapeutic exercises, 97530- Therapeutic activity, 97112- Neuromuscular re-education, 97535- Self Care, [id number]- Manual therapy, [phone]- Gait training, [phone] (1-2 muscles), [phone] (3+ muscles)- Dry Needling, Patient/Family education, Balance training, Taping, Joint mobilization, Spinal mobilization, Cryotherapy, Moist heat, and Biofeedback     Massiah Longanecker L, PT 06/12/2024, 8:50 AM  Delon Pinal, PT,DPT 06/12/24 8:50 AM Phone: [phone] Fax: [phone]

## 2024-06-14 DIAGNOSIS — F900 Attention-deficit hyperactivity disorder, predominantly inattentive type: Secondary | ICD-10-CM | POA: Diagnosis not present

## 2024-06-14 DIAGNOSIS — F411 Generalized anxiety disorder: Secondary | ICD-10-CM | POA: Diagnosis not present

## 2024-06-21 DIAGNOSIS — F411 Generalized anxiety disorder: Secondary | ICD-10-CM | POA: Diagnosis not present

## 2024-06-21 DIAGNOSIS — F900 Attention-deficit hyperactivity disorder, predominantly inattentive type: Secondary | ICD-10-CM | POA: Diagnosis not present

## 2024-06-26 ENCOUNTER — Other Ambulatory Visit: Payer: Self-pay

## 2024-06-26 ENCOUNTER — Ambulatory Visit

## 2024-06-26 DIAGNOSIS — M6281 Muscle weakness (generalized): Secondary | ICD-10-CM | POA: Diagnosis not present

## 2024-06-26 DIAGNOSIS — N393 Stress incontinence (female) (male): Secondary | ICD-10-CM

## 2024-06-26 DIAGNOSIS — R2689 Other abnormalities of gait and mobility: Secondary | ICD-10-CM | POA: Diagnosis not present

## 2024-06-26 DIAGNOSIS — R293 Abnormal posture: Secondary | ICD-10-CM | POA: Diagnosis not present

## 2024-06-26 NOTE — Therapy (Signed)
 OUTPATIENT PHYSICAL THERAPY FEMALE PELVIC TREATMENT    Patient Name: Tamara Mills MRN: [id number] DOB:Apr 27, 1991, 33 y.o., female Today's Date: 06/26/2024  END OF SESSION:  PT End of Session - 06/26/24 0839     Visit Number 6    Number of Visits 9    Date for Recertification  06/04/24    Authorization Type [employer]    Progress Note Due on Visit 10    PT Start Time [phone]    PT Stop Time 0915    PT Time Calculation (min) 39 min    Activity Tolerance Patient tolerated treatment well    Behavior During Therapy [facility] for tasks assessed/performed          Past Medical History:  Diagnosis Date   Acne    Allergy    Amenorrhea    Hx of   Anxiety    COVID-19    Eating disorder    ? eating disorder in past ( pt states just running too much)   Hand eczema    History of gestational hypertension 11/30/2019   Hypertension    Nephrotic syndrome 09/08/2020   After pregnancy   Seasonal allergies    Underweight    Hx of   Past Surgical History:  Procedure Laterality Date   WISDOM TOOTH EXTRACTION     Patient Active Problem List   Diagnosis Date Noted   Urinary incontinence 12/12/2023   Screening mammogram for breast cancer 10/06/2023   PCOS (polycystic ovarian syndrome) 03/08/2023   Elevated rheumatoid factor 02/02/2023   Arthralgia 02/01/2023   Dry eye 02/01/2023   Elevated DHEA 01/17/2023   Hirsutism 01/17/2023   Alopecia areata 11/18/2021   Family history of autoimmune disorder 09/04/2020   Essential hypertension 11/04/2017   Family history of sudden cardiac death 14-Jul-2016   Routine general medical examination at a health care facility 08/29/2013   Acne vulgaris 04/28/2012   Anxiety disorder 02/28/2007   DEFORMITY, CAVUS, FOOT, ACQUIRED 02/23/2007    PCP: Dr. Laine Balls  REFERRING PROVIDER: Dr. Laine Balls  REFERRING DIAG: SUI  THERAPY DIAG:  Urinary, incontinence, stress female  Muscle weakness (generalized)  Other abnormalities of gait and  mobility  Abnormal posture  Rationale for Evaluation and Treatment: Rehabilitation  ONSET DATE: 12/2023 referral date but started in high school  SUBJECTIVE:                                                                                                                                                                                           SUBJECTIVE STATEMENT: Pt reported glute activation has been really helpful. She did a hard peloton workout and  one more run and no incontinence. She feels more of and impact of the exercises. Feels stronger during single leg stance. Pt leaves for [place] cruise this Friday, 06/29/24.  EVAL:  URINARY FUNCTION: Pt gets up at least once a night to void, she voids every 1-2 hours. Pt denied yeast infections or UTIs, no pain with voiding. She feels like stream is strong. Pt leaks mostly when she's running (hard running or jumping exercises with HIIT workouts and sneezing). Pt has urgency incontinence as well. She has cut down hard running and mainly sticks to strength training, so leakage occurs a few days a week. Wears pads daily (light ones).  BOWEL FUNCTION: Leakage occurs with hard running a few times with bad stomach pain. Pt has a BM once a day and mostly types 3 or 4 except for around her period. Pt with hx of hemorrhoids after pregnancy but does not have issues now.  CORE STABILITY: Pt denied falls on coccyx, MVAs, surgeries or issues with core. Does not notice bulging or doming during core workouts.  SEXUAL FUNCTION: Pt denied issues with intercourse, tampon insertion, or OBGYN exam. Pt issues with climaxing, not sure if it's medication related.   Fluid intake: drinks a half a pot to a pot of coffee a day, water afterwards (tries for 64 oz.), occasional diet coke in the afternoon, occasional wine at night  FUNCTIONAL LIMITATIONS: unable to run hard 2/2 leakage and sneezing.  PERTINENT HISTORY:  Medications for current condition: none Surgeries:  none Other: Anxiety, cavus deformity (over pronate), arthralgia, rheumatoid factor, urinary incontinence, hx of preeclampsia (nephrotic syndrome), PCOS Sexual abuse: No   PAIN:  Are you having pain? No 06/26/24 NPRS scale: 0/10  PRECAUTIONS: None  RED FLAGS: None   WEIGHT BEARING RESTRICTIONS: No  FALLS:  Has patient fallen in last 6 months? No  OCCUPATION: pharmacist (part-time)  ACTIVITY LEVEL : strength training 3-4 days a week, and jogging or HIIT or peloton bike   PLOF: Independent  PATIENT GOALS: To be able to run without urinary or fecal leakage, improve sexual desire, whole body balance    BOWEL MOVEMENT: Pain with bowel movement: No Type of bowel movement:Frequency once a day Fully empty rectum: No most of the time yes, but sometimes no. Leakage: Yes: with hard running                                                     Caused by:  Pads: Yes: light pads daily  Fiber supplement/laxative No  URINATION: Pain with urination: No Fully empty bladder: No most of the time                             Stream: Strong Urgency: Yes  Frequency:during the day every 1-2 hours                                                         Nocturia: Yes: once a night    Leakage: Urge to void, Walking to the bathroom, Coughing, Sneezing, Laughing, Exercise, and Lifting Pads/briefs: Yes: only during heavy workouts   INTERCOURSE:  Ability  to have vaginal penetration Yes  Pain with intercourse: none Dryness: No Climax: difficulty  Marinoff Scale: 0/3 Lubricant: yes  PREGNANCY: Number of pregnancies: 2 Vaginal deliveries 2 Tearing Yes: with first pregnancy (believes it was second degree) Episiotomy No C-section deliveries 0 Currently pregnant No  PROLAPSE: None   OBJECTIVE:  Note: Objective measures were completed at Evaluation unless otherwise noted.   COGNITION: Overall cognitive status: Within functional limits for tasks assessed     SENSATION: Light touch:  Appears intact  FUNCTIONAL TESTS:  Single leg stance:  Rt: incr. Postural sway  Lt: incr. Postural sway Sit-up test: Squat: Bed mobility:  GAIT: Assistive device utilized: None Comments: decr. Trunk rot  POSTURE: forward head, increased thoracic kyphosis, and posterior pelvic tilt   LUMBARAROM/PROM: WNL  A/PROM A/PROM  Eval (% available)  Flexion   Extension   Right lateral flexion   Left lateral flexion   Right rotation   Left rotation    (Blank rows = not tested)  LOWER EXTREMITY ROM: all WFL except for limited B hip ER/IR  Active ROM Right eval Left eval  Hip flexion    Hip extension    Hip abduction    Hip adduction    Hip internal rotation    Hip external rotation    Knee flexion    Knee extension    Ankle dorsiflexion    Ankle plantarflexion    Ankle inversion    Ankle eversion     (Blank rows = not tested)  LOWER EXTREMITY MMT:  MMT Right eval Left eval  Hip flexion 3+ 3+  Hip extension    Hip abduction 3+ 3+  Hip adduction 3+ 3+  Hip internal rotation 3+ 4-  Hip external rotation 3+ 4-  Knee flexion 4- 4-  Knee extension 5 5  Ankle dorsiflexion 5 5  Ankle plantarflexion    Ankle inversion    Ankle eversion     (Blank rows = not tested)  06/26/24: MMT Right eval Left eval  Hip flexion 3+ 3+  Hip extension    Hip abduction 4 4  Hip adduction 4 4  Hip internal rotation 4+ 4+  Hip external rotation 4+ 4+  Knee flexion 4 4  Knee extension 5 5  Ankle dorsiflexion 5 5  Ankle plantarflexion    Ankle inversion    Ankle eversion      PALPATION:  General: no TTP during palpation of spine and SIJ while standing  10/16: no TTP over glutes but incr. Tension noted. Tx spine and sacral hypomobility noted. No DR but abdominals took a second to fire with head to chest. Able to perform PFM contraction with external palpation.   PELVIC MMT:   MMT eval  Vaginal   Internal Anal Sphincter   External Anal Sphincter   Puborectalis    Diastasis Recti   (Blank rows = not tested)        TONE:  PROLAPSE:   TODAY'S TREATMENT:  DATE: 06/26/24    Therex: PT performed MMT at beginning of session, see above for details. Access Code: [email or web address] URL: [email or web address] Date: 06/26/2024 Prepared by: Delon Pinal  Exercises - Seated, supine, and standing Pelvic Floor Contraction  - 1 x daily - 7 x weekly - 1 sets - 10 reps, performed several reps of quick contractions and then long holds 8-10 seconds, only able to hold for 2-3 sec. In standing against gravity. Cues and demo for proper technique. S for safety. No pain reported.  MANUAL THERAPY: PT performed STM to tx/lx R paraspinals, followed amb and stretching in standing with pt reporting no pain and feeling more loose afterwards.  SELF CARE: PATIENT EDUCATION:  Education details: PT educated on carrying son on both sides vs. L side or in the middle of body. PT educated pt on having partner perform STM to paraspinals. Discussed performing PFM contractions to stop leaking but not on regular basis 2/2 incr. PFM tension. Person educated: Patient Education method: Explanation, Demonstration, and Handouts texted running program and running strength tests Education comprehension: verbalized understanding, returned demonstration, and needs further education  HOME EXERCISE PROGRAM: [email or web address]  ASSESSMENT:  CLINICAL IMPRESSION: Skilled session focused on assessing MMT-improvements in hip strength except for B hip flexors. R paraspinal tension noted likely from carrying son on L side of body, decr. Tension noted after manual therapy. Pt demonstrated progress as no leakage since last visit. The following impairments were noted upon exam: ROM of back [facility], will assess hips next session, postural dysfunction, decr. Strength , nocturia, mixed  UI and fecal incontinence. Pt would benefit from skilled PT to improve safety and decr. Pain during all ADLs.   OBJECTIVE IMPAIRMENTS: Abnormal gait, decreased balance, decreased coordination, decreased ROM, decreased strength, hypomobility, increased fascial restrictions, and postural dysfunction.   ACTIVITY LIMITATIONS: carrying, lifting, bending, sitting, standing, squatting, sleeping, transfers, continence, locomotion level, and caring for others  PARTICIPATION LIMITATIONS: meal prep, cleaning, laundry, interpersonal relationship, community activity, and occupation  PERSONAL FACTORS: 3+ comorbidities: see above are also affecting patient's functional outcome.   REHAB POTENTIAL: Good  CLINICAL DECISION MAKING: Stable/uncomplicated  EVALUATION COMPLEXITY: Low   GOALS: Goals reviewed with patient? Yes  SHORT TERM GOALS: Target date: for all STGs: 05/03/24  Pt will be IND in HEP to improve pain, strength, coordination. Baseline: works out most days of the week with UI and fecal incontinence. 11/18: performing daily  Goal status: MET  2.  Finish exam and write goals as indicated. Baseline: limited by time constraints Goal status: MET  3.  Pt will demo proper toileting posture to fully empty bladder and reduce straining during bowel movement. Baseline: unable to demo 11/18: performing  Goal status: MET  4.  Pt will demonstrated improved relaxation and contraction of PFM with coordination of breath to reduce urinary leakage to </=twice/week. Baseline: a few times a week with working out/running and sneezing. 11/18: once with sneezing but not running. Goal status:PARTIALLY MET    LONG TERM GOALS: Target date: for all LTGs: 05/31/24. NEW TARGET DATE FOR ALL LTGS: 07/12/24  Pt will demonstrated improved relaxation and contraction of PFM with coordination of breath to reduce urinary leakage to </=once/week. Baseline: a few times a week with working out/running and sneezing 11/18:  once with sneezing but not running. 12/1: none so far, during running or tabata but not full pace yet with exercise Goal status: PARTIALLY MET  2.  Pt will demonstrate improved relaxation and contraction of pelvic floor muscles (PFM) with coordination  of breath to climax without difficulty during intercourse with spouse. Baseline: difficulty climaxing 11/18: still holding breath and difficulty relaxing. 12/1: she feels better, approx. 50% better since starting PHPT Goal status: PARTIALLY MET    PLAN:Review HEP prn, internal assessment prn, manual therapy   PT FREQUENCY: 1x/week  PT DURATION: 8 weeks  PLANNED INTERVENTIONS: 97164- PT Re-evaluation, 97110-Therapeutic exercises, 97530- Therapeutic activity, 97112- Neuromuscular re-education, 97535- Self Care, [id number]- Manual therapy, [phone]- Gait training, [phone] (1-2 muscles), [phone] (3+ muscles)- Dry Needling, Patient/Family education, Balance training, Taping, Joint mobilization, Spinal mobilization, Cryotherapy, Moist heat, and Biofeedback     Ebin Palazzi L, PT 06/26/2024, 8:40 AM  Delon Pinal, PT,DPT 06/26/2024 8:40 AM Phone: [phone] Fax: [phone]

## 2024-06-26 NOTE — Patient Instructions (Signed)
 PARASPINALS: perform soft tissue massage to right sided paraspinals muscle (thoracic/lumbar spine) area to decrease overall tension. Carry son on both sides or in the middle of body to decrease back tension.

## 2024-07-03 ENCOUNTER — Encounter

## 2024-07-10 ENCOUNTER — Encounter
